# Patient Record
Sex: Female | Born: 1975 | Race: Black or African American | Hispanic: No | Marital: Single | Smoking: Never smoker
Health system: Southern US, Community
[De-identification: ages and names within clinical notes are randomized; demographics above are authoritative.]

## PROBLEM LIST (undated history)

## (undated) DIAGNOSIS — B009 Herpesviral infection, unspecified: Secondary | ICD-10-CM

## (undated) DIAGNOSIS — D649 Anemia, unspecified: Secondary | ICD-10-CM

## (undated) DIAGNOSIS — R51 Headache: Secondary | ICD-10-CM

## (undated) DIAGNOSIS — Z8051 Family history of malignant neoplasm of kidney: Secondary | ICD-10-CM

## (undated) DIAGNOSIS — M1990 Unspecified osteoarthritis, unspecified site: Secondary | ICD-10-CM

## (undated) DIAGNOSIS — Z803 Family history of malignant neoplasm of breast: Secondary | ICD-10-CM

## (undated) DIAGNOSIS — Z8052 Family history of malignant neoplasm of bladder: Secondary | ICD-10-CM

## (undated) DIAGNOSIS — E785 Hyperlipidemia, unspecified: Secondary | ICD-10-CM

## (undated) DIAGNOSIS — K219 Gastro-esophageal reflux disease without esophagitis: Secondary | ICD-10-CM

## (undated) DIAGNOSIS — S60551A Superficial foreign body of right hand, initial encounter: Secondary | ICD-10-CM

## (undated) DIAGNOSIS — N200 Calculus of kidney: Secondary | ICD-10-CM

## (undated) DIAGNOSIS — I10 Essential (primary) hypertension: Secondary | ICD-10-CM

## (undated) DIAGNOSIS — Z8042 Family history of malignant neoplasm of prostate: Secondary | ICD-10-CM

## (undated) DIAGNOSIS — R87619 Unspecified abnormal cytological findings in specimens from cervix uteri: Secondary | ICD-10-CM

## (undated) DIAGNOSIS — Z8041 Family history of malignant neoplasm of ovary: Secondary | ICD-10-CM

## (undated) DIAGNOSIS — N870 Mild cervical dysplasia: Secondary | ICD-10-CM

## (undated) DIAGNOSIS — E119 Type 2 diabetes mellitus without complications: Secondary | ICD-10-CM

## (undated) DIAGNOSIS — M65839 Other synovitis and tenosynovitis, unspecified forearm: Secondary | ICD-10-CM

## (undated) HISTORY — DX: Family history of malignant neoplasm of bladder: Z80.52

## (undated) HISTORY — DX: Herpesviral infection, unspecified: B00.9

## (undated) HISTORY — DX: Unspecified abnormal cytological findings in specimens from cervix uteri: R87.619

## (undated) HISTORY — DX: Family history of malignant neoplasm of ovary: Z80.41

## (undated) HISTORY — DX: Family history of malignant neoplasm of breast: Z80.3

## (undated) HISTORY — DX: Anemia, unspecified: D64.9

## (undated) HISTORY — DX: Calculus of kidney: N20.0

## (undated) HISTORY — DX: Type 2 diabetes mellitus without complications: E11.9

## (undated) HISTORY — DX: Family history of malignant neoplasm of kidney: Z80.51

## (undated) HISTORY — DX: Hyperlipidemia, unspecified: E78.5

## (undated) HISTORY — DX: Family history of malignant neoplasm of prostate: Z80.42

## (undated) HISTORY — PX: CYST EXCISION: SHX5701

---
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
## 1997-11-05 ENCOUNTER — Emergency Department (HOSPITAL_COMMUNITY): Admission: EM | Admit: 1997-11-05 | Discharge: 1997-11-05 | Payer: Self-pay | Admitting: Emergency Medicine

## 2008-01-04 ENCOUNTER — Emergency Department (HOSPITAL_COMMUNITY): Admission: EM | Admit: 2008-01-04 | Discharge: 2008-01-04 | Payer: Self-pay | Admitting: Family Medicine

## 2011-03-17 ENCOUNTER — Inpatient Hospital Stay (INDEPENDENT_AMBULATORY_CARE_PROVIDER_SITE_OTHER)
Admission: RE | Admit: 2011-03-17 | Discharge: 2011-03-17 | Disposition: A | Discharge status: Home / Self Care | Payer: Worker's Compensation | Source: Ambulatory Visit | Attending: Family Medicine | Admitting: Family Medicine

## 2011-03-17 ENCOUNTER — Ambulatory Visit (INDEPENDENT_AMBULATORY_CARE_PROVIDER_SITE_OTHER): Payer: Worker's Compensation

## 2011-03-17 DIAGNOSIS — S93409A Sprain of unspecified ligament of unspecified ankle, initial encounter: Secondary | ICD-10-CM

## 2011-07-30 DIAGNOSIS — M65839 Other synovitis and tenosynovitis, unspecified forearm: Secondary | ICD-10-CM

## 2011-07-30 DIAGNOSIS — S60551A Superficial foreign body of right hand, initial encounter: Secondary | ICD-10-CM

## 2011-07-30 HISTORY — DX: Other synovitis and tenosynovitis, unspecified forearm: M65.839

## 2011-07-30 HISTORY — DX: Superficial foreign body of right hand, initial encounter: S60.551A

## 2011-08-20 ENCOUNTER — Encounter (HOSPITAL_BASED_OUTPATIENT_CLINIC_OR_DEPARTMENT_OTHER): Payer: Self-pay | Admitting: *Deleted

## 2011-08-20 NOTE — Pre-Procedure Instructions (Signed)
To come for BMET 

## 2011-08-24 ENCOUNTER — Encounter (HOSPITAL_BASED_OUTPATIENT_CLINIC_OR_DEPARTMENT_OTHER): Payer: Self-pay | Admitting: *Deleted

## 2011-08-24 NOTE — Pre-Procedure Instructions (Signed)
To come for BMET and EKG 

## 2011-08-25 ENCOUNTER — Other Ambulatory Visit: Payer: Self-pay

## 2011-08-25 ENCOUNTER — Encounter (HOSPITAL_BASED_OUTPATIENT_CLINIC_OR_DEPARTMENT_OTHER)
Admission: RE | Admit: 2011-08-25 | Discharge: 2011-08-25 | Disposition: A | Discharge status: Home / Self Care | Payer: 59 | Source: Ambulatory Visit | Attending: Orthopedic Surgery | Admitting: Orthopedic Surgery

## 2011-08-25 ENCOUNTER — Other Ambulatory Visit: Payer: Self-pay | Admitting: Orthopedic Surgery

## 2011-08-25 LAB — BASIC METABOLIC PANEL
BUN: 13 mg/dL (ref 6–23)
Calcium: 8.7 mg/dL (ref 8.4–10.5)
GFR calc Af Amer: >90 mL/min (ref >90)
GFR calc non Af Amer: >90 mL/min (ref >90)
Potassium: 4.1 mEq/L (ref 3.5–5.1)
Sodium: 135 mEq/L (ref 135–145)

## 2011-08-25 NOTE — H&P (Signed)
Linda Conrad is an 36 y.o. female.   Chief Complaint: c/o pain radial aspect right wrist and longstanding FB in right palm. HPI: Linda Conrad presents with acute deQuervain's stenosing tenosynovitis of the right wrist and painful metal foreign body in her palm. As a child she had a wire break in her palm overlying the index MCP joint. She has lived with this for many years but has now developed reactive inflammation. She has STS of her right first dorsal compartment that we treated with an injection and splinting in 2009. She now has a cyst in her first dorsal compartment and acute symptoms.    Past Medical History  Diagnosis Date  . Headache     sinus  . Arthritis     hand  . GERD (gastroesophageal reflux disease)     no current med.  . 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 Foreign body of hand, right 07/2011    right MP  . Hypertension     denies HTN, but takes Hyzaar     Past Surgical History  Procedure Date  . No past surgeries     History reviewed. No pertinent family history. Social History:  reports that she has never smoked. She has never used smokeless tobacco. She reports that she drinks alcohol. She reports that she does not use illicit drugs.  Allergies:  Allergies  Allergen Reactions  . Soap Rash    No current facility-administered medications on file as of .   Medications Prior to Admission  Medication Sig Dispense Refill  . carboxymethylcellulose 1 % ophthalmic solution 1 drop 3 (three) times daily.      . fluticasone (VERAMYST) 27.5 MCG/SPRAY nasal spray Place 2 sprays into the nose daily.      Marland Kitchen losartan-hydrochlorothiazide (HYZAAR) 50-12.5 MG per tablet Take 1 tablet by mouth daily. AM      . meloxicam (MOBIC) 15 MG tablet Take 15 mg by mouth daily.      . naproxen sodium (ANAPROX) 220 MG tablet Take 220 mg by mouth 2 (two) times daily with a meal.        No results found for this or any previous visit (from the past 48  hour(s)).  No results found.  Pertinent items are noted in HPI.  Height 5\' 6"  (1.676 m), weight 66.679 kg (147 lb), last menstrual period 08/19/2011.  General appearance: alert, cooperative, appears stated age and no distress Head: Normocephalic, without obvious abnormality, atraumatic Neck: no adenopathy, no carotid bruit, no JVD, supple, symmetrical, trachea midline and thyroid not enlarged, symmetric, no tenderness/mass/nodules Resp: clear to auscultation bilaterally Cardio: regular rate and rhythm, S1, S2 normal, no murmur, click, rub or gallop GI: soft, non-tender; bowel sounds normal; no masses,  no organomegaly Extremities:  Inspection of her hand and wrist reveals visible inflammation and swelling over the index at the MCP joint and right palm. She is very tender to touch. She does not show signs of STS of her index finger. She has full ROM of her wrist and fingers but has a positive Finkelstein's maneuver. She is markedly tender on palpation over the first dorsal compartment on the right.  Plain x-rays demonstrate multiple fragments of wire overlying the index metacarpal head with 3 visible fragments on her x-ray.  Pulses: 2+ and symmetric Skin: Skin color, texture, turgor normal. No rashes or lesions Neurologic: Grossly normal    Assessment/Plan Assessment: (1) STS right  first dorsal compartment with cyst formation. (2) Painful  inflammation surrounding 3 wire fragments right palm overlying index metacarpal head.  Plan: We will schedule a procedure under regional block to excise her foreign body fragments and granuloma and release the first dorsal compartment cystic lesion. The surgery, after care, risks and benefits were described in detail. We will schedule her at a mutually convenient time in the near future.     DASNOIT,Linda Conrad 08/25/2011, 10:24 AM  H&P documentation: 08/26/2011  -History and Physical Reviewed  -Patient has been re-examined  -No change in the plan  of care  Linda Forster, MD

## 2011-08-26 ENCOUNTER — Encounter (HOSPITAL_BASED_OUTPATIENT_CLINIC_OR_DEPARTMENT_OTHER): Payer: Self-pay | Admitting: Anesthesiology

## 2011-08-26 ENCOUNTER — Encounter (HOSPITAL_BASED_OUTPATIENT_CLINIC_OR_DEPARTMENT_OTHER)
Admission: RE | Disposition: A | Discharge status: Home / Self Care | Payer: Self-pay | Source: Ambulatory Visit | Attending: Orthopedic Surgery

## 2011-08-26 ENCOUNTER — Ambulatory Visit (HOSPITAL_BASED_OUTPATIENT_CLINIC_OR_DEPARTMENT_OTHER): Payer: 59 | Admitting: Anesthesiology

## 2011-08-26 ENCOUNTER — Encounter (HOSPITAL_BASED_OUTPATIENT_CLINIC_OR_DEPARTMENT_OTHER): Payer: Self-pay | Admitting: *Deleted

## 2011-08-26 ENCOUNTER — Ambulatory Visit (HOSPITAL_BASED_OUTPATIENT_CLINIC_OR_DEPARTMENT_OTHER)
Admission: RE | Admit: 2011-08-26 | Discharge: 2011-08-26 | Disposition: A | Discharge status: Home / Self Care | Payer: 59 | Source: Ambulatory Visit | Attending: Orthopedic Surgery | Admitting: Orthopedic Surgery

## 2011-08-26 DIAGNOSIS — S60459A Superficial foreign body of unspecified finger, initial encounter: Secondary | ICD-10-CM | POA: Insufficient documentation

## 2011-08-26 DIAGNOSIS — X58XXXA Exposure to other specified factors, initial encounter: Secondary | ICD-10-CM | POA: Insufficient documentation

## 2011-08-26 DIAGNOSIS — Z01810 Encounter for preprocedural cardiovascular examination: Secondary | ICD-10-CM | POA: Insufficient documentation

## 2011-08-26 DIAGNOSIS — I10 Essential (primary) hypertension: Secondary | ICD-10-CM | POA: Insufficient documentation

## 2011-08-26 DIAGNOSIS — K219 Gastro-esophageal reflux disease without esophagitis: Secondary | ICD-10-CM | POA: Insufficient documentation

## 2011-08-26 DIAGNOSIS — Z01812 Encounter for preprocedural laboratory examination: Secondary | ICD-10-CM | POA: Insufficient documentation

## 2011-08-26 DIAGNOSIS — M654 Radial styloid tenosynovitis [de Quervain]: Secondary | ICD-10-CM | POA: Insufficient documentation

## 2011-08-26 HISTORY — DX: Unspecified osteoarthritis, unspecified site: M19.90

## 2011-08-26 HISTORY — PX: DORSAL COMPARTMENT RELEASE: SHX5039

## 2011-08-26 HISTORY — DX: Gastro-esophageal reflux disease without esophagitis: K21.9

## 2011-08-26 HISTORY — DX: Headache: R51

## 2011-08-26 HISTORY — DX: Superficial foreign body of right hand, initial encounter: S60.551A

## 2011-08-26 HISTORY — DX: Essential (primary) hypertension: I10

## 2011-08-26 HISTORY — DX: Other synovitis and tenosynovitis, unspecified forearm: M65.839

## 2011-08-26 HISTORY — PX: FOREIGN BODY REMOVAL: SHX962

## 2011-08-26 LAB — POCT HEMOGLOBIN-HEMACUE: Hemoglobin: 11.3 g/dL — ABNORMAL LOW (ref 12.0–15.0)

## 2011-08-26 SURGERY — RELEASE, FIRST DORSAL COMPARTMENT, HAND
Anesthesia: Regional | Site: Hand | Laterality: Right | Wound class: Clean

## 2011-08-26 MED ORDER — LIDOCAINE HCL 2 % IJ SOLN
INTRAMUSCULAR | Status: DC | PRN
  Administered 2011-08-26: 5 mL

## 2011-08-26 MED ORDER — FENTANYL CITRATE 0.05 MG/ML IJ SOLN
INTRAMUSCULAR | Status: DC | PRN
  Administered 2011-08-26: 50 ug via INTRAVENOUS

## 2011-08-26 MED ORDER — ONDANSETRON HCL 4 MG/2ML IJ SOLN
INTRAMUSCULAR | Status: DC | PRN
  Administered 2011-08-26: 4 mg via INTRAVENOUS

## 2011-08-26 MED ORDER — ONDANSETRON HCL 4 MG/2ML IJ SOLN: 4.0000 mg | Freq: Four times a day (QID) | INTRAMUSCULAR | Status: DC | PRN

## 2011-08-26 MED ORDER — HYDROMORPHONE HCL PF 1 MG/ML IJ SOLN: 0.2500 mg | INTRAMUSCULAR | Status: DC | PRN

## 2011-08-26 MED ORDER — LACTATED RINGERS IV SOLN
INTRAVENOUS | Status: DC
  Administered 2011-08-26 (×3): via INTRAVENOUS

## 2011-08-26 MED ORDER — CHLORHEXIDINE GLUCONATE 4 % EX LIQD: 60.0000 mL | Freq: Once | CUTANEOUS | Status: DC

## 2011-08-26 MED ORDER — MIDAZOLAM HCL 5 MG/5ML IJ SOLN
INTRAMUSCULAR | Status: DC | PRN
  Administered 2011-08-26: 2 mg via INTRAVENOUS

## 2011-08-26 MED ORDER — HYDROCODONE-ACETAMINOPHEN 5-500 MG PO CAPS: 1.0000 | ORAL_CAPSULE | Freq: Four times a day (QID) | ORAL | Status: AC | PRN

## 2011-08-26 MED ORDER — PROPOFOL 10 MG/ML IV EMUL
INTRAVENOUS | Status: DC | PRN
  Administered 2011-08-26: 75 ug/kg/min via INTRAVENOUS

## 2011-08-26 SURGICAL SUPPLY — 53 items
BANDAGE ADHESIVE 1X3 (GAUZE/BANDAGES/DRESSINGS) IMPLANT
BANDAGE COBAN STERILE 2 (GAUZE/BANDAGES/DRESSINGS) IMPLANT
BANDAGE ELASTIC 3 VELCRO ST LF (GAUZE/BANDAGES/DRESSINGS) ×2 IMPLANT
BANDAGE GAUZE ELAST BULKY 4 IN (GAUZE/BANDAGES/DRESSINGS) ×1 IMPLANT
BLADE MINI RND TIP GREEN BEAV (BLADE) IMPLANT
BLADE SURG 15 STRL LF DISP TIS (BLADE) ×1 IMPLANT
BLADE SURG 15 STRL SS (BLADE) ×2
BNDG CMPR 9X4 STRL LF SNTH (GAUZE/BANDAGES/DRESSINGS)
BNDG COHESIVE 1X5 TAN STRL LF (GAUZE/BANDAGES/DRESSINGS) IMPLANT
BNDG ESMARK 4X9 LF (GAUZE/BANDAGES/DRESSINGS) IMPLANT
BRUSH SCRUB EZ PLAIN DRY (MISCELLANEOUS) ×2 IMPLANT
CLOTH BEACON ORANGE TIMEOUT ST (SAFETY) ×2 IMPLANT
CORDS BIPOLAR (ELECTRODE) IMPLANT
COVER MAYO STAND STRL (DRAPES) ×2 IMPLANT
COVER TABLE BACK 60X90 (DRAPES) ×2 IMPLANT
CUFF TOURNIQUET SINGLE 18IN (TOURNIQUET CUFF) ×1 IMPLANT
DECANTER SPIKE VIAL GLASS SM (MISCELLANEOUS) IMPLANT
DRAIN PENROSE 1/2X12 LTX STRL (WOUND CARE) IMPLANT
DRAIN PENROSE 1/4X12 LTX STRL (WOUND CARE) IMPLANT
DRAPE EXTREMITY T 121X128X90 (DRAPE) ×2 IMPLANT
DRAPE SURG 17X23 STRL (DRAPES) ×2 IMPLANT
DRSG TEGADERM 4X4.75 (GAUZE/BANDAGES/DRESSINGS) IMPLANT
GAUZE XEROFORM 1X8 LF (GAUZE/BANDAGES/DRESSINGS) IMPLANT
GLOVE BIOGEL M STRL SZ7.5 (GLOVE) ×2 IMPLANT
GLOVE ORTHO TXT STRL SZ7.5 (GLOVE) ×2 IMPLANT
GOWN PREVENTION PLUS XLARGE (GOWN DISPOSABLE) ×2 IMPLANT
GOWN PREVENTION PLUS XXLARGE (GOWN DISPOSABLE) ×4 IMPLANT
NDL BLUNT 17GA (NEEDLE) IMPLANT
NEEDLE 27GAX1X1/2 (NEEDLE) IMPLANT
NEEDLE BLUNT 17GA (NEEDLE) IMPLANT
NS IRRIG 1000ML POUR BTL (IV SOLUTION) IMPLANT
PACK BASIN DAY SURGERY FS (CUSTOM PROCEDURE TRAY) ×2 IMPLANT
PAD CAST 3X4 CTTN HI CHSV (CAST SUPPLIES) IMPLANT
PADDING CAST ABS 4INX4YD NS (CAST SUPPLIES) ×1
PADDING CAST ABS COTTON 4X4 ST (CAST SUPPLIES) ×1 IMPLANT
PADDING CAST COTTON 3X4 STRL (CAST SUPPLIES)
SLEEVE SCD COMPRESS KNEE MED (MISCELLANEOUS) IMPLANT
SPONGE GAUZE 4X4 12PLY (GAUZE/BANDAGES/DRESSINGS) ×2 IMPLANT
STOCKINETTE 4X48 STRL (DRAPES) ×2 IMPLANT
STRIP CLOSURE SKIN 1/2X4 (GAUZE/BANDAGES/DRESSINGS) ×2 IMPLANT
SUT ETHILON 5 0 P 3 18 (SUTURE) ×1
SUT MERSILENE 4 0 P 3 (SUTURE) IMPLANT
SUT NYLON ETHILON 5-0 P-3 1X18 (SUTURE) ×1 IMPLANT
SUT PROLENE 3 0 PS 2 (SUTURE) ×2 IMPLANT
SUT VIC AB 4-0 P-3 18XBRD (SUTURE) IMPLANT
SUT VIC AB 4-0 P3 18 (SUTURE)
SYR 20CC LL (SYRINGE) IMPLANT
SYR 3ML 23GX1 SAFETY (SYRINGE) IMPLANT
SYR CONTROL 10ML LL (SYRINGE) IMPLANT
TOWEL OR 17X24 6PK STRL BLUE (TOWEL DISPOSABLE) ×4 IMPLANT
TRAY DSU PREP LF (CUSTOM PROCEDURE TRAY) ×2 IMPLANT
UNDERPAD 30X30 INCONTINENT (UNDERPADS AND DIAPERS) ×2 IMPLANT
WATER STERILE IRR 1000ML POUR (IV SOLUTION) ×2 IMPLANT

## 2011-08-26 NOTE — Op Note (Signed)
[id number] Op note dictated

## 2011-08-26 NOTE — Anesthesia Postprocedure Evaluation (Signed)
  Anesthesia Post-op Note  Patient: Linda Conrad  Procedure(s) Performed: Procedure(s) (LRB): RELEASE DORSAL COMPARTMENT (DEQUERVAIN) (Right) FOREIGN BODY REMOVAL ADULT (Right)  Patient Location: PACU  Anesthesia Type: MAC and Bier block  Level of Consciousness: sedated  Airway and Oxygen Therapy: Patient Spontanous Breathing  Post-op Pain: none  Post-op Assessment: Post-op Vital signs reviewed, Patient's Cardiovascular Status Stable, Respiratory Function Stable and Patent Airway  Post-op Vital Signs: Reviewed and stable  Complications: No apparent anesthesia complications

## 2011-08-26 NOTE — Transfer of Care (Signed)
Immediate Anesthesia Transfer of Care Note  Patient: Linda Conrad  Procedure(s) Performed: Procedure(s) (LRB): RELEASE DORSAL COMPARTMENT (DEQUERVAIN) (Right) FOREIGN BODY REMOVAL ADULT (Right)  Patient Location: PACU  Anesthesia Type: Bier block  Level of Consciousness: sedated  Airway & Oxygen Therapy: Patient Spontanous Breathing  Post-op Assessment: Report given to PACU RN and Post -op Vital signs reviewed and stable  Post vital signs: Reviewed and stable  Complications: No apparent anesthesia complications

## 2011-08-26 NOTE — Op Note (Signed)
NAME:  Linda Conrad, Linda Conrad NO.:  [id number]  MEDICAL RECORD NO.:  [id number]  LOCATION:  URG                          FACILITY:  [facility]  PHYSICIAN:  Katy Fitch. Starsha Morning, M.D. DATE OF BIRTH:  01-22-1976  DATE OF PROCEDURE:  08/26/2011 DATE OF DISCHARGE:  08/26/2011                              OPERATIVE REPORT   PREOPERATIVE DIAGNOSES: 1. Painful first dorsal compartment stenosing tenosynovitis, right     wrist. 2. 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 3. Multiple metallic retained foreign bodies overlying right index     finger, metacarpophalangeal joint, deep to palmar fascia.  POSTOPERATIVE DIAGNOSES: 1. Painful first dorsal compartment stenosing tenosynovitis, right     wrist. 2. 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 3. Multiple metallic retained foreign bodies overlying right index     finger, metacarpophalangeal joint, deep to palmar fascia.Marland Kitchen  OPERATION: 1. Resection of myxoid cyst, right first dorsal compartment extensor     retinaculum. 2. Release of first dorsal compartment with removal of large septum     separating extensor pollicis brevis and abductor pollicis longus     tendons. 3. Removal of 3 metallic encapsulated foreign bodies at right index     finger metacarpophalangeal joint palmar aspect.  OPERATING SURGEON:  Katy Fitch. Kendall Arnell, M.D.  ASSISTANT:  No assistant.  ANESTHESIA:  IV Regional.  SUPERVISING ANESTHESIOLOGIST:  Achille Rich, MD  INDICATIONS:  Linda Conrad is a 36 year old woman referred by Dr. Andi Devon for evaluation and management of a painful knot on the radial aspect of her right wrist and signs of stenosing tenosynovitis.  She failed nonoperative measures.  She also noted metallic foreign bodies that had been present for a while and palmar skin overlying the right index finger metacarpophalangeal joint.  We advised Linda Conrad to proceed with cyst excision, first dorsal  compartment released, and removal of the foreign bodies under regional general anesthesia.  After informed consent, she chose regional anesthesia.  Preoperatively, she was interviewed by Dr. Chaney Malling of anesthesia.  He recommended intravenous regional.  In room 6 under Dr. Seward Meth direct supervision, an IV regional block was placed with a tourniquet at 300 mmHg.  The right arm was then prepped with Betadine soap and solution, sterilely draped.  When anesthesia satisfactory, a routine surgical time- out was accomplished.  We subsequently proceeded to infiltrate 2% lidocaine into the path intended incision at the radial aspect of the wrist.  A transverse incision was fashioned directly over the palpable 5 mm diameter mass at the 1st dorsal compartment.  Subcutaneous tissues were carefully divided taking care to identify the radial sensory branches.  These were gently retracted with a Ragnell retractors.  The cyst was excised from retinaculum with a rongeur.  The retinaculum was then split revealing a pair of slips of extensor pollicis brevis tendon in a separate dorsal compartment.  Dissection then revealed a more palmar compartment with 2 slips of the abductor pollicis longus.  Thick septum between the 2 compartments was resected with a rongeur and scissors dissection.  The wound was inspected for bleeding points, subsequently paired with intradermal 3-0 Prolene and Steri-Strips.  Attention was then directed to the palm.  Pre-infiltration with 2% lidocaine was accomplished for total volume between both wounds of 5 mL of 2% plain lidocaine.  An oblique incision was fashioned in the thenar crease, which exposed the area of the discolored subcutaneous masses.  The palmar fascia was released, and the mass was circumferentially dissected, near the flexor sheath.  There was 1 large mass that was encapsulated and 2 smaller masses that were also encapsulated.  These appeared to be  ferrous. There was quite a bit of rust staining of the encapsulating granuloma.  All 3 masses removed.  The wound was then inspected for bleeding points followed by repair of the skin with intradermal 3-0 Prolene suture and Steri-Strips.  The hand was dressed with a voluminous gauze dressing of sterile 4 x 4, sterile Kerlix, and Ace wrap.  No apparent complications.  Linda Conrad tolerated surgery and anesthesia well.  She was transferred to recovery room in stable signs.     Katy Fitch Faelyn Sigler, M.D.     RVS/MEDQ  D:  08/26/2011  T:  08/26/2011  Job:  [id number]  cc:   Merlene Laughter. Renae Gloss, M.D.

## 2011-08-26 NOTE — Anesthesia Preprocedure Evaluation (Signed)
Anesthesia Evaluation  Patient identified by MRN, date of birth, ID band Patient awake    Reviewed: Allergy & Precautions, H&P , NPO status , Patient's Chart, lab work & pertinent test results  Airway Mallampati: II  Neck ROM: full    Dental   Pulmonary          Cardiovascular hypertension,     Neuro/Psych  Headaches,    GI/Hepatic GERD-  ,  Endo/Other    Renal/GU      Musculoskeletal   Abdominal   Peds  Hematology   Anesthesia Other Findings   Reproductive/Obstetrics                           Anesthesia Physical Anesthesia Plan  ASA: II  Anesthesia Plan: Bier Block   Post-op Pain Management:    Induction:   Airway Management Planned: Simple Face Mask  Additional Equipment:   Intra-op Plan:   Post-operative Plan:   Informed Consent: I have reviewed the patients History and Physical, chart, labs and discussed the procedure including the risks, benefits and alternatives for the proposed anesthesia with the patient or authorized representative who has indicated his/her understanding and acceptance.     Plan Discussed with: CRNA and Surgeon  Anesthesia Plan Comments:         Anesthesia Quick Evaluation

## 2011-08-26 NOTE — Brief Op Note (Signed)
08/26/2011  9:29 AM  PATIENT:  Linda Conrad  36 y.o. female  PRE-OPERATIVE DIAGNOSIS:  sts of first dorsal compartment on right. foreign body index mp on the right   POST-OPERATIVE DIAGNOSIS:  sts of first dorsal compartment on  right. foreign body index mp right  PROCEDURE:   RELEASE DORSAL COMPARTMENT (DEQUERVAIN) (Right) FOREIGN BODY REMOVAL ADULT (Right) index metacarpal palmar region  SURGEON: Wyn Forster., MD   PHYSICIAN ASSISTANT:   ASSISTANTS: none   ANESTHESIA:   regional  EBL:  Total I/O In: 1000 [I.V.:1000] Out: -   BLOOD ADMINISTERED:none  DRAINS: none   LOCAL MEDICATIONS USED:  XYLOCAINE 5 cc 2%  SPECIMEN:  No Specimen  DISPOSITION OF SPECIMEN:  N/A  COUNTS:  YES  TOURNIQUET:  * Missing tourniquet times found for documented tourniquets in log:  29410 *  DICTATION: .Other Dictation: Dictation Number [id number]  PLAN OF CARE: Discharge to home after PACU  PATIENT DISPOSITION:  PACU - hemodynamically stable.

## 2011-08-26 NOTE — Discharge Instructions (Signed)

## 2011-08-30 ENCOUNTER — Encounter (HOSPITAL_BASED_OUTPATIENT_CLINIC_OR_DEPARTMENT_OTHER): Payer: Self-pay | Admitting: Orthopedic Surgery

## 2012-06-01 LAB — HM PAP SMEAR

## 2012-06-07 ENCOUNTER — Other Ambulatory Visit: Payer: Self-pay | Admitting: Internal Medicine

## 2012-06-07 DIAGNOSIS — Z1231 Encounter for screening mammogram for malignant neoplasm of breast: Secondary | ICD-10-CM

## 2012-06-14 ENCOUNTER — Ambulatory Visit
Admission: RE | Admit: 2012-06-14 | Discharge: 2012-06-14 | Disposition: A | Discharge status: Home / Self Care | Payer: 59 | Source: Ambulatory Visit | Attending: Internal Medicine | Admitting: Internal Medicine

## 2012-06-14 DIAGNOSIS — Z1231 Encounter for screening mammogram for malignant neoplasm of breast: Secondary | ICD-10-CM

## 2012-06-14 LAB — HM MAMMOGRAPHY: HM Mammogram: NEGATIVE

## 2013-01-30 LAB — HM DIABETES EYE EXAM

## 2013-11-16 ENCOUNTER — Encounter: Payer: Self-pay | Admitting: Family Medicine

## 2013-11-16 ENCOUNTER — Ambulatory Visit (INDEPENDENT_AMBULATORY_CARE_PROVIDER_SITE_OTHER): Payer: 59 | Admitting: Family Medicine

## 2013-11-16 VITALS — BP 148/90 | HR 99 | Temp 98.7°F | Ht 66.75 in | Wt 198.0 lb

## 2013-11-16 DIAGNOSIS — R897 Abnormal histological findings in specimens from other organs, systems and tissues: Secondary | ICD-10-CM

## 2013-11-16 DIAGNOSIS — H9191 Unspecified hearing loss, right ear: Secondary | ICD-10-CM

## 2013-11-16 DIAGNOSIS — K219 Gastro-esophageal reflux disease without esophagitis: Secondary | ICD-10-CM | POA: Insufficient documentation

## 2013-11-16 DIAGNOSIS — E1129 Type 2 diabetes mellitus with other diabetic kidney complication: Secondary | ICD-10-CM

## 2013-11-16 DIAGNOSIS — H9190 Unspecified hearing loss, unspecified ear: Secondary | ICD-10-CM

## 2013-11-16 DIAGNOSIS — R87619 Unspecified abnormal cytological findings in specimens from cervix uteri: Secondary | ICD-10-CM

## 2013-11-16 DIAGNOSIS — I10 Essential (primary) hypertension: Secondary | ICD-10-CM

## 2013-11-16 DIAGNOSIS — D2270 Melanocytic nevi of unspecified lower limb, including hip: Secondary | ICD-10-CM

## 2013-11-16 DIAGNOSIS — D2370 Other benign neoplasm of skin of unspecified lower limb, including hip: Secondary | ICD-10-CM

## 2013-11-16 DIAGNOSIS — E1159 Type 2 diabetes mellitus with other circulatory complications: Secondary | ICD-10-CM | POA: Insufficient documentation

## 2013-11-16 DIAGNOSIS — I152 Hypertension secondary to endocrine disorders: Secondary | ICD-10-CM | POA: Insufficient documentation

## 2013-11-16 DIAGNOSIS — R87618 Other abnormal cytological findings on specimens from cervix uteri: Secondary | ICD-10-CM

## 2013-11-16 HISTORY — DX: Unspecified abnormal cytological findings in specimens from cervix uteri: R87.619

## 2013-11-16 LAB — BASIC METABOLIC PANEL
BUN: 11 mg/dL (ref 6–23)
CHLORIDE: 102 meq/L (ref 96–112)
CO2: 31 meq/L (ref 19–32)
CREATININE: 1 mg/dL (ref 0.4–1.2)
Calcium: 9.4 mg/dL (ref 8.4–10.5)
GFR: 76.61 mL/min (ref >60.00)
Glucose, Bld: 85 mg/dL (ref 70–99)
Potassium: 3.4 mEq/L — ABNORMAL LOW (ref 3.5–5.1)
Sodium: 137 mEq/L (ref 135–145)

## 2013-11-16 LAB — HEMOGLOBIN A1C: HEMOGLOBIN A1C: 5.7 % (ref 4.6–6.5)

## 2013-11-16 NOTE — Progress Notes (Signed)
No chief complaint on file.   HPI:  Linda Conrad is here to establish care. Prior PCP went [facility]. Mother is Linda Conrad. Last PCP and physical: last physical feb, 2015 - reports labs good at the time. Last pap 1 year ago and had hpv and advised recheck in 1 year.  Has the following chronic problems and concerns today:  Patient Active Problem List   Diagnosis Date Noted  . Diabetes mellitus with renal complications 10/93/2355  . Hypertension 11/16/2013  . GERD (gastroesophageal reflux disease) 11/16/2013  . Hearing loss - followed by [facility] 11/16/2013  . 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: -sees an orthopedic doctor for this and wrist cyst -had reaction to meloxicam - kidney issues -no severe pain at this time  DM: -worsened in end of 2014, treated with insulin for some time, then worked on lifestyle and much improvement and then weaned of insulin and decreased metformin in 07/2013 as hgba1c was 5.9 -denies: polyuria, polydipsia, wounds on feet -not checking home BS now -has eye exams yearly with Linda Conrad office  HTN: -on hxtz-lisinopril -denies: CP, soc, HA, swelling  Tinnitus: -seeing ENT with mild hearing loss - followed by [facility]  GERD: -stable on PPI -denies: heartburn, swallowing issues  Health Maintenance:  ROS: See pertinent positives and negatives per HPI.  Past Medical History  Diagnosis Date  . Headache(784.0)     sinus  . Arthritis     hand  . GERD (gastroesophageal reflux disease)     no current med.  . 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 Foreign body of hand, right 07/2011    right MP  . Hypertension     denies HTN, but takes Hyzaar   . Frequent headaches     Family History  Problem Relation Age of Onset  . Hyperlipidemia Mother   . Hypertension Mother   . Hypertension Father     History    Social History  . Marital Status: Single    Spouse Name: N/A    Number of Children: N/A  . Years of Education: N/A   Social History Main Topics  . Smoking status: Never Smoker   . Smokeless tobacco: Never Used  . Alcohol Use: Yes     Comment: occasionally  . Drug Use: No  . Sexual Activity: None   Other Topics Concern  . None   Social History Narrative   Work or School: works with senior center      Home Situation: lives alone      Spiritual Beliefs: none, Christian      Lifestyle: active at work but not otherwise; avoiding carbs             Current outpatient prescriptions:Aloe-Sodium Chloride (AYR SALINE NASAL GEL NA), Place into the nose., Disp: , Rfl: ;  FOLIC ACID PO, Take 732 mg by mouth daily., Disp: , Rfl: ;  losartan-hydrochlorothiazide (HYZAAR) 50-12.5 MG per tablet, Take 1 tablet by mouth daily. AM, Disp: , Rfl: ;  metFORMIN (GLUCOPHAGE) 1000 MG tablet, Take 1,000 mg by mouth 2 (two) times daily with a meal. , Disp: , Rfl:  omeprazole (PRILOSEC) 20 MG capsule, Take 20 mg by mouth daily. , Disp: , Rfl:   EXAM:  Filed Vitals:   11/16/13 1438  BP: 148/90  Pulse: 99  Temp: 98.7  F (37.1 C)    Body mass index is 31.26 kg/(m^2).  GENERAL: vitals reviewed and listed above, alert, oriented, appears well hydrated and in no acute distress  HEENT: atraumatic, conjunttiva clear, no obvious abnormalities on inspection of external nose and ears  NECK: no obvious masses on inspection  LUNGS: clear to auscultation bilaterally, no wheezes, rales or rhonchi, good air movement  CV: HRRR, no peripheral edema  MS: moves all extremities without noticeable abnormality  PSYCH: pleasant and cooperative, no obvious depression or anxiety  SKIN: on diabetic foot exam incidental noticed abnormal appearing moll on bottom of L foot - she reports always there but two other moles she thinks are new  ASSESSMENT AND PLAN:  Discussed the following assessment and  plan:  Type 2 diabetes mellitus with other diabetic kidney complication - Plan: Hemoglobin A1c -hgba1c today  Essential hypertension - Plan: Basic metabolic panel -recheck at follow up  Gastroesophageal reflux disease, esophagitis presence not specified  Hearing loss, right  Other abnormal cytological finding of specimen from cervix -she is to schedule pap smear  Abnormal appearing mole of foot -on diabetic foot exam she reports has always been there, with several other moles she reports are new -referral to derm as atypical appearance and location  -We reviewed the PMH, PSH, FH, SH, Meds and Allergies. -We provided refills for any medications we will prescribe as needed. -We addressed current concerns per orders and patient instructions. -We have asked for records for pertinent exams, studies, vaccines and notes from previous providers. -We have advised patient to follow up per instructions below.   -Patient advised to return or notify a doctor immediately if symptoms worsen or persist or new concerns arise.  Patient Instructions  -We have ordered labs or studies at this visit. It can take up to 1-2 weeks for results and processing. We will contact you with instructions IF your results are abnormal. Normal results will be released to your [facility]. If you have not heard from [place] or can not find your results in [facility] in 2 weeks please contact our office.  -you can use tylenol (acetaminophen) 500-1000mg  up to 3 times per day as needed (no antiinflammatory medications such as aleve, ibuprofen naproxen, etc) and see your hand doctor  -PLEASE SIGN UP FOR [place]   We recommend the following healthy lifestyle measures: - eat a healthy diet consisting of lots of vegetables, fruits, beans, nuts, seeds, healthy meats such as white chicken and fish and whole grains.  - avoid fried foods, fast food, processed foods, sodas, red meet and other fattening foods.  - get a least 150  minutes of aerobic exercise per week.   SCHEDULE PAP SMEAR  Follow up in: 3-4 months and as needed      Linda Conrad.

## 2013-11-16 NOTE — Patient Instructions (Addendum)
-  We have ordered labs or studies at this visit. It can take up to 1-2 weeks for results and processing. We will contact you with instructions IF your results are abnormal. Normal results will be released to your [facility]. If you have not heard from [place] or can not find your results in [facility] in 2 weeks please contact our office.  -you can use tylenol (acetaminophen) 500-1000mg  up to 3 times per day as needed (no antiinflammatory medications such as aleve, ibuprofen naproxen, etc) and see your hand doctor  -PLEASE SIGN UP FOR MYCHART TODAY   We recommend the following healthy lifestyle measures: - eat a healthy diet consisting of lots of vegetables, fruits, beans, nuts, seeds, healthy meats such as white chicken and fish and whole grains.  - avoid fried foods, fast food, processed foods, sodas, red meet and other fattening foods.  - get a least 150 minutes of aerobic exercise per week.   SCHEDULE PAP SMEAR  Follow up in: 3-4 months and as needed

## 2013-11-16 NOTE — Progress Notes (Signed)
Pre visit review using our clinic review tool, if applicable. No additional management support is needed unless otherwise documented below in the visit note. 

## 2013-11-17 ENCOUNTER — Telehealth: Payer: Self-pay | Admitting: Family Medicine

## 2013-11-17 NOTE — Telephone Encounter (Signed)
Relevant patient education mailed to patient.  

## 2013-12-26 ENCOUNTER — Other Ambulatory Visit: Payer: Self-pay | Admitting: Family Medicine

## 2013-12-26 DIAGNOSIS — E1129 Type 2 diabetes mellitus with other diabetic kidney complication: Secondary | ICD-10-CM

## 2014-01-02 ENCOUNTER — Other Ambulatory Visit (INDEPENDENT_AMBULATORY_CARE_PROVIDER_SITE_OTHER): Payer: 59

## 2014-01-02 ENCOUNTER — Encounter: Payer: Self-pay | Admitting: Family Medicine

## 2014-01-02 ENCOUNTER — Ambulatory Visit (INDEPENDENT_AMBULATORY_CARE_PROVIDER_SITE_OTHER): Payer: 59 | Admitting: Family Medicine

## 2014-01-02 VITALS — BP 132/94 | HR 91 | Temp 98.2°F | Ht 66.75 in | Wt 196.0 lb

## 2014-01-02 DIAGNOSIS — E119 Type 2 diabetes mellitus without complications: Secondary | ICD-10-CM

## 2014-01-02 DIAGNOSIS — J0100 Acute maxillary sinusitis, unspecified: Secondary | ICD-10-CM

## 2014-01-02 DIAGNOSIS — I10 Essential (primary) hypertension: Secondary | ICD-10-CM

## 2014-01-02 DIAGNOSIS — R51 Headache: Secondary | ICD-10-CM

## 2014-01-02 DIAGNOSIS — H9190 Unspecified hearing loss, unspecified ear: Secondary | ICD-10-CM

## 2014-01-02 DIAGNOSIS — K219 Gastro-esophageal reflux disease without esophagitis: Secondary | ICD-10-CM

## 2014-01-02 DIAGNOSIS — Z23 Encounter for immunization: Secondary | ICD-10-CM

## 2014-01-02 DIAGNOSIS — E1129 Type 2 diabetes mellitus with other diabetic kidney complication: Secondary | ICD-10-CM

## 2014-01-02 LAB — LIPID PANEL
CHOL/HDL RATIO: 2
Cholesterol: 155 mg/dL (ref 0–200)
HDL: 75 mg/dL (ref >39.00)
LDL CALC: 63 mg/dL (ref 0–99)
NonHDL: 80
TRIGLYCERIDES: 85 mg/dL (ref 0.0–149.0)
VLDL: 17 mg/dL (ref 0.0–40.0)

## 2014-01-02 LAB — MICROALBUMIN / CREATININE URINE RATIO
CREATININE, U: 119.4 mg/dL
Microalb Creat Ratio: 0.3 mg/g (ref 0.0–30.0)
Microalb, Ur: 0.4 mg/dL (ref 0.0–1.9)

## 2014-01-02 MED ORDER — DOXYCYCLINE HYCLATE 100 MG PO TABS: 100.0000 mg | ORAL_TABLET | Freq: Two times a day (BID) | ORAL | Status: DC

## 2014-01-02 MED ORDER — NORGESTIM-ETH ESTRAD TRIPHASIC 0.18/0.215/0.25 MG-35 MCG PO TABS: 1.0000 | ORAL_TABLET | Freq: Every day | ORAL | Status: DC

## 2014-01-02 NOTE — Progress Notes (Signed)
No chief complaint on file.   HPI:  Linda Conrad, a new patient to me 10/2013, has a history of frequent headaches and presents for an acute visit for:  1) Headaches: -hx of headaches -seem a little more frequent the last 2 weeks -relieved with tylenol -symptoms: sinus issues, PND, sneezing, headache R side of face, above eye and post occiput -denies: fever, vomiting, vision changes, weakness, numbness, head injury -she also is looking at her computer in an odd way - needs to move computer as is to the side which strains her neck  Follow up:  DM:  -worsened in end of 2014, treated with insulin for some time, then worked on lifestyle and much improvement and then weaned of insulin and decreased metformin in 07/2013 as hgba1c was 5.9  -denies: polyuria, polydipsia, wounds on feet  -not checking home BS now  -has eye exams yearly with Dr. Kellie Moor office  Lab Results  Component Value Date   HGBA1C 5.7 11/16/2013   HTN:  -on hxtz-lisinopril  -denies: CP, soc, HA, swelling   Tinnitus:  -seeing ENT with mild hearing loss - followed by [facility]   GERD:  -stable on PPI  -denies: heartburn, swallowing issues  Reported hx abnormal pap, told to repeat in 1 year -I advised to to schedule repeat pap at her initial visit with me 10/2013 -she is going to do this ROS: See pertinent positives and negatives per HPI.  Past Medical History  Diagnosis Date  . Headache(784.0)     sinus  . Arthritis     hand  . GERD (gastroesophageal reflux disease)     no current med.  . 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 Foreign body of hand, right 07/2011    right MP  . Hypertension     denies HTN, but takes Hyzaar   . Frequent headaches     Past Surgical History  Procedure Laterality Date  . No past surgeries    . Dorsal compartment release  08/26/2011    Procedure: RELEASE DORSAL COMPARTMENT (DEQUERVAIN);  Surgeon: Cammie Sickle., MD;  Location: [facility];  Service: Orthopedics;  Laterality: Right;  . Foreign body removal  08/26/2011    Procedure: FOREIGN BODY REMOVAL ADULT;  Surgeon: Cammie Sickle., MD;  Location: [place];  Service: Orthopedics;  Laterality: Right;    Family History  Problem Relation Age of Onset  . Hyperlipidemia Mother   . Hypertension Mother   . Hypertension Father     History   Social History  . Marital Status: Single    Spouse Name: N/A    Number of Children: N/A  . Years of Education: N/A   Social History Main Topics  . Smoking status: Never Smoker   . Smokeless tobacco: Never Used  . Alcohol Use: Yes     Comment: occasionally  . Drug Use: No  . Sexual Activity: None   Other Topics Concern  . None   Social History Narrative   Work or School: works with senior center      Home Situation: lives alone      Spiritual Beliefs: none, Christian      Lifestyle: active at work but not otherwise; avoiding carbs             Current outpatient prescriptions:acetaminophen (TYLENOL) 500 MG tablet, Take 500 mg by mouth every 6 (six) hours as needed., Disp: , Rfl: ;  Aloe-Sodium  Chloride (AYR SALINE NASAL GEL NA), Place into the nose., Disp: , Rfl: ;  FOLIC ACID PO, Take 324 mg by mouth daily., Disp: , Rfl: ;  losartan-hydrochlorothiazide (HYZAAR) 50-12.5 MG per tablet, Take 1 tablet by mouth daily. AM, Disp: , Rfl:  metFORMIN (GLUCOPHAGE) 1000 MG tablet, Take 1,000 mg by mouth 2 (two) times daily with a meal. , Disp: , Rfl: ;  omeprazole (PRILOSEC) 20 MG capsule, Take 20 mg by mouth daily. , Disp: , Rfl: ;  doxycycline (VIBRA-TABS) 100 MG tablet, Take 1 tablet (100 mg total) by mouth 2 (two) times daily., Disp: 20 tablet, Rfl: 0 Norgestimate-Ethinyl Estradiol Triphasic (ORTHO TRI-CYCLEN, 28,) 0.18/0.215/0.25 MG-35 MCG tablet, Take 1 tablet by mouth daily., Disp: 1 Package, Rfl: 11  EXAM:  Filed Vitals:   01/02/14 0806  BP: 132/94  Pulse: 91  Temp: 98.2 F (36.8 C)     Body mass index is 30.94 kg/(m^2).  GENERAL: vitals reviewed and listed above, alert, oriented, appears well hydrated and in no acute distress  HEENT: atraumatic, conjunttiva clear, PERRLA, no obvious abnormalities on inspection of external nose and ears, normal appearance of ear canals and TMs, white nasal congestion, mild post oropharyngeal erythema with PND, no tonsillar edema or exudate, no sinus TTP  NECK: no obvious masses on inspection, no meningeal signs  LUNGS: clear to auscultation bilaterally, no wheezes, rales or rhonchi, good air movement  CV: HRRR, no peripheral edema  MS: moves all extremities without noticeable abnormality TTP R post occipital muscles,  PSYCH: pleasant and cooperative, no obvious depression or anxiety  NEURO: CN II-XII grossly intact, finger to nose normal  ASSESSMENT AND PLAN:  Discussed the following assessment and plan:  Type II or unspecified type diabetes mellitus without mention of complication, not stated as uncontrolled - Plan: Microalbumin/Creatinine Ratio, Urine -lipid panel FASTING today  Gastroesophageal reflux disease, esophagitis presence not specified  Hearing loss, unspecified laterality  Acute maxillary sinusitis, recurrence not specified - Plan: doxycycline (VIBRA-TABS) 100 MG tablet  Headache(784.0) -likely tension from poor m pneumechanics at work and or sinusitis -tx with doxy and changing computer position with close follow up  Essential hypertension -recheck at follow up  -Patient advised to return or notify a doctor immediately if symptoms worsen or persist or new concerns arise.  Patient Instructions  -call to schedule appointment with the gynecologist  -move your computer so that neck is not strained  -get your eye exam as scheduled and have them fax [place] a report  -We have ordered labs or studies at this visit. It can take up to 1-2 weeks for results and processing. We will contact you with instructions  IF your results are abnormal. Normal results will be released to your [facility]. If you have not heard from [place] or can not find your results in [facility] in 2 weeks please contact our office.  -As we discussed, we have prescribed a new medication for you at this appointment. We discussed the common and serious potential adverse effects of this medication and you can review these and more with the pharmacist when you pick up your medication.  Please follow the instructions for use carefully and notify us immediately if you have any problems taking this medication.  -follow up in 1 month or sooner as needed            Nikya Busler R.

## 2014-01-02 NOTE — Addendum Note (Signed)
Addended by: Agnes Lawrence on: 01/02/2014 09:23 AM   Modules accepted: Orders

## 2014-01-02 NOTE — Progress Notes (Signed)
Pre visit review using our clinic review tool, if applicable. No additional management support is needed unless otherwise documented below in the visit note. 

## 2014-01-02 NOTE — Patient Instructions (Signed)
-  call to schedule appointment with the gynecologist  -move your computer so that neck is not strained  -get your eye exam as scheduled and have them fax [place] a report  -We have ordered labs or studies at this visit. It can take up to 1-2 weeks for results and processing. We will contact you with instructions IF your results are abnormal. Normal results will be released to your [facility]. If you have not heard from [place] or can not find your results in [facility] in 2 weeks please contact our office.  -As we discussed, we have prescribed a new medication for you at this appointment. We discussed the common and serious potential adverse effects of this medication and you can review these and more with the pharmacist when you pick up your medication.  Please follow the instructions for use carefully and notify us immediately if you have any problems taking this medication.  -follow up in 1 month or sooner as needed

## 2014-01-03 ENCOUNTER — Encounter: Payer: Self-pay | Admitting: Family Medicine

## 2014-01-03 ENCOUNTER — Ambulatory Visit (INDEPENDENT_AMBULATORY_CARE_PROVIDER_SITE_OTHER): Payer: 59 | Admitting: Family Medicine

## 2014-01-03 VITALS — BP 118/80 | HR 104 | Temp 97.9°F | Ht 66.75 in | Wt 195.0 lb

## 2014-01-03 DIAGNOSIS — T881XXA Other complications following immunization, not elsewhere classified, initial encounter: Secondary | ICD-10-CM

## 2014-01-03 DIAGNOSIS — T8062XA Other serum reaction due to vaccination, initial encounter: Secondary | ICD-10-CM

## 2014-01-03 NOTE — Progress Notes (Signed)
Pre visit review using our clinic review tool, if applicable. No additional management support is needed unless otherwise documented below in the visit note. 

## 2014-01-03 NOTE — Progress Notes (Signed)
No chief complaint on file.   HPI:  Acute visit for:  Local reaction to vaccine: -reports: erythema, swelling pain at injection site -denies: SOB, spreading of rash, fevers, swelling of face, neck or lips, fevers  ROS: See pertinent positives and negatives per HPI.  Past Medical History  Diagnosis Date  . Headache(784.0)     sinus  . Arthritis     hand  . GERD (gastroesophageal reflux disease)     no current med.  . 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 Foreign body of hand, right 07/2011    right MP  . Hypertension     denies HTN, but takes Hyzaar   . Frequent headaches     Past Surgical History  Procedure Laterality Date  . No past surgeries    . Dorsal compartment release  08/26/2011    Procedure: RELEASE DORSAL COMPARTMENT (DEQUERVAIN);  Surgeon: Cammie Sickle., MD;  Location: [facility];  Service: Orthopedics;  Laterality: Right;  . Foreign body removal  08/26/2011    Procedure: FOREIGN BODY REMOVAL ADULT;  Surgeon: Cammie Sickle., MD;  Location: [place];  Service: Orthopedics;  Laterality: Right;    Family History  Problem Relation Age of Onset  . Hyperlipidemia Mother   . Hypertension Mother   . Hypertension Father     History   Social History  . Marital Status: Single    Spouse Name: N/A    Number of Children: N/A  . Years of Education: N/A   Social History Main Topics  . Smoking status: Never Smoker   . Smokeless tobacco: Never Used  . Alcohol Use: Yes     Comment: occasionally  . Drug Use: No  . Sexual Activity: None   Other Topics Concern  . None   Social History Narrative   Work or School: works with senior center      Home Situation: lives alone      Spiritual Beliefs: none, Christian      Lifestyle: active at work but not otherwise; avoiding carbs             Current outpatient prescriptions:acetaminophen (TYLENOL) 500 MG tablet, Take 500 mg by mouth every 6 (six) hours as  needed., Disp: , Rfl: ;  Aloe-Sodium Chloride (AYR SALINE NASAL GEL NA), Place into the nose., Disp: , Rfl: ;  doxycycline (VIBRA-TABS) 100 MG tablet, Take 1 tablet (100 mg total) by mouth 2 (two) times daily., Disp: 20 tablet, Rfl: 0;  FOLIC ACID PO, Take 008 mg by mouth daily., Disp: , Rfl:  losartan-hydrochlorothiazide (HYZAAR) 50-12.5 MG per tablet, Take 1 tablet by mouth daily. AM, Disp: , Rfl: ;  metFORMIN (GLUCOPHAGE) 1000 MG tablet, Take 1,000 mg by mouth 2 (two) times daily with a meal. , Disp: , Rfl: ;  Norgestimate-Ethinyl Estradiol Triphasic (ORTHO TRI-CYCLEN, 28,) 0.18/0.215/0.25 MG-35 MCG tablet, Take 1 tablet by mouth daily., Disp: 1 Package, Rfl: 11 omeprazole (PRILOSEC) 20 MG capsule, Take 20 mg by mouth daily. , Disp: , Rfl:   EXAM:  Filed Vitals:   01/03/14 1407  BP: 118/80  Pulse: 104  Temp: 97.9 F (36.6 C)    Body mass index is 30.79 kg/(m^2).  GENERAL: vitals reviewed and listed above, alert, oriented, appears well hydrated and in no acute distress  HEENT: atraumatic, conjunttiva clear, no obvious abnormalities on inspection of external nose and ears  NECK: no obvious masses on inspection  SKIN: area of  mild TTP and induration at  inj site, no signs of infection  MS: moves all extremities without noticeable abnormality  PSYCH: pleasant and cooperative, no obvious depression or anxiety  ASSESSMENT AND PLAN:  Discussed the following assessment and plan:  Local reaction to immunization, initial encounter  -no signs of infection, no symptoms or signs of systemic reaction -Patient advised to return or notify a doctor immediately if symptoms worsen or persist or new concerns arise.  There are no Patient Instructions on file for this visit.   Linda Benton R.

## 2014-01-07 ENCOUNTER — Other Ambulatory Visit: Payer: 59

## 2014-01-15 ENCOUNTER — Ambulatory Visit: Payer: 59 | Admitting: Family Medicine

## 2014-02-01 ENCOUNTER — Ambulatory Visit (INDEPENDENT_AMBULATORY_CARE_PROVIDER_SITE_OTHER): Payer: 59 | Admitting: Family Medicine

## 2014-02-01 ENCOUNTER — Encounter: Payer: Self-pay | Admitting: Family Medicine

## 2014-02-01 VITALS — BP 126/88 | HR 85 | Temp 98.3°F | Ht 66.75 in | Wt 199.0 lb

## 2014-02-01 DIAGNOSIS — I10 Essential (primary) hypertension: Secondary | ICD-10-CM

## 2014-02-01 DIAGNOSIS — J3089 Other allergic rhinitis: Secondary | ICD-10-CM

## 2014-02-01 DIAGNOSIS — R51 Headache: Secondary | ICD-10-CM

## 2014-02-01 DIAGNOSIS — E119 Type 2 diabetes mellitus without complications: Secondary | ICD-10-CM

## 2014-02-01 DIAGNOSIS — R519 Headache, unspecified: Secondary | ICD-10-CM

## 2014-02-01 LAB — SEDIMENTATION RATE: SED RATE: 11 mm/h (ref 0–22)

## 2014-02-01 NOTE — Patient Instructions (Signed)
-  We placed a referral for you as discussed for the MRI of the head. It usually takes about 1-2 weeks to process and schedule this referral. If you have not heard from [place] regarding this appointment in 2 weeks please contact our office.  -nasocort 2 sprays each nostril daily and Claritin daily  -keep headache journal, don't use over the counter pain medications more the 2 times per week  -get eye exam  -see gynecologist  -follow up in 1 month

## 2014-02-01 NOTE — Progress Notes (Signed)
No chief complaint on file.   HPI:  Follow up several issues:  HA/Sinusitis/Tension headaches: -seen last month for this -R sided almost daily headaches in R occipital region and R frontal head and pain in L frontal head, stabbing, tylenol helps -treated with doxy, proper work Office manager, advised to have eye exam -reports: has eye exam scheduled next week, has computer right, sinuses cleared up great, then for last week nasal congestion, PND -denies: vision changes, nausea, vomiting, severe pain, dizziness, weakness, fevers  HTN: -on hctz/lisionpril -reports: doing well -denies: CP, SOB, DOE -recheck today ROS: See pertinent positives and negatives per HPI.  Past Medical History  Diagnosis Date  . Headache(784.0)     sinus  . Arthritis     hand  . GERD (gastroesophageal reflux disease)     no current med.  . 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 Foreign body of hand, right 07/2011    right MP  . Hypertension     denies HTN, but takes Hyzaar   . Frequent headaches     Past Surgical History  Procedure Laterality Date  . No past surgeries    . Dorsal compartment release  08/26/2011    Procedure: RELEASE DORSAL COMPARTMENT (DEQUERVAIN);  Surgeon: Cammie Sickle., MD;  Location: [facility];  Service: Orthopedics;  Laterality: Right;  . Foreign body removal  08/26/2011    Procedure: FOREIGN BODY REMOVAL ADULT;  Surgeon: Cammie Sickle., MD;  Location: [place];  Service: Orthopedics;  Laterality: Right;    Family History  Problem Relation Age of Onset  . Hyperlipidemia Mother   . Hypertension Mother   . Hypertension Father     History   Social History  . Marital Status: Single    Spouse Name: N/A    Number of Children: N/A  . Years of Education: N/A   Social History Main Topics  . Smoking status: Never Smoker   . Smokeless tobacco: Never Used  . Alcohol Use: Yes     Comment: occasionally  . Drug Use: No  .  Sexual Activity: None   Other Topics Concern  . None   Social History Narrative   Work or School: works with senior center      Home Situation: lives alone      Spiritual Beliefs: none, Christian      Lifestyle: active at work but not otherwise; avoiding carbs             Current outpatient prescriptions:acetaminophen (TYLENOL) 500 MG tablet, Take 500 mg by mouth every 6 (six) hours as needed., Disp: , Rfl: ;  Aloe-Sodium Chloride (AYR SALINE NASAL GEL NA), Place into the nose., Disp: , Rfl: ;  FOLIC ACID PO, Take 793 mg by mouth daily., Disp: , Rfl: ;  losartan-hydrochlorothiazide (HYZAAR) 50-12.5 MG per tablet, Take 1 tablet by mouth daily. AM, Disp: , Rfl:  metFORMIN (GLUCOPHAGE) 1000 MG tablet, Take 1,000 mg by mouth 2 (two) times daily with a meal. , Disp: , Rfl: ;  Norgestimate-Ethinyl Estradiol Triphasic (ORTHO TRI-CYCLEN, 28,) 0.18/0.215/0.25 MG-35 MCG tablet, Take 1 tablet by mouth daily., Disp: 1 Package, Rfl: 11;  omeprazole (PRILOSEC) 20 MG capsule, Take 20 mg by mouth daily. , Disp: , Rfl:   EXAM:  Filed Vitals:   02/01/14 0904  BP: 126/88  Pulse: 85  Temp: 98.3 F (36.8 C)    Body mass index is 31.42 kg/(m^2).  GENERAL: vitals reviewed and  listed above, alert, oriented, appears well hydrated and in no acute distress  HEENT: atraumatic, conjunttiva clear, no obvious abnormalities on inspection of external nose and ears, normal appearance of ear canals and TMs, clear nasal congestion, mild post oropharyngeal erythema with PND, no tonsillar edema or exudate, no sinus TTP  NECK: no obvious masses on inspection, no bruit, supple, normal ROM head/neck, TTP R suboccp muscles  LUNGS: clear to auscultation bilaterally, no wheezes, rales or rhonchi, good air movement  CV: HRRR, no peripheral edema  MS: moves all extremities without noticeable abnormality  NEURO: PEERLA, CN II-XII grossly intact, finger to nose normal, gait normal  PSYCH: pleasant and cooperative, no  obvious depression or anxiety  ASSESSMENT AND PLAN:  Discussed the following assessment and plan:  Frequent headaches - Plan: Sedimentation Rate, MR MRA HEAD W WO CONTRAST -change in frequency and persisting daily headaches, respond to tyelnol and no other alarming features -we discussed possible serious and likely etiologies, workup and treatment, treatment risks and return precautions -after this discussion, Glorie opted for MRI, ESR -follow up advised 1 month with headaches journal -of course, we advised Wendell  to return or notify a doctor immediately if symptoms worsen or persist or new concerns arise.  AR: -flonase daily and anithastimine  Essential hypertension  Diabetes mellitus type 2, controlled  -Patient advised to return or notify a doctor immediately if symptoms worsen or persist or new concerns arise.  There are no Patient Instructions on file for this visit.   Colin Benton R.

## 2014-02-01 NOTE — Progress Notes (Signed)
Pre visit review using our clinic review tool, if applicable. No additional management support is needed unless otherwise documented below in the visit note. 

## 2014-02-06 ENCOUNTER — Other Ambulatory Visit: Payer: 59

## 2014-02-06 ENCOUNTER — Ambulatory Visit
Admission: RE | Admit: 2014-02-06 | Discharge: 2014-02-06 | Disposition: A | Discharge status: Home / Self Care | Payer: 59 | Source: Ambulatory Visit | Attending: Family Medicine | Admitting: Family Medicine

## 2014-02-06 DIAGNOSIS — R519 Headache, unspecified: Secondary | ICD-10-CM

## 2014-02-06 DIAGNOSIS — R51 Headache: Principal | ICD-10-CM

## 2014-02-07 ENCOUNTER — Other Ambulatory Visit: Payer: Self-pay | Admitting: Family Medicine

## 2014-02-07 NOTE — Progress Notes (Signed)
Per Neoma Laming the insurance denied the MRI-only the MRA was approved.  Message forwarded to Dr Maudie Mercury.

## 2014-02-08 ENCOUNTER — Other Ambulatory Visit: Payer: 59

## 2014-02-08 ENCOUNTER — Other Ambulatory Visit: Payer: Self-pay | Admitting: *Deleted

## 2014-02-08 ENCOUNTER — Telehealth: Payer: Self-pay | Admitting: Family Medicine

## 2014-02-08 DIAGNOSIS — R51 Headache: Principal | ICD-10-CM

## 2014-02-08 DIAGNOSIS — R519 Headache, unspecified: Secondary | ICD-10-CM

## 2014-02-08 NOTE — Telephone Encounter (Signed)
Pt would results of mri. The dr that did her head surgery (cyst removal) today wants to know the results. Dr Phebe Colla  Derm specialist  [phone]

## 2014-02-08 NOTE — Telephone Encounter (Signed)
Called and spoke with patient - MRI not done and office manager working on figuring out why and getting this rescheduled. Pt ok with this and doing ok - had epidermoid cyst removal from scalp today and derm ? Nerve issues regarding cyst - wants report of mri when done.

## 2014-02-12 ENCOUNTER — Other Ambulatory Visit: Payer: Self-pay | Admitting: *Deleted

## 2014-02-12 DIAGNOSIS — R519 Headache, unspecified: Secondary | ICD-10-CM

## 2014-02-12 DIAGNOSIS — R51 Headache: Principal | ICD-10-CM

## 2014-02-12 LAB — HM DIABETES EYE EXAM

## 2014-02-12 NOTE — Telephone Encounter (Signed)
Checked on this today. Hilda Blades, referral clerk working to get this on the schedule, issues with insurance company system down per her report, but back up today.

## 2014-02-13 ENCOUNTER — Encounter: Payer: Self-pay | Admitting: Family Medicine

## 2014-02-18 ENCOUNTER — Ambulatory Visit
Admission: RE | Admit: 2014-02-18 | Discharge: 2014-02-18 | Disposition: A | Discharge status: Home / Self Care | Payer: 59 | Source: Ambulatory Visit | Attending: Family Medicine | Admitting: Family Medicine

## 2014-02-18 DIAGNOSIS — R519 Headache, unspecified: Secondary | ICD-10-CM

## 2014-02-18 DIAGNOSIS — R51 Headache: Principal | ICD-10-CM

## 2014-02-18 MED ORDER — GADOBENATE DIMEGLUMINE 529 MG/ML IV SOLN
19.0000 mL | Freq: Once | INTRAVENOUS | Status: AC | PRN
  Administered 2014-02-18: 19 mL via INTRAVENOUS

## 2014-02-19 ENCOUNTER — Telehealth: Payer: Self-pay | Admitting: Family Medicine

## 2014-02-19 NOTE — Telephone Encounter (Signed)
Called pt to report MRI results. Headaches completely resolved the last few weeks. She saw opthomologist (Dr. Gershon Crane) on th 15th and exam was normal per her report. With completely resolved HAs, normal MRI and MRA otherwise, normal eye exam - empty sella likely not significant. Advised to let us know if headaches recur.  Arville Go, Pt asks that we fax MRI report to her dermatologist, please print and fax MRI to: Dr Phebe Colla Derm specialist [phone]

## 2014-02-19 NOTE — Telephone Encounter (Signed)
MRI report faxed to Dr Lynann Bologna at [phone].

## 2014-02-25 ENCOUNTER — Encounter: Payer: Self-pay | Admitting: Family Medicine

## 2014-02-28 ENCOUNTER — Other Ambulatory Visit: Payer: Self-pay | Admitting: Family Medicine

## 2014-03-08 ENCOUNTER — Ambulatory Visit: Payer: 59 | Admitting: Family Medicine

## 2014-03-15 ENCOUNTER — Encounter: Payer: Self-pay | Admitting: Family Medicine

## 2014-03-15 ENCOUNTER — Ambulatory Visit (INDEPENDENT_AMBULATORY_CARE_PROVIDER_SITE_OTHER): Payer: 59 | Admitting: Family Medicine

## 2014-03-15 VITALS — BP 136/88 | Temp 98.2°F | Wt 203.0 lb

## 2014-03-15 DIAGNOSIS — E119 Type 2 diabetes mellitus without complications: Secondary | ICD-10-CM

## 2014-03-15 DIAGNOSIS — R51 Headache: Secondary | ICD-10-CM

## 2014-03-15 DIAGNOSIS — R87618 Other abnormal cytological findings on specimens from cervix uteri: Secondary | ICD-10-CM

## 2014-03-15 DIAGNOSIS — K219 Gastro-esophageal reflux disease without esophagitis: Secondary | ICD-10-CM

## 2014-03-15 DIAGNOSIS — G8929 Other chronic pain: Secondary | ICD-10-CM

## 2014-03-15 DIAGNOSIS — Z23 Encounter for immunization: Secondary | ICD-10-CM

## 2014-03-15 DIAGNOSIS — R519 Headache, unspecified: Secondary | ICD-10-CM

## 2014-03-15 DIAGNOSIS — I10 Essential (primary) hypertension: Secondary | ICD-10-CM

## 2014-03-15 NOTE — Progress Notes (Signed)
Chief Complaint  Patient presents with  . Follow-up    HPI:  1) Headaches:  -resolved after cyst removal - was attached to cutaneous nerve in scalp per her report -obtained labs and MRI due to increased frequency 8/20155 - negative  -relieved with tylenol  -denies: fever, vomiting, vision changes, weakness, numbness, head injury   DM:  -worsened in end of 2014, treated with insulin for some time, then worked on lifestyle  - weaned of insulin and decreased metformin in 07/2013 as hgba1c was 5.9  -denies: polyuria, polydipsia, wounds on feet  -not as good with exercise the few weeks - eating healthy -not checking home BS now  -has eye exams yearly with Dr. Kellie Moor office   HTN:  -on hxtz-lisinopril  -denies: CP, soc, HA, swelling   Tinnitus:  -seeing ENT with mild hearing loss - followed by [facility]   GERD:  -stable on PPI  -denies: heartburn, swallowing issues   Reported hx abnormal pap, told to repeat in 1 year  -I advised to to schedule repeat pap at her initial visit with me 10/2013  -she is going to do this next month - and will get mammogram there  ROS: See pertinent positives and negatives per HPI.   ROS: See pertinent positives and negatives per HPI.  Past Medical History  Diagnosis Date  . Headache(784.0)     sinus  . Arthritis     hand  . GERD (gastroesophageal reflux disease)     no current med.  . 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 Foreign body of hand, right 07/2011    right MP  . Hypertension     denies HTN, but takes Hyzaar   . Frequent headaches     Past Surgical History  Procedure Laterality Date  . No past surgeries    . Dorsal compartment release  08/26/2011    Procedure: RELEASE DORSAL COMPARTMENT (DEQUERVAIN);  Surgeon: Cammie Sickle., MD;  Location: [facility];  Service: Orthopedics;  Laterality: Right;  . Foreign body removal  08/26/2011    Procedure: FOREIGN BODY REMOVAL ADULT;  Surgeon:  Cammie Sickle., MD;  Location: [place];  Service: Orthopedics;  Laterality: Right;    Family History  Problem Relation Age of Onset  . Hyperlipidemia Mother   . Hypertension Mother   . Hypertension Father     History   Social History  . Marital Status: Single    Spouse Name: N/A    Number of Children: N/A  . Years of Education: N/A   Social History Main Topics  . Smoking status: Never Smoker   . Smokeless tobacco: Never Used  . Alcohol Use: Yes     Comment: occasionally  . Drug Use: No  . Sexual Activity: None   Other Topics Concern  . None   Social History Narrative   Work or School: works with senior center      Home Situation: lives alone      Spiritual Beliefs: none, Christian      Lifestyle: active at work but not otherwise; avoiding carbs             Current outpatient prescriptions:acetaminophen (TYLENOL) 500 MG tablet, Take 500 mg by mouth every 6 (six) hours as needed., Disp: , Rfl: ;  Aloe-Sodium Chloride (AYR SALINE NASAL GEL NA), Place into the nose., Disp: , Rfl: ;  FOLIC ACID PO, Take 053 mg by mouth daily., Disp: ,  Rfl: ;  losartan-hydrochlorothiazide (HYZAAR) 50-12.5 MG per tablet, Take 1 tablet by mouth daily. AM, Disp: , Rfl:  metFORMIN (GLUCOPHAGE) 1000 MG tablet, TAKE 1 TABLET BY MOUTH EVERY MORNING AND IN THE EVENING, Disp: 60 tablet, Rfl: 0;  Norgestimate-Ethinyl Estradiol Triphasic (ORTHO TRI-CYCLEN, 28,) 0.18/0.215/0.25 MG-35 MCG tablet, Take 1 tablet by mouth daily., Disp: 1 Package, Rfl: 11;  omeprazole (PRILOSEC) 20 MG capsule, Take 20 mg by mouth daily. , Disp: , Rfl:   EXAM:  Filed Vitals:   03/15/14 0908  BP: 136/88  Temp: 98.2 F (36.8 C)    Body mass index is 32.05 kg/(m^2).  GENERAL: vitals reviewed and listed above, alert, oriented, appears well hydrated and in no acute distress  HEENT: atraumatic, conjunttiva clear, no obvious abnormalities on inspection of external nose and ears  NECK: no obvious  masses on inspection  LUNGS: clear to auscultation bilaterally, no wheezes, rales or rhonchi, good air movement  CV: HRRR, no peripheral edema  MS: moves all extremities without noticeable abnormality  PSYCH: pleasant and cooperative, no obvious depression or anxiety  ASSESSMENT AND PLAN:  Discussed the following assessment and plan:  Essential hypertension  Gastroesophageal reflux disease, esophagitis presence not specified  Other abnormal cytological finding of specimen from cervix  Type 2 diabetes mellitus without complication  Chronic nonintractable headache, unspecified headache type  -Patient advised to return or notify a doctor immediately if symptoms worsen or persist or new concerns arise.  Patient Instructions  Before you leave: flu shot  Take 1000 IU of Vit D3 daily; 1200mg  daily - but this likely comes from your diet  We recommend the following healthy lifestyle measures: - eat a healthy diet consisting of lots of vegetables, fruits, beans, nuts, seeds, healthy meats such as white chicken and fish and whole grains.  - avoid fried foods, fast food, processed foods, sodas, red meet and other fattening foods.  - get a least 150 minutes of aerobic exercise per week.   Follow up in 3 months          KIM, [facility] R.

## 2014-03-15 NOTE — Patient Instructions (Signed)
Before you leave: flu shot  Take 1000 IU of Vit D3 daily; 1200mg  daily - but this likely comes from your diet  We recommend the following healthy lifestyle measures: - eat a healthy diet consisting of lots of vegetables, fruits, beans, nuts, seeds, healthy meats such as white chicken and fish and whole grains.  - avoid fried foods, fast food, processed foods, sodas, red meet and other fattening foods.  - get a least 150 minutes of aerobic exercise per week.   Follow up in 3 months

## 2014-03-15 NOTE — Progress Notes (Signed)
Pre visit review using our clinic review tool, if applicable. No additional management support is needed unless otherwise documented below in the visit note. 

## 2014-03-27 ENCOUNTER — Encounter: Payer: Self-pay | Admitting: Gynecology

## 2014-03-27 ENCOUNTER — Telehealth: Payer: Self-pay | Admitting: Gynecology

## 2014-03-27 NOTE — Telephone Encounter (Signed)
Upcoming appointment need to be rescheduled. Unable to leave a message, mailed letter.

## 2014-04-01 ENCOUNTER — Encounter: Payer: Self-pay | Admitting: Gynecology

## 2014-04-03 ENCOUNTER — Other Ambulatory Visit: Payer: Self-pay | Admitting: Family Medicine

## 2014-05-20 ENCOUNTER — Encounter: Payer: Self-pay | Admitting: Nurse Practitioner

## 2014-05-21 ENCOUNTER — Encounter: Payer: Self-pay | Admitting: Nurse Practitioner

## 2014-05-21 ENCOUNTER — Ambulatory Visit (INDEPENDENT_AMBULATORY_CARE_PROVIDER_SITE_OTHER): Payer: 59 | Admitting: Nurse Practitioner

## 2014-05-21 VITALS — BP 120/60 | HR 78 | Resp 14 | Ht 65.5 in | Wt 206.0 lb

## 2014-05-21 DIAGNOSIS — Z113 Encounter for screening for infections with a predominantly sexual mode of transmission: Secondary | ICD-10-CM

## 2014-05-21 DIAGNOSIS — Z0000 Encounter for general adult medical examination without abnormal findings: Secondary | ICD-10-CM

## 2014-05-21 DIAGNOSIS — Z01419 Encounter for gynecological examination (general) (routine) without abnormal findings: Secondary | ICD-10-CM

## 2014-05-21 MED ORDER — NORETHINDRONE 0.35 MG PO TABS: 1.0000 | ORAL_TABLET | Freq: Every day | ORAL | Status: DC

## 2014-05-21 MED ORDER — NORGESTIM-ETH ESTRAD TRIPHASIC 0.18/0.215/0.25 MG-35 MCG PO TABS: 1.0000 | ORAL_TABLET | Freq: Every day | ORAL | Status: DC

## 2014-05-21 NOTE — Progress Notes (Signed)
38 y.o. G0 Single African American Fe here for annual exam.  Menses is regular, length 1- 4 days on OCP.  Usually moderate to light.  Some cramps, PMS.  Not dating or SA for a year.  No recent STD's.  Uses condoms for birth control.  Works at [employer].  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  No follow was done.   she is taking folic acid.   Also states no history of HTN (but on med's).  When questioned again she says yes to history of HTN.  She also complains about a vaginal odor that is 'present a lot - really bad odor with menses'.  Patient's last menstrual period was 04/15/2014.          Sexually active: No.  The current method of family planning is OCP (estrogen/progesterone).    Exercising: Yes.    ellipitcal, treadmill, walking, 2 miles around the neigborhood 3x/wk Smoker:  no  Health Maintenance: Pap:  06/01/12 Negative HR HPV +  MMG:  06/14/12 Bi-Rads 1: Negative  TDaP: 03/19/13  Labs: Colin Benton, MD    reports that she has never smoked. She has never used smokeless tobacco. She reports that she drinks alcohol. She reports that she does not use illicit drugs.  Past Medical History  Diagnosis Date  . Headache(784.0)     sinus  . Arthritis     hand  . GERD (gastroesophageal reflux disease)     no current med.  . 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 Foreign body of hand, right 07/2011    right MP  . Hypertension     denies HTN, but takes Hyzaar   . Diabetes mellitus without complication     Past Surgical History  Procedure Laterality Date  . Dorsal compartment release  08/26/2011    Procedure: RELEASE DORSAL COMPARTMENT (DEQUERVAIN);  Surgeon: Cammie Sickle., MD;  Location: [facility];  Service: Orthopedics;  Laterality: Right;  . Foreign body removal  08/26/2011    Procedure: FOREIGN BODY REMOVAL ADULT;  Surgeon: Cammie Sickle., MD;  Location: [place];  Service: Orthopedics;  Laterality: Right;     Current Outpatient Prescriptions  Medication Sig Dispense Refill  . acetaminophen (TYLENOL) 500 MG tablet Take 500 mg by mouth every 6 (six) hours as needed.    . Aloe-Sodium Chloride (AYR SALINE NASAL GEL NA) Place into the nose.    Marland Kitchen FOLIC ACID PO Take 606 mg by mouth daily.    Marland Kitchen losartan-hydrochlorothiazide (HYZAAR) 50-12.5 MG per tablet Take 1 tablet by mouth daily. AM    . metFORMIN (GLUCOPHAGE) 1000 MG tablet TAKE 1 TABLET BY MOUTH EVERY MORNING AND IN THE EVENING 60 tablet 0  . omeprazole (PRILOSEC) 20 MG capsule Take 20 mg by mouth daily.     . norethindrone (MICRONOR,CAMILA,ERRIN) 0.35 MG tablet Take 1 tablet (0.35 mg total) by mouth daily. 3 Package 3   No current facility-administered medications for this visit.    Family History  Problem Relation Age of Onset  . Hyperlipidemia Mother   . Hypertension Mother   . Hypertension Father   . Stroke Father   . Breast cancer Paternal Grandmother     Age 47's  . Diabetes Mellitus II Paternal Grandmother   . Ovarian cancer Paternal Aunt     Age 19/58   . Ovarian cancer Other     Great Maternal Aunt   .  Stroke Paternal Grandfather   . Hypertension Sister   . Heart failure Maternal Grandfather     ROS:  Pertinent items are noted in HPI.  Otherwise, a comprehensive ROS was negative.  Exam:   BP 120/60 mmHg  Pulse 78  Resp 14  Ht 5' 5.5" (1.664 m)  Wt 206 lb (93.441 kg)  BMI 33.75 kg/m2  LMP 04/15/2014 Height: 5' 5.5" (166.4 cm)  Ht Readings from Last 3 Encounters:  05/21/14 5' 5.5" (1.664 m)  02/01/14 5' 6.75" (1.695 m)  01/03/14 5' 6.75" (1.695 m)    General appearance: alert, cooperative and appears stated age Head: Normocephalic, without obvious abnormality, atraumatic Neck: no adenopathy, supple, symmetrical, trachea midline and thyroid normal to inspection and palpation Lungs: clear to auscultation bilaterally Breasts: normal appearance, no masses or tenderness Heart: regular rate and rhythm Abdomen:  soft, non-tender; no masses,  no organomegaly Extremities: extremities normal, atraumatic, no cyanosis or edema Skin: Skin color, texture, turgor normal. No rashes or lesions Lymph nodes: Cervical, supraclavicular, and axillary nodes normal. No abnormal inguinal nodes palpated Neurologic: Grossly normal   Pelvic: External genitalia:  no lesions              Urethra:  normal appearing urethra with no masses, tenderness or lesions              Bartholin's and Skene's: normal                 Vagina: normal appearing vagina with normal color and discharge, no lesions              Cervix: anteverted              Pap taken: Yes.   Bimanual Exam:  Uterus:  normal size, contour, position, consistency, mobility, non-tender              Adnexa: no mass, fullness, tenderness               Rectovaginal: Confirms               Anus:  normal sphincter tone, no lesions  A:  Well Woman with normal exam  History of regular menses on OCP  History of HTN and DM  Will need to change OCP to POP  R/O STD's  P:   Reviewed health and wellness pertinent to exam  Pap smear taken today with HR HPV - most likely may need Colpo biopsy  Mammogram is due in March at birthday.  Will start on POP after this pack of OCP to end this weekend  Consult visit in 3 months to check on menses  Follow with STD test  Counseled on breast self exam, mammography screening, STD prevention, use and side effects of POP's, adequate intake of calcium and vitamin D, diet and exercise return annually or prn  An After Visit Summary was printed and given to the patient.

## 2014-05-22 LAB — STD PANEL
HEP B S AG: NEGATIVE
HIV 1&2 Ab, 4th Generation: NONREACTIVE

## 2014-05-22 LAB — WET PREP BY MOLECULAR PROBE
CANDIDA SPECIES: NEGATIVE
GARDNERELLA VAGINALIS: NEGATIVE
TRICHOMONAS VAG: NEGATIVE

## 2014-05-23 LAB — IPS N GONORRHOEA AND CHLAMYDIA BY PCR

## 2014-05-26 NOTE — Progress Notes (Signed)
Encounter reviewed by Dr. Cora Stetson Silva.  

## 2014-05-27 LAB — IPS PAP TEST WITH HPV

## 2014-05-29 ENCOUNTER — Telehealth: Payer: Self-pay | Admitting: *Deleted

## 2014-05-29 MED ORDER — NORETHINDRONE 0.35 MG PO TABS
1.0000 | ORAL_TABLET | Freq: Every day | ORAL | Status: DC
Start: 2014-05-29 — End: 2014-09-20

## 2014-05-29 NOTE — Telephone Encounter (Signed)
Called pt to discuss lab results.  These are given to patient.  Pt states that she went to pharmacy to pick up new OCP and RX was going to cost $100+ for three month supply.  Pharmacy would not give just one month supply.  Pt was unable to afford Norethindrone at that time.  Pt needed to start new pack on Friday.  Pt began taking new pack of previous prescription.  Advised pt I would send new RX to dispense only one pack at a time.  3 month follow up was backed up until April.  Please advise if any other recommendations.

## 2014-05-29 NOTE — Telephone Encounter (Signed)
agree

## 2014-06-10 ENCOUNTER — Telehealth: Payer: Self-pay | Admitting: Family Medicine

## 2014-06-10 MED ORDER — LOSARTAN POTASSIUM-HCTZ 50-12.5 MG PO TABS: 1.0000 | ORAL_TABLET | Freq: Every day | ORAL | Status: DC

## 2014-06-10 NOTE — Telephone Encounter (Signed)
sent 

## 2014-06-10 NOTE — Telephone Encounter (Signed)
Pt needs refill on losartan hctz 50-12.5 #30 w/refills sent to [address]

## 2014-06-14 ENCOUNTER — Ambulatory Visit (INDEPENDENT_AMBULATORY_CARE_PROVIDER_SITE_OTHER): Payer: 59 | Admitting: Family Medicine

## 2014-06-14 ENCOUNTER — Encounter: Payer: Self-pay | Admitting: Family Medicine

## 2014-06-14 VITALS — BP 116/80 | HR 85 | Temp 98.2°F | Ht 65.5 in | Wt 206.4 lb

## 2014-06-14 DIAGNOSIS — E789 Disorder of lipoprotein metabolism, unspecified: Secondary | ICD-10-CM

## 2014-06-14 DIAGNOSIS — K219 Gastro-esophageal reflux disease without esophagitis: Secondary | ICD-10-CM

## 2014-06-14 DIAGNOSIS — E119 Type 2 diabetes mellitus without complications: Secondary | ICD-10-CM

## 2014-06-14 DIAGNOSIS — I10 Essential (primary) hypertension: Secondary | ICD-10-CM

## 2014-06-14 LAB — HEMOGLOBIN A1C: HEMOGLOBIN A1C: 6.3 % (ref 4.6–6.5)

## 2014-06-14 LAB — BASIC METABOLIC PANEL
BUN: 14 mg/dL (ref 6–23)
CALCIUM: 9.3 mg/dL (ref 8.4–10.5)
CHLORIDE: 102 meq/L (ref 96–112)
CO2: 27 mEq/L (ref 19–32)
CREATININE: 0.84 mg/dL (ref 0.40–1.20)
GFR: 96.65 mL/min (ref >60.00)
Glucose, Bld: 95 mg/dL (ref 70–99)
POTASSIUM: 4.1 meq/L (ref 3.5–5.1)
SODIUM: 136 meq/L (ref 135–145)

## 2014-06-14 LAB — LIPID PANEL
CHOL/HDL RATIO: 2
Cholesterol: 148 mg/dL (ref 0–200)
HDL: 77.1 mg/dL (ref >39.00)
LDL Cholesterol: 57 mg/dL (ref 0–99)
NONHDL: 70.9
Triglycerides: 70 mg/dL (ref 0.0–149.0)
VLDL: 14 mg/dL (ref 0.0–40.0)

## 2014-06-14 MED ORDER — OMEPRAZOLE 20 MG PO CPDR: 20.0000 mg | DELAYED_RELEASE_CAPSULE | Freq: Every day | ORAL | Status: DC

## 2014-06-14 NOTE — Patient Instructions (Signed)
BEFORE YOU LEAVE: Labs Follow up in 3 months  Schedule your mammogram  Topical yeast cream for rash  -We have ordered labs or studies at this visit. It can take up to 1-2 weeks for results and processing. We will contact you with instructions IF your results are abnormal. Normal results will be released to your [facility]. If you have not heard from [place] or can not find your results in [facility] in 2 weeks please contact our office.  -PLEASE SIGN UP FOR MYCHART TODAY   We recommend the following healthy lifestyle measures: - eat a healthy diet consisting of lots of vegetables, fruits, beans, nuts, seeds, healthy meats such as white chicken and fish and whole grains.  - avoid fried foods, fast food, processed foods, sodas, red meet and other fattening foods.  - get a least 150 minutes of aerobic exercise per week.

## 2014-06-14 NOTE — Addendum Note (Signed)
Addended by: Lucretia Kern on: 06/14/2014 10:41 AM   Modules accepted: Orders

## 2014-06-14 NOTE — Addendum Note (Signed)
Addended by: Lucretia Kern on: 06/14/2014 10:44 AM   Modules accepted: Medications

## 2014-06-14 NOTE — Progress Notes (Addendum)
HPI:  DM:  -worsened in end of 2014, treated with insulin for some time, then worked on lifestyle  - weaned off insulin and decreased metformin in 07/2013 as hgba1c was 5.9 - taking metformin once daily -denies: polyuria, polydipsia, wounds on feet  -walking daily - eating healthy  -not checking home BS now  -has eye exams yearly with Dr. Kellie Moor office   HTN:  -on hxtz-lisinopril  -denies: CP, soc, HA, swelling   Tinnitus:  -seeing ENT with mild hearing loss - followed by [facility]   GERD:  -stable on PPI  -denies: heartburn, swallowing issues   Rash: -inframammary crease L -started recently     ROS: See pertinent positives and negatives per HPI.  Past Medical History  Diagnosis Date  . Headache(784.0)     sinus  . Arthritis     hand  . GERD (gastroesophageal reflux disease)     no current med.  . 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 Foreign body of hand, right 07/2011    right MP  . Hypertension     denies HTN, but takes Hyzaar   . Diabetes mellitus without complication     Past Surgical History  Procedure Laterality Date  . Dorsal compartment release  08/26/2011    Procedure: RELEASE DORSAL COMPARTMENT (DEQUERVAIN);  Surgeon: Cammie Sickle., MD;  Location: [facility];  Service: Orthopedics;  Laterality: Right;  . Foreign body removal  08/26/2011    Procedure: FOREIGN BODY REMOVAL ADULT;  Surgeon: Cammie Sickle., MD;  Location: [place];  Service: Orthopedics;  Laterality: Right;    Family History  Problem Relation Age of Onset  . Hyperlipidemia Mother   . Hypertension Mother   . Hypertension Father   . Stroke Father   . Breast cancer Paternal Grandmother     Age 48's  . Diabetes Mellitus II Paternal Grandmother   . Ovarian cancer Paternal Aunt     Age 54/58   . Ovarian cancer Other     Great Maternal Aunt   . Stroke Paternal Grandfather   . Hypertension Sister   . Heart failure  Maternal Grandfather     History   Social History  . Marital Status: Single    Spouse Name: N/A    Number of Children: N/A  . Years of Education: N/A   Social History Main Topics  . Smoking status: Never Smoker   . Smokeless tobacco: Never Used  . Alcohol Use: 0.0 oz/week    0 Not specified per week     Comment: occasionally  . Drug Use: No  . Sexual Activity: None   Other Topics Concern  . None   Social History Narrative   Work or School: works with senior center      Home Situation: lives alone      Spiritual Beliefs: none, Christian      Lifestyle: active at work but not otherwise; avoiding carbs              Current outpatient prescriptions:  .  Aloe-Sodium Chloride (AYR SALINE NASAL GEL NA), Place into the nose., Disp: , Rfl:  .  aspirin 325 MG tablet, Take 325 mg by mouth daily as needed., Disp: , Rfl:  .  Cholecalciferol (VITAMIN D3) 400 UNITS CAPS, Take by mouth daily., Disp: , Rfl:  .  FOLIC ACID PO, Take 308 mg by mouth daily., Disp: , Rfl:  .  losartan-hydrochlorothiazide (HYZAAR)  50-12.5 MG per tablet, Take 1 tablet by mouth daily. AM, Disp: 90 tablet, Rfl: 3 .  metFORMIN (GLUCOPHAGE) 1000 MG tablet, TAKE 1 TABLET BY MOUTH EVERY MORNING AND IN THE EVENING, Disp: 60 tablet, Rfl: 0 .  norethindrone (MICRONOR,CAMILA,ERRIN) 0.35 MG tablet, Take 1 tablet (0.35 mg total) by mouth daily., Disp: 1 Package, Rfl: 12 .  omeprazole (PRILOSEC) 20 MG capsule, Take 1 capsule (20 mg total) by mouth daily., Disp: 90 capsule, Rfl: 1  EXAM:  Filed Vitals:   06/14/14 1020  BP: 116/80  Pulse: 85  Temp: 98.2 F (36.8 C)    Body mass index is 33.81 kg/(m^2).  GENERAL: vitals reviewed and listed above, alert, oriented, appears well hydrated and in no acute distress  HEENT: atraumatic, conjunttiva clear, no obvious abnormalities on inspection of external nose and ears  NECK: no obvious masses on inspection  LUNGS: clear to auscultation bilaterally, no wheezes,  rales or rhonchi, good air movement  CV: HRRR, no peripheral edema  SKIN: mild area of erythema L imframamary gland  MS: moves all extremities without noticeable abnormality  PSYCH: pleasant and cooperative, no obvious depression or anxiety  ASSESSMENT AND PLAN:  Discussed the following assessment and plan:  Essential hypertension - Plan: Basic metabolic panel  Lipid disorder - Plan: Lipid panel  Gastroesophageal reflux disease, esophagitis presence not specified  Type 2 diabetes mellitus without complication - Plan: Hemoglobin M2N, Basic metabolic panel  -doing well -lifestyle recs -FASTING labs today -follow up in 3 months -topical yeast cream for mild intertrigo, follow up prn -Patient advised to return or notify a doctor immediately if symptoms worsen or persist or new concerns arise.  Patient Instructions  BEFORE YOU LEAVE: Labs Follow up in 3 months  Schedule your mammogram  Topical yeast cream for rash  -We have ordered labs or studies at this visit. It can take up to 1-2 weeks for results and processing. We will contact you with instructions IF your results are abnormal. Normal results will be released to your [facility]. If you have not heard from [place] or can not find your results in [facility] in 2 weeks please contact our office.  -PLEASE SIGN UP FOR MYCHART TODAY   We recommend the following healthy lifestyle measures: - eat a healthy diet consisting of lots of vegetables, fruits, beans, nuts, seeds, healthy meats such as white chicken and fish and whole grains.  - avoid fried foods, fast food, processed foods, sodas, red meet and other fattening foods.  - get a least 150 minutes of aerobic exercise per week.        Colin Benton R.

## 2014-06-14 NOTE — Progress Notes (Signed)
Pre visit review using our clinic review tool, if applicable. No additional management support is needed unless otherwise documented below in the visit note. 

## 2014-06-29 ENCOUNTER — Other Ambulatory Visit: Payer: Self-pay | Admitting: Family Medicine

## 2014-07-22 ENCOUNTER — Other Ambulatory Visit: Payer: Self-pay

## 2014-07-22 DIAGNOSIS — Z1231 Encounter for screening mammogram for malignant neoplasm of breast: Secondary | ICD-10-CM

## 2014-07-26 ENCOUNTER — Ambulatory Visit
Admission: RE | Admit: 2014-07-26 | Discharge: 2014-07-26 | Disposition: A | Discharge status: Home / Self Care | Payer: 59 | Source: Ambulatory Visit

## 2014-07-26 DIAGNOSIS — Z1231 Encounter for screening mammogram for malignant neoplasm of breast: Secondary | ICD-10-CM

## 2014-08-20 ENCOUNTER — Ambulatory Visit: Payer: 59 | Admitting: Nurse Practitioner

## 2014-08-20 ENCOUNTER — Telehealth: Payer: Self-pay | Admitting: Nurse Practitioner

## 2014-08-20 NOTE — Telephone Encounter (Signed)
Spoke with patient. Advised patient results from screening mammogram performed on 07/26/2014 are available to provider in chart through EPIC. Patient is agreeable and verbalizes understanding. Advised per those results patient will need follow up screening in one year. Patient is agreeable.   Routing to provider for final review. Patient agreeable to disposition. Will close encounter

## 2014-08-20 NOTE — Telephone Encounter (Signed)
Patient wanting to know if [facility].

## 2014-08-20 NOTE — Telephone Encounter (Signed)
Left message to call [place] at [phone].  Results from 07/26/2014 [facility] are available in [facility].

## 2014-09-13 ENCOUNTER — Encounter: Payer: 59 | Admitting: Family Medicine

## 2014-09-13 ENCOUNTER — Ambulatory Visit (INDEPENDENT_AMBULATORY_CARE_PROVIDER_SITE_OTHER): Payer: 59 | Admitting: Family Medicine

## 2014-09-13 ENCOUNTER — Encounter: Payer: Self-pay | Admitting: Nurse Practitioner

## 2014-09-13 ENCOUNTER — Encounter: Payer: Self-pay | Admitting: Family Medicine

## 2014-09-13 ENCOUNTER — Telehealth: Payer: Self-pay | Admitting: Nurse Practitioner

## 2014-09-13 VITALS — BP 132/82 | HR 100 | Temp 98.1°F | Ht 65.5 in | Wt 208.3 lb

## 2014-09-13 DIAGNOSIS — E119 Type 2 diabetes mellitus without complications: Secondary | ICD-10-CM | POA: Insufficient documentation

## 2014-09-13 DIAGNOSIS — Z6833 Body mass index (BMI) 33.0-33.9, adult: Secondary | ICD-10-CM

## 2014-09-13 DIAGNOSIS — E1169 Type 2 diabetes mellitus with other specified complication: Secondary | ICD-10-CM | POA: Insufficient documentation

## 2014-09-13 DIAGNOSIS — I10 Essential (primary) hypertension: Secondary | ICD-10-CM

## 2014-09-13 DIAGNOSIS — K219 Gastro-esophageal reflux disease without esophagitis: Secondary | ICD-10-CM

## 2014-09-13 LAB — HEMOGLOBIN A1C: HEMOGLOBIN A1C: 6 % (ref 4.6–6.5)

## 2014-09-13 LAB — BASIC METABOLIC PANEL
BUN: 16 mg/dL (ref 6–23)
CALCIUM: 9.2 mg/dL (ref 8.4–10.5)
CO2: 27 meq/L (ref 19–32)
Chloride: 103 mEq/L (ref 96–112)
Creatinine, Ser: 0.85 mg/dL (ref 0.40–1.20)
GFR: 95.22 mL/min (ref >60.00)
GLUCOSE: 85 mg/dL (ref 70–99)
Potassium: 3.7 mEq/L (ref 3.5–5.1)
Sodium: 136 mEq/L (ref 135–145)

## 2014-09-13 MED ORDER — METFORMIN HCL 1000 MG PO TABS: 1000.0000 mg | ORAL_TABLET | Freq: Every day | ORAL | Status: DC

## 2014-09-13 NOTE — Progress Notes (Signed)
error    This encounter was created in error - please disregard.

## 2014-09-13 NOTE — Progress Notes (Signed)
Pre visit review using our clinic review tool, if applicable. No additional management support is needed unless otherwise documented below in the visit note. 

## 2014-09-13 NOTE — Telephone Encounter (Signed)
I was unable to reach patient by phone, mailed letter, upcoming appointment has been canceled and needs to be rescheduled.

## 2014-09-13 NOTE — Progress Notes (Signed)
HPI:  DM/Obesity:  -worsened in end of 2014, treated with insulin for some time, then worked on lifestyle  -meds: metformin once daily - about 3-4 days per week - she thinks it makes her feel tired -denies: polyuria, polydipsia, wounds on feet  -walking daily, doing yoga, diet ok sometimes slips up -not checking home BS now  -has eye exams yearly with Dr. Kellie Moor office  -stable  HTN:  -on hxtz-losartan -denies: CP, soc, HA, swelling  -stable  Tinnitus:  -seeing ENT with mild hearing loss - followed by [facility]  -stable  GERD:  -stable on PPI  -she heard about memory issues with prilosec and stopped -has mild acid symptoms a few times per week if eats the wrong things -denies: heartburn, swallowing issues    ROS: See pertinent positives and negatives per HPI.  Past Medical History  Diagnosis Date  . Headache(784.0)     sinus  . Arthritis     hand  . GERD (gastroesophageal reflux disease)     no current med.  . 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 Foreign body of hand, right 07/2011    right MP  . Hypertension     denies HTN, but takes Hyzaar   . Diabetes mellitus without complication   . Abnormal Pap smear of cervix - hpv per report in 2014 with instructions to repeat in 1 year by Dr. Karlton Lemon 11/16/2013    normal pap with gyn 2015    Past Surgical History  Procedure Laterality Date  . Dorsal compartment release  08/26/2011    Procedure: RELEASE DORSAL COMPARTMENT (DEQUERVAIN);  Surgeon: Cammie Sickle., MD;  Location: [facility];  Service: Orthopedics;  Laterality: Right;  . Foreign body removal  08/26/2011    Procedure: FOREIGN BODY REMOVAL ADULT;  Surgeon: Cammie Sickle., MD;  Location: [place];  Service: Orthopedics;  Laterality: Right;    Family History  Problem Relation Age of Onset  . Hyperlipidemia Mother   . Hypertension Mother   . Hypertension Father   . Stroke Father   . Breast  cancer Paternal Grandmother     Age 15's  . Diabetes Mellitus II Paternal Grandmother   . Ovarian cancer Paternal Aunt     Age 18/58   . Ovarian cancer Other     Great Maternal Aunt   . Stroke Paternal Grandfather   . Hypertension Sister   . Heart failure Maternal Grandfather     History   Social History  . Marital Status: Single    Spouse Name: N/A  . Number of Children: N/A  . Years of Education: N/A   Social History Main Topics  . Smoking status: Never Smoker   . Smokeless tobacco: Never Used  . Alcohol Use: 0.0 oz/week    0 Standard drinks or equivalent per week     Comment: occasionally  . Drug Use: No  . Sexual Activity: Not on file   Other Topics Concern  . None   Social History Narrative   Work or School: works with senior center      Home Situation: lives alone      Spiritual Beliefs: none, Christian      Lifestyle: active at work but not otherwise; avoiding carbs              Current outpatient prescriptions:  .  acetaminophen (TYLENOL) 650 MG CR tablet, Take 650 mg by mouth every 8 (eight)  hours as needed for pain., Disp: , Rfl:  .  Aloe-Sodium Chloride (AYR SALINE NASAL GEL NA), Place into the nose., Disp: , Rfl:  .  aspirin 325 MG tablet, Take 325 mg by mouth daily as needed., Disp: , Rfl:  .  Cholecalciferol (VITAMIN D3) 400 UNITS CAPS, Take by mouth daily., Disp: , Rfl:  .  FOLIC ACID PO, Take 425 mg by mouth daily., Disp: , Rfl:  .  losartan-hydrochlorothiazide (HYZAAR) 50-12.5 MG per tablet, Take 1 tablet by mouth daily. AM, Disp: 90 tablet, Rfl: 3 .  metFORMIN (GLUCOPHAGE) 1000 MG tablet, Take 1 tablet (1,000 mg total) by mouth daily with breakfast., Disp: 90 tablet, Rfl: 3 .  norethindrone (MICRONOR,CAMILA,ERRIN) 0.35 MG tablet, Take 1 tablet (0.35 mg total) by mouth daily., Disp: 1 Package, Rfl: 12  EXAM:  Filed Vitals:   09/13/14 1350  BP: 132/82  Pulse: 100  Temp: 98.1 F (36.7 C)    Body mass index is 34.12 kg/(m^2).  GENERAL:  vitals reviewed and listed above, alert, oriented, appears well hydrated and in no acute distress  HEENT: atraumatic, conjunttiva clear, no obvious abnormalities on inspection of external nose and ears  NECK: no obvious masses on inspection  LUNGS: clear to auscultation bilaterally, no wheezes, rales or rhonchi, good air movement  CV: HRRR, no peripheral edema  MS: moves all extremities without noticeable abnormality  PSYCH: pleasant and cooperative, no obvious depression or anxiety  Foot exam done  ASSESSMENT AND PLAN:  Discussed the following assessment and plan:  Body mass index (BMI) of 33.0-33.9 in adult  Type 2 diabetes mellitus without complication - Plan: Hemoglobin A1c  Essential hypertension - Plan: Basic metabolic panel  Gastroesophageal reflux disease, esophagitis presence not specified  -may try lower dose metformin in the morning and the 1000mg  at night if needed given her issue with the metformin -if labs ok continue every other day metformin or every other day ext release once daily in evening? -NON-fasting labs -discussed other options for GERD -lifestyle recs - congratulated walking and yoga, encouraged less sweets and carbs -Patient advised to return or notify a doctor immediately if symptoms worsen or persist or new concerns arise.  Patient Instructions  BEFORE YOU LEAVE: -labs -follow up appointment in 3-4 months   Can try znatac for your acid if you wish or avoid foods that cause harm   We recommend the following healthy lifestyle measures: - eat a healthy diet consisting of lots of vegetables, fruits, beans, nuts, seeds, healthy meats such as white chicken and fish  - avoid fried foods, fast food, processed foods, sodas, red meet and other fattening foods.  - get a least 150 minutes of aerobic exercise per week.       Colin Benton R.

## 2014-09-13 NOTE — Patient Instructions (Addendum)
BEFORE YOU LEAVE: -labs -follow up appointment in 3-4 months   Can try znatac for your acid if you wish or avoid foods that cause harm   We recommend the following healthy lifestyle measures: - eat a healthy diet consisting of lots of vegetables, fruits, beans, nuts, seeds, healthy meats such as white chicken and fish  - avoid fried foods, fast food, processed foods, sodas, red meet and other fattening foods.  - get a least 150 minutes of aerobic exercise per week.

## 2014-09-20 ENCOUNTER — Ambulatory Visit (INDEPENDENT_AMBULATORY_CARE_PROVIDER_SITE_OTHER): Payer: 59 | Admitting: Nurse Practitioner

## 2014-09-20 ENCOUNTER — Encounter: Payer: Self-pay | Admitting: Nurse Practitioner

## 2014-09-20 VITALS — BP 118/74 | HR 76 | Ht 65.5 in | Wt 208.0 lb

## 2014-09-20 DIAGNOSIS — Z308 Encounter for other contraceptive management: Secondary | ICD-10-CM

## 2014-09-20 MED ORDER — NORETHINDRONE 0.35 MG PO TABS: 1.0000 | ORAL_TABLET | Freq: Every day | ORAL | Status: DC

## 2014-09-20 NOTE — Progress Notes (Signed)
Patient ID: Linda Conrad, female   DOB: 1975/09/29, 39 y.o.   MRN: [id number] S: 39 y.o. SAA Fe G0 was started on POP secondary to history of HTN.  She completed the OCP in January and had a cycle on her own in February.   She then started on Micronor about the end of February.  First month on POP no menses and no PMS.  The second month is current and no menses but should be due about this weekend.   She has no vaso symptoms and feels well.  Menses are normally 1-4 days and moderate to light flow.  She currently is not dating or SA.  She is aware that timing of taking POP must be consistent.  She also has no complaints about the 'bad odor' during her menses.  O:   Alert and cooperative  BP today  118/74    A:   History of HTN and on Hyzaar with control  Change from OCP to POP 05/21/14 at [place]:  Will continue with Micronor and discussed that amenorrhea was possible as long as she was taking med's.  But this was OK and nothing to worry about.  She is pleased with amenorrhea.  She will also report any AUB as well.  Rx for the remainder year is sent to the pharmacy.  Consult time 15 minutes face to face

## 2014-09-24 NOTE — Patient Instructions (Signed)
Call if abnormal bleeding

## 2014-09-26 NOTE — Progress Notes (Signed)
Encounter reviewed by Dr. Brook Silva.  

## 2014-11-15 ENCOUNTER — Other Ambulatory Visit: Payer: Self-pay | Admitting: Family Medicine

## 2014-12-16 ENCOUNTER — Ambulatory Visit: Payer: 59 | Admitting: Family Medicine

## 2014-12-23 ENCOUNTER — Ambulatory Visit (INDEPENDENT_AMBULATORY_CARE_PROVIDER_SITE_OTHER): Payer: 59 | Admitting: Family Medicine

## 2014-12-23 ENCOUNTER — Encounter: Payer: Self-pay | Admitting: Family Medicine

## 2014-12-23 VITALS — BP 120/82 | HR 86 | Temp 98.2°F | Ht 65.5 in | Wt 209.2 lb

## 2014-12-23 DIAGNOSIS — Z6833 Body mass index (BMI) 33.0-33.9, adult: Secondary | ICD-10-CM

## 2014-12-23 DIAGNOSIS — E119 Type 2 diabetes mellitus without complications: Secondary | ICD-10-CM

## 2014-12-23 DIAGNOSIS — I10 Essential (primary) hypertension: Secondary | ICD-10-CM

## 2014-12-23 DIAGNOSIS — K219 Gastro-esophageal reflux disease without esophagitis: Secondary | ICD-10-CM | POA: Diagnosis not present

## 2014-12-23 NOTE — Progress Notes (Signed)
HPI:   HPI:  DM/Obesity:  -worsened in end of 2014, treated with insulin for some time, then worked on lifestyle  -meds: metformin once daily - about 3-4 days per week - she thinks it makes her feel tired -denies: polyuria, polydipsia, wounds on feet  -walking daily, doing yoga, diet ok sometimes slips up -not checking home BS now  -has eye exams yearly with Dr. Kellie Moor office  -stable Lab Results  Component Value Date   HGBA1C 6.0 09/13/2014   HTN:  -on hxtz-losartan -denies: CP, soc, HA, swelling  -stable  GERD:  -stable on PPI  -she heard about memory issues with prilosec and stopped -has mild acid symptoms a few times per week if eats the wrong things -denies: heartburn, swallowing issues  ROS: See pertinent positives and negatives per HPI.  Past Medical History  Diagnosis Date  . Headache(784.0)     sinus  . Arthritis     hand  . GERD (gastroesophageal reflux disease)     no current med.  . 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 Foreign body of hand, right 07/2011    right MP  . Hypertension     denies HTN, but takes Hyzaar   . Diabetes mellitus without complication   . Abnormal Pap smear of cervix - hpv per report in 2014 with instructions to repeat in 1 year by Dr. Karlton Lemon 11/16/2013    normal pap with gyn 2015    Past Surgical History  Procedure Laterality Date  . Dorsal compartment release  08/26/2011    Procedure: RELEASE DORSAL COMPARTMENT (DEQUERVAIN);  Surgeon: Cammie Sickle., MD;  Location: [facility];  Service: Orthopedics;  Laterality: Right;  . Foreign body removal  08/26/2011    Procedure: FOREIGN BODY REMOVAL ADULT;  Surgeon: Cammie Sickle., MD;  Location: [facility];  Service: Orthopedics;  Laterality: Right;    Family History  Problem Relation Age of Onset  . Hyperlipidemia Mother   . Hypertension Mother   . Hypertension Father   . Stroke Father   . Breast cancer Paternal  Grandmother     Age 16's  . Diabetes Mellitus II Paternal Grandmother   . Ovarian cancer Paternal Aunt     Age 60/58   . Ovarian cancer Other     Great Maternal Aunt   . Stroke Paternal Grandfather   . Hypertension Sister   . Heart failure Maternal Grandfather     History   Social History  . Marital Status: Single    Spouse Name: N/A  . Number of Children: N/A  . Years of Education: N/A   Social History Main Topics  . Smoking status: Never Smoker   . Smokeless tobacco: Never Used  . Alcohol Use: 0.0 oz/week    0 Standard drinks or equivalent per week     Comment: occasionally  . Drug Use: No  . Sexual Activity: Not on file   Other Topics Concern  . None   Social History Narrative   Work or School: works with senior center      Home Situation: lives alone      Spiritual Beliefs: none, Christian      Lifestyle: active at work but not otherwise; avoiding carbs              Current outpatient prescriptions:  .  acetaminophen (TYLENOL) 650 MG CR tablet, Take 650 mg by mouth every 8 (eight) hours as needed  for pain., Disp: , Rfl:  .  Aloe-Sodium Chloride (AYR SALINE NASAL GEL NA), Place into the nose., Disp: , Rfl:  .  Cholecalciferol (VITAMIN D3) 400 UNITS CAPS, Take by mouth daily., Disp: , Rfl:  .  FOLIC ACID PO, Take 098 mg by mouth daily., Disp: , Rfl:  .  losartan-hydrochlorothiazide (HYZAAR) 50-12.5 MG per tablet, Take 1 tablet by mouth daily. AM, Disp: 90 tablet, Rfl: 3 .  metFORMIN (GLUCOPHAGE) 1000 MG tablet, Take 1 tablet (1,000 mg total) by mouth daily with breakfast., Disp: 90 tablet, Rfl: 3 .  norethindrone (MICRONOR,CAMILA,ERRIN) 0.35 MG tablet, Take 1 tablet (0.35 mg total) by mouth daily., Disp: 1 Package, Rfl: 9  EXAM:  Filed Vitals:   12/23/14 1305  BP: 120/82  Pulse: 86  Temp: 98.2 F (36.8 C)    Body mass index is 34.27 kg/(m^2).  GENERAL: vitals reviewed and listed above, alert, oriented, appears well hydrated and in no acute  distress  HEENT: atraumatic, conjunttiva clear, no obvious abnormalities on inspection of external nose and ears  NECK: no obvious masses on inspection  LUNGS: clear to auscultation bilaterally, no wheezes, rales or rhonchi, good air movement  CV: HRRR, no peripheral edema  MS: moves all extremities without noticeable abnormality  SKIN: scattered SK, few small dark moles on upper back  PSYCH: pleasant and cooperative, no obvious depression or anxiety  ASSESSMENT AND PLAN:  Discussed the following assessment and plan:  Type 2 diabetes mellitus without complication  Body mass index (BMI) of 33.0-33.9 in adult  Essential hypertension  Gastroesophageal reflux disease, esophagitis presence not specified  -doing well - diet not as good and advised improvement in diet -FASTING labs in 3-4 months at follow up physical in 3 month -Patient advised to return or notify a doctor immediately if symptoms worsen or persist or new concerns arise.  Patient Instructions  BEFORE YOU LEAVE: -schedule physical exam in 3-4 months - come fasting but drink plenty of water  We recommend the following healthy lifestyle measures: - eat a healthy diet consisting of lots of vegetables, fruits, beans, nuts, seeds, healthy meats such as white chicken and fish and whole grains.  - avoid fried foods, fast food, processed foods, sodas, red meet and other fattening foods.  - get a least 150 minutes of aerobic exercise per week.   See you dermatologist about the moles on your back     KIM, Nickola Major.

## 2014-12-23 NOTE — Patient Instructions (Signed)
BEFORE YOU LEAVE: -schedule physical exam in 3-4 months - come fasting but drink plenty of water  We recommend the following healthy lifestyle measures: - eat a healthy diet consisting of lots of vegetables, fruits, beans, nuts, seeds, healthy meats such as white chicken and fish and whole grains.  - avoid fried foods, fast food, processed foods, sodas, red meet and other fattening foods.  - get a least 150 minutes of aerobic exercise per week.   See you dermatologist about the moles on your back

## 2014-12-23 NOTE — Progress Notes (Signed)
Pre visit review using our clinic review tool, if applicable. No additional management support is needed unless otherwise documented below in the visit note. 

## 2015-01-21 ENCOUNTER — Other Ambulatory Visit: Payer: Self-pay | Admitting: Family Medicine

## 2015-04-03 ENCOUNTER — Ambulatory Visit (INDEPENDENT_AMBULATORY_CARE_PROVIDER_SITE_OTHER): Payer: 59 | Admitting: Family Medicine

## 2015-04-03 ENCOUNTER — Encounter: Payer: Self-pay | Admitting: Family Medicine

## 2015-04-03 ENCOUNTER — Ambulatory Visit (INDEPENDENT_AMBULATORY_CARE_PROVIDER_SITE_OTHER)
Admission: RE | Admit: 2015-04-03 | Discharge: 2015-04-03 | Disposition: A | Discharge status: Home / Self Care | Payer: 59 | Source: Ambulatory Visit | Attending: Family Medicine | Admitting: Family Medicine

## 2015-04-03 VITALS — BP 122/78 | HR 82 | Temp 98.1°F | Ht 65.75 in | Wt 207.7 lb

## 2015-04-03 DIAGNOSIS — M545 Low back pain: Secondary | ICD-10-CM | POA: Diagnosis not present

## 2015-04-03 DIAGNOSIS — E119 Type 2 diabetes mellitus without complications: Secondary | ICD-10-CM

## 2015-04-03 DIAGNOSIS — Z6833 Body mass index (BMI) 33.0-33.9, adult: Secondary | ICD-10-CM

## 2015-04-03 DIAGNOSIS — I10 Essential (primary) hypertension: Secondary | ICD-10-CM | POA: Diagnosis not present

## 2015-04-03 DIAGNOSIS — Z0000 Encounter for general adult medical examination without abnormal findings: Secondary | ICD-10-CM | POA: Diagnosis not present

## 2015-04-03 DIAGNOSIS — Z23 Encounter for immunization: Secondary | ICD-10-CM

## 2015-04-03 LAB — BASIC METABOLIC PANEL
BUN: 14 mg/dL (ref 6–23)
CALCIUM: 9.5 mg/dL (ref 8.4–10.5)
CHLORIDE: 104 meq/L (ref 96–112)
CO2: 27 meq/L (ref 19–32)
Creatinine, Ser: 0.86 mg/dL (ref 0.40–1.20)
GFR: 93.68 mL/min (ref >60.00)
GLUCOSE: 91 mg/dL (ref 70–99)
Potassium: 4.2 mEq/L (ref 3.5–5.1)
Sodium: 138 mEq/L (ref 135–145)

## 2015-04-03 LAB — LIPID PANEL
CHOLESTEROL: 151 mg/dL (ref 0–200)
HDL: 59.3 mg/dL (ref >39.00)
LDL CALC: 83 mg/dL (ref 0–99)
NonHDL: 91.8
TRIGLYCERIDES: 46 mg/dL (ref 0.0–149.0)
Total CHOL/HDL Ratio: 3
VLDL: 9.2 mg/dL (ref 0.0–40.0)

## 2015-04-03 LAB — HEMOGLOBIN A1C: Hgb A1c MFr Bld: 5.6 % (ref 4.6–6.5)

## 2015-04-03 MED ORDER — MOMETASONE FUROATE 0.1 % EX CREA: 1.0000 "application " | TOPICAL_CREAM | Freq: Every day | CUTANEOUS | Status: AC

## 2015-04-03 NOTE — Patient Instructions (Signed)
BEFORE YOU LEAVE: -low back exercises -xray sheet -flu shot -labs -follow up in 4 months or soone rif back pain persists per instructions below  For the back pain: -do the exercises at least 4 days per week -heat and tylenol or ibuprofen as needed per instructions -follow up in 4 weeks if persists  -We have ordered labs or studies at this visit. It can take up to 1-2 weeks for results and processing. We will contact you with instructions IF your results are abnormal. Normal results will be released to your [facility]. If you have not heard from [place] or can not find your results in [facility] in 2 weeks please contact our office.  We recommend the following healthy lifestyle measures: - eat a healthy whole foods diet consisting of regular small meals composed of vegetables, fruits, beans, nuts, seeds, healthy meats such as white chicken and fish and whole grains.  - avoid sweets, white starchy foods, fried foods, fast food, processed foods, sodas, red meet and other fattening foods.  - get a least 150-300 minutes of aerobic exercise per week.

## 2015-04-03 NOTE — Addendum Note (Signed)
Addended by: Agnes Lawrence on: 04/03/2015 09:10 AM   Modules accepted: Orders

## 2015-04-03 NOTE — Progress Notes (Signed)
HPI:  Here for CPE:  Reports she is doing well. Had lumbar back pain intermittently a few times per month and takes ibuprofen 3-4 times per month for this. Started several months ago. Denies persistent pain severe pain, malaise, radiation, weakness, numbness, bowel or bladder incontinence. She plans to see dermatologist ofr a skin check and sees gynecologist, Juleen Starr yearly for gyn and and breast health.  -Diet: variety of foods, balance and well rounded, larger portion sizes  -Exercise: no regular exercise  -Taking folic acid, vitamin D or calcium: no  -Diabetes and Dyslipidemia Screening: Doing labs today  -Hx of HTN: no  -Vaccines: wants flu vaccine today  -pap history: UTD, does gyn and breast health with gyn  -sexual activity: yes, female partner, no new partners  -wants STI testing (Hep C if born [birth year]): no  -Alcohol, Tobacco, drug use: see social history  Review of Systems - no fevers, unintentional weight loss, vision loss, hearing loss, chest pain, sob, hemoptysis, melena, hematochezia, hematuria, genital discharge, changing or concerning skin lesions, bleeding, bruising, loc, thoughts of self harm or SI  Past Medical History  Diagnosis Date  . Headache(784.0)     sinus  . Arthritis     hand  . GERD (gastroesophageal reflux disease)     no current med.  . 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 Foreign body of hand, right 07/2011    right MP  . Hypertension     denies HTN, but takes Hyzaar   . Diabetes mellitus without complication ([place])   . Abnormal Pap smear of cervix - hpv per report in 2014 with instructions to repeat in 1 year by Dr. Karlton Lemon 11/16/2013    normal pap with gyn 2015    Past Surgical History  Procedure Laterality Date  . Dorsal compartment release  08/26/2011    Procedure: RELEASE DORSAL COMPARTMENT (DEQUERVAIN);  Surgeon: Cammie Sickle., MD;  Location: [facility];  Service: Orthopedics;  Laterality:  Right;  . Foreign body removal  08/26/2011    Procedure: FOREIGN BODY REMOVAL ADULT;  Surgeon: Cammie Sickle., MD;  Location: [place];  Service: Orthopedics;  Laterality: Right;    Family History  Problem Relation Age of Onset  . Hyperlipidemia Mother   . Hypertension Mother   . Hypertension Father   . Stroke Father   . Breast cancer Paternal Grandmother     Age 61's  . Diabetes Mellitus II Paternal Grandmother   . Ovarian cancer Paternal Aunt     Age 8/58   . Ovarian cancer Other     Great Maternal Aunt   . Stroke Paternal Grandfather   . Hypertension Sister   . Heart failure Maternal Grandfather     Social History   Social History  . Marital Status: Single    Spouse Name: N/A  . Number of Children: N/A  . Years of Education: N/A   Social History Main Topics  . Smoking status: Never Smoker   . Smokeless tobacco: Never Used  . Alcohol Use: 0.0 oz/week    0 Standard drinks or equivalent per week     Comment: occasionally  . Drug Use: No  . Sexual Activity: Not Asked   Other Topics Concern  . None   Social History Narrative   Work or School: works with senior center      Home Situation: lives alone      Spiritual Beliefs: none, Darrick Meigs  Lifestyle: active at work but not otherwise; avoiding carbs              Current outpatient prescriptions:  .  acetaminophen (TYLENOL) 650 MG CR tablet, Take 650 mg by mouth every 8 (eight) hours as needed for pain., Disp: , Rfl:  .  Aloe-Sodium Chloride (AYR SALINE NASAL GEL NA), Place into the nose., Disp: , Rfl:  .  Cholecalciferol (VITAMIN D3) 400 UNITS CAPS, Take by mouth daily., Disp: , Rfl:  .  FOLIC ACID PO, Take 539 mg by mouth daily., Disp: , Rfl:  .  IBUPROFEN PO, Take by mouth., Disp: , Rfl:  .  losartan-hydrochlorothiazide (HYZAAR) 50-12.5 MG per tablet, Take 1 tablet by mouth daily. AM, Disp: 90 tablet, Rfl: 3 .  metFORMIN (GLUCOPHAGE) 1000 MG tablet, TAKE 1 TABLET BY MOUTH EVERY  MORNING AND IN THE EVENING, Disp: 60 tablet, Rfl: 2 .  norethindrone (MICRONOR,CAMILA,ERRIN) 0.35 MG tablet, Take 1 tablet (0.35 mg total) by mouth daily., Disp: 1 Package, Rfl: 9  EXAM:  Filed Vitals:   04/03/15 0829  BP: 122/78  Pulse: 82  Temp: 98.1 F (36.7 C)    GENERAL: vitals reviewed and listed below, alert, oriented, appears well hydrated and in no acute distress  HEENT: head atraumatic, PERRLA, normal appearance of eyes, ears, nose and mouth. moist mucus membranes.  NECK: supple, no masses or lymphadenopathy  LUNGS: clear to auscultation bilaterally, no rales, rhonchi or wheeze  CV: HRRR, no peripheral edema or cyanosis, normal pedal pulses  BREAST: declined - does with gyn  ABDOMEN: bowel sounds normal, soft, non tender to palpation, no masses, no rebound or guarding  GU: declined, does with gyn  SKIN: deferred - plans to do with dermatology  MS: normal gait, moves all extremities normally, minimal TTP bilat upper lumbar paraspinal muscles, gait normal  NEURO: CN II-XII grossly intact, normal muscle strength and sensation to light touch on extremities  PSYCH: normal affect, pleasant and cooperative  ASSESSMENT AND PLAN:  Discussed the following assessment and plan:  Visit for preventive health examination - Plan: Lipid Panel -Discussed and advised all [place] preventive services health task force level A and B recommendations for age, sex and risks. -Advised at least 150 minutes of exercise per week and a healthy diet low in saturated fats and sweets and consisting of fresh fruits and vegetables, lean meats such as fish and white chicken and whole grains. -labs, studies and vaccines per orders this encounter Essential hypertension - Plan: Basic metabolic panel  Type 2 diabetes mellitus without complication, without long-term current use of insulin (HCC) - Plan: Hemoglobin A1c  Body mass index (BMI) of 33.0-33.9 in adult -lifestyle recs, no aerobic exercise -  advised to  Gradually work up to 150-300 minutes of aerobic exercise per week  Low back pain without sciatica, unspecified back pain laterality - Plan: DG Lumbar Spine Complete -suspect lumbar sprain, possibly due to large bust size, vs OA; plain films, HEP, advised follow up in 4 weeks if persists   Orders Placed This Encounter  Procedures  . DG Lumbar Spine Complete    Standing Status: Future     Number of Occurrences:      Standing Expiration Date: 06/02/2016    Order Specific Question:  Reason for Exam (SYMPTOM  OR DIAGNOSIS REQUIRED)    Answer:  low back pain    Order Specific Question:  Is the patient pregnant?    Answer:  No    Order Specific Question:  Preferred imaging location?    Answer:  [facility]  . Basic metabolic panel  . Hemoglobin A1c  . Lipid Panel    Patient advised to return to clinic immediately if symptoms worsen or persist or new concerns.  Patient Instructions  BEFORE YOU LEAVE: -low back exercises -xray sheet -flu shot -labs -follow up in 4 months or soone rif back pain persists per instructions below  For the back pain: -do the exercises at least 4 days per week -heat and tylenol or ibuprofen as needed per instructions -follow up in 4 weeks if persists  -We have ordered labs or studies at this visit. It can take up to 1-2 weeks for results and processing. We will contact you with instructions IF your results are abnormal. Normal results will be released to your [facility]. If you have not heard from [place] or can not find your results in [facility] in 2 weeks please contact our office.  We recommend the following healthy lifestyle measures: - eat a healthy whole foods diet consisting of regular small meals composed of vegetables, fruits, beans, nuts, seeds, healthy meats such as white chicken and fish and whole grains.  - avoid sweets, white starchy foods, fried foods, fast food, processed foods, sodas, red meet and other fattening foods.  - get a least  150-300 minutes of aerobic exercise per week.           No Follow-up on file.  Colin Benton R.

## 2015-04-03 NOTE — Progress Notes (Signed)
Pre visit review using our clinic review tool, if applicable. No additional management support is needed unless otherwise documented below in the visit note. 

## 2015-04-08 ENCOUNTER — Telehealth: Payer: Self-pay | Admitting: *Deleted

## 2015-04-08 NOTE — Telephone Encounter (Signed)
Yes, this medication is not known to cause constipation, so if constipation is new should set up appointment to discus and eval. Thanks.

## 2015-04-08 NOTE — Telephone Encounter (Signed)
I called the pt to discuss the test results with her and she wanted to let Dr Maudie Mercury know she is constipated since starting Metformin for the past month or two?  States she was told most people have diarrhea with this but she has noticed this.

## 2015-04-08 NOTE — Telephone Encounter (Signed)
I called the pt and informed her of this and scheduled an appt for 11/17.

## 2015-04-17 ENCOUNTER — Encounter: Payer: Self-pay | Admitting: Family Medicine

## 2015-04-17 ENCOUNTER — Ambulatory Visit (INDEPENDENT_AMBULATORY_CARE_PROVIDER_SITE_OTHER): Payer: 59 | Admitting: Family Medicine

## 2015-04-17 VITALS — BP 128/84 | HR 85 | Temp 98.4°F | Ht 65.75 in | Wt 211.2 lb

## 2015-04-17 DIAGNOSIS — K5900 Constipation, unspecified: Secondary | ICD-10-CM | POA: Diagnosis not present

## 2015-04-17 NOTE — Patient Instructions (Signed)
Please take a fiber supplement daily (metameucil or citracel)  Take mirilax if needed if you become constipated  Please restart the metformin and work back up to your regular dose  Follow up if constipation recurs or other concerns

## 2015-04-17 NOTE — Progress Notes (Signed)
Pre visit review using our clinic review tool, if applicable. No additional management support is needed unless otherwise documented below in the visit note. 

## 2015-04-17 NOTE — Progress Notes (Signed)
HPI:  Acute visit for:  Constipation: -she had increased metformin and noticed her stools were a little harder -denies abd pain, hematochezia, melena -she stopped her metformin and bowels now normal again  Back pain better.  ROS: See pertinent positives and negatives per HPI.  Past Medical History  Diagnosis Date  . Headache(784.0)     sinus  . Arthritis     hand  . GERD (gastroesophageal reflux disease)     no current med.  . 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 Foreign body of hand, right 07/2011    right MP  . Hypertension     denies HTN, but takes Hyzaar   . Diabetes mellitus without complication ([place])   . Abnormal Pap smear of cervix - hpv per report in 2014 with instructions to repeat in 1 year by Dr. Karlton Lemon 11/16/2013    normal pap with gyn 2015    Past Surgical History  Procedure Laterality Date  . Dorsal compartment release  08/26/2011    Procedure: RELEASE DORSAL COMPARTMENT (DEQUERVAIN);  Surgeon: Cammie Sickle., MD;  Location: [facility];  Service: Orthopedics;  Laterality: Right;  . Foreign body removal  08/26/2011    Procedure: FOREIGN BODY REMOVAL ADULT;  Surgeon: Cammie Sickle., MD;  Location: [place];  Service: Orthopedics;  Laterality: Right;    Family History  Problem Relation Age of Onset  . Hyperlipidemia Mother   . Hypertension Mother   . Hypertension Father   . Stroke Father   . Breast cancer Paternal Grandmother     Age 34's  . Diabetes Mellitus II Paternal Grandmother   . Ovarian cancer Paternal Aunt     Age 64/58   . Ovarian cancer Other     Great Maternal Aunt   . Stroke Paternal Grandfather   . Hypertension Sister   . Heart failure Maternal Grandfather     Social History   Social History  . Marital Status: Single    Spouse Name: N/A  . Number of Children: N/A  . Years of Education: N/A   Social History Main Topics  . Smoking status: Never Smoker   . Smokeless  tobacco: Never Used  . Alcohol Use: 0.0 oz/week    0 Standard drinks or equivalent per week     Comment: occasionally  . Drug Use: No  . Sexual Activity: Not Asked   Other Topics Concern  . None   Social History Narrative   Work or School: works with senior center      Home Situation: lives alone      Spiritual Beliefs: none, Christian      Lifestyle: active at work but not otherwise; avoiding carbs              Current outpatient prescriptions:  .  acetaminophen (TYLENOL) 650 MG CR tablet, Take 650 mg by mouth every 8 (eight) hours as needed for pain., Disp: , Rfl:  .  Aloe-Sodium Chloride (AYR SALINE NASAL GEL NA), Place into the nose., Disp: , Rfl:  .  Cholecalciferol (VITAMIN D3) 400 UNITS CAPS, Take by mouth daily., Disp: , Rfl:  .  FOLIC ACID PO, Take [id number] mg by mouth daily., Disp: , Rfl:  .  IBUPROFEN PO, Take by mouth., Disp: , Rfl:  .  losartan-hydrochlorothiazide (HYZAAR) 50-12.5 MG per tablet, Take 1 tablet by mouth daily. AM, Disp: 90 tablet, Rfl: 3 .  metFORMIN (GLUCOPHAGE) 1000 MG tablet, TAKE  1 TABLET BY MOUTH EVERY MORNING AND IN THE EVENING, Disp: 60 tablet, Rfl: 2 .  mometasone (ELOCON) 0.1 % cream, Apply 1 application topically daily., Disp: 45 g, Rfl: 1 .  norethindrone (MICRONOR,CAMILA,ERRIN) 0.35 MG tablet, Take 1 tablet (0.35 mg total) by mouth daily., Disp: 1 Package, Rfl: 9  EXAM:  Filed Vitals:   04/17/15 0915  BP: 128/90  Pulse: 85  Temp: 98.4 F (36.9 C)    Body mass index is 34.35 kg/(m^2).  GENERAL: vitals reviewed and listed above, alert, oriented, appears well hydrated and in no acute distress  HEENT: atraumatic, conjunttiva clear, no obvious abnormalities on inspection of external nose and ears  NECK: no obvious masses on inspection  LUNGS: clear to auscultation bilaterally, no wheezes, rales or rhonchi, good air movement  CV: HRRR, no peripheral edema  ABD Soft, NTTP, BS normal  MS: moves all extremities without noticeable  abnormality  PSYCH: pleasant and cooperative, no obvious depression or anxiety  ASSESSMENT AND PLAN:  Discussed the following assessment and plan:  Constipation, unspecified constipation type  -opted to restart metformin and use fiber supplement/mirilax if needed -follow up if constipation recurs -she is drinking a lot of sweet tea again, opted to wean back to water -Patient advised to return or notify a doctor immediately if symptoms worsen or persist or new concerns arise.  Patient Instructions  Please take a fiber supplement daily (metameucil or citracel)  Take mirilax if needed if you become constipated  Please restart the metformin and work back up to your regular dose  Follow up if constipation recurs or other concerns     KIM, Jarrett Soho R.

## 2015-04-21 ENCOUNTER — Other Ambulatory Visit: Payer: Self-pay | Admitting: Family Medicine

## 2015-06-04 ENCOUNTER — Telehealth: Payer: Self-pay | Admitting: Family Medicine

## 2015-06-04 ENCOUNTER — Ambulatory Visit: Payer: 59 | Admitting: Nurse Practitioner

## 2015-06-04 NOTE — Telephone Encounter (Signed)
I called the pt and scheduled an appt for tomorrow at 11:15am. 

## 2015-06-04 NOTE — Telephone Encounter (Signed)
She could try delsym or musinex for cough, nasal saline and humidifier at night if simply a cold. If feeling worse, persistent symptoms or she is concerned and feels needs prescription medication would advise office visit. Thank you.

## 2015-06-04 NOTE — Telephone Encounter (Signed)
Patient called stating that she's been coughing up some green and yellowish mucousy things. Pt was wondering if there was anything to help her out. Pt was advised that Dr. Maudie Mercury might want to see her before prescribing her anything. Pt still wanted to ask if something could be called in. Pt uses the [facility] on [place]. Please advise.

## 2015-06-04 NOTE — Telephone Encounter (Signed)
Dr Kim-I called the pt and informed her of the message below and she stated she is concerned about the green drainage she coughs up in the am only, denies a fever or chills.  States she would like to come in for an appt.  Can I use a same day spot for Thursday or schedule with another provider?

## 2015-06-04 NOTE — Telephone Encounter (Signed)
Ok to use same day with me. Thanks.

## 2015-06-05 ENCOUNTER — Ambulatory Visit (INDEPENDENT_AMBULATORY_CARE_PROVIDER_SITE_OTHER): Payer: 59 | Admitting: Family Medicine

## 2015-06-05 ENCOUNTER — Encounter: Payer: Self-pay | Admitting: Family Medicine

## 2015-06-05 ENCOUNTER — Ambulatory Visit: Payer: 59 | Admitting: Nurse Practitioner

## 2015-06-05 VITALS — BP 126/84 | HR 100 | Temp 97.7°F | Ht 65.75 in | Wt 212.3 lb

## 2015-06-05 DIAGNOSIS — J329 Chronic sinusitis, unspecified: Secondary | ICD-10-CM

## 2015-06-05 MED ORDER — AMOXICILLIN-POT CLAVULANATE 875-125 MG PO TABS: 1.0000 | ORAL_TABLET | Freq: Two times a day (BID) | ORAL | Status: DC

## 2015-06-05 NOTE — Patient Instructions (Signed)
INSTRUCTIONS FOR UPPER RESPIRATORY INFECTION:  -if worsening or not improving with the following measures, please [place] the antibiotic as instructed.  -plenty of rest and fluids  -nasal saline wash 2-3 times daily (use prepackaged nasal saline or bottled/distilled water if making your own)   -can use AFRIN nasal spray for drainage and nasal congestion - but do NOT use longer then 3-4 days  -can use tylenol (in no history of liver disease) or ibuprofen (if no history of kidney disease, bowel bleeding or significant heart disease) as directed for aches and sorethroat  -in the winter time, using a humidifier at night is helpful (please follow cleaning instructions)  -if you are taking a cough medication - use only as directed, may also try a teaspoon of honey to coat the throat and throat lozenges. If given a cough medication with codeine or hydrocodone or other narcotic please be advised that this contains a strong and  potentially addicting medication. Please follow instructions carefully, take as little as possible and only use AS NEEDED for severe cough. Discuss potential side effects with your pharmacy. Please do not drive or operate machinery while taking these types of medications. Please do not take other sedating medications, drugs or alcohol while taking this medication without discussing with your doctor.  -for sore throat, salt water gargles can help  -follow up if you have fevers, facial pain, tooth pain, difficulty breathing or are worsening or symptoms persist longer then expected  Upper Respiratory Infection, Adult An upper respiratory infection (URI) is also known as the common cold. It is often caused by a type of germ (virus). Colds are easily spread (contagious). You can pass it to others by kissing, coughing, sneezing, or drinking out of the same glass. Usually, you get better in 1 to 3  weeks.  However, the cough can last for even longer. HOME CARE   Only take medicine as told  by your doctor. Follow instructions provided above.  Drink enough water and fluids to keep your pee (urine) clear or pale yellow.  Get plenty of rest.  Return to work when your temperature is < 100 for 24 hours or as told by your doctor. You may use a face mask and wash your hands to stop your cold from spreading. GET HELP RIGHT AWAY IF:   After the first few days, you feel you are getting worse.  You have questions about your medicine.  You have chills, shortness of breath, or red spit (mucus).  You have pain in the face for more then 1-2 days, especially when you bend forward.  You have a fever, puffy (swollen) neck, pain when you swallow, or white spots in the back of your throat.  You have a bad headache, ear pain, sinus pain, or chest pain.  You have a high-pitched whistling sound when you breathe in and out (wheezing).  You cough up blood.  You have sore muscles or a stiff neck. MAKE SURE YOU:   Understand these instructions.  Will watch your condition.  Will get help right away if you are not doing well or get worse. Document Released: 11/03/2007 Document Revised: 08/09/2011 Document Reviewed: 08/22/2013 [facility] Patient Information 2015 [place], [place]. This information is not intended to replace advice given to you by your health care provider. Make sure you discuss any questions you have with your health care provider.

## 2015-06-05 NOTE — Progress Notes (Addendum)
HPI:  URI: -started: 10-12 days ago -symptoms:nasal congestion, sore throat, cough, sinus pressure, finally feeling a little better today -denies:fever, SOB, NVD, tooth pain -has tried: nyquil and nasal saline -sick contacts/travel/risks: denies flu exposure, tick exposure or or Ebola risks  ROS: See pertinent positives and negatives per HPI.  Past Medical History  Diagnosis Date  . Headache(784.0)     sinus  . Arthritis     hand  . GERD (gastroesophageal reflux disease)     no current med.  . 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 Foreign body of hand, right 07/2011    right MP  . Hypertension     denies HTN, but takes Hyzaar   . Diabetes mellitus without complication ([place])   . Abnormal Pap smear of cervix - hpv per report in 2014 with instructions to repeat in 1 year by Dr. Karlton Lemon 11/16/2013    normal pap with gyn 2015    Past Surgical History  Procedure Laterality Date  . Dorsal compartment release  08/26/2011    Procedure: RELEASE DORSAL COMPARTMENT (DEQUERVAIN);  Surgeon: Cammie Sickle., MD;  Location: [facility];  Service: Orthopedics;  Laterality: Right;  . Foreign body removal  08/26/2011    Procedure: FOREIGN BODY REMOVAL ADULT;  Surgeon: Cammie Sickle., MD;  Location: [place];  Service: Orthopedics;  Laterality: Right;    Family History  Problem Relation Age of Onset  . Hyperlipidemia Mother   . Hypertension Mother   . Hypertension Father   . Stroke Father   . Breast cancer Paternal Grandmother     Age 55's  . Diabetes Mellitus II Paternal Grandmother   . Ovarian cancer Paternal Aunt     Age 81/58   . Ovarian cancer Other     Great Maternal Aunt   . Stroke Paternal Grandfather   . Hypertension Sister   . Heart failure Maternal Grandfather     Social History   Social History  . Marital Status: Single    Spouse Name: N/A  . Number of Children: N/A  . Years of Education: N/A   Social  History Main Topics  . Smoking status: Never Smoker   . Smokeless tobacco: Never Used  . Alcohol Use: 0.0 oz/week    0 Standard drinks or equivalent per week     Comment: occasionally  . Drug Use: No  . Sexual Activity: Not Asked   Other Topics Concern  . None   Social History Narrative   Work or School: works with senior center      Home Situation: lives alone      Spiritual Beliefs: none, Christian      Lifestyle: active at work but not otherwise; avoiding carbs              Current outpatient prescriptions:  .  acetaminophen (TYLENOL) 650 MG CR tablet, Take 650 mg by mouth every 8 (eight) hours as needed for pain., Disp: , Rfl:  .  Aloe-Sodium Chloride (AYR SALINE NASAL GEL NA), Place into the nose., Disp: , Rfl:  .  Cholecalciferol (VITAMIN D3) 400 UNITS CAPS, Take by mouth daily., Disp: , Rfl:  .  FOLIC ACID PO, Take A999333 mg by mouth daily., Disp: , Rfl:  .  IBUPROFEN PO, Take by mouth., Disp: , Rfl:  .  losartan-hydrochlorothiazide (HYZAAR) 50-12.5 MG per tablet, Take 1 tablet by mouth daily. AM, Disp: 90 tablet, Rfl: 3 .  metFORMIN (GLUCOPHAGE) 1000 MG tablet, TAKE 1 TABLET BY MOUTH EVERY MORNING AND IN THE EVENING, Disp: 60 tablet, Rfl: 3 .  mometasone (ELOCON) 0.1 % cream, Apply 1 application topically daily., Disp: 45 g, Rfl: 1 .  norethindrone (MICRONOR,CAMILA,ERRIN) 0.35 MG tablet, Take 1 tablet (0.35 mg total) by mouth daily., Disp: 1 Package, Rfl: 9 .  amoxicillin-clavulanate (AUGMENTIN) 875-125 MG tablet, Take 1 tablet by mouth 2 (two) times daily., Disp: 14 tablet, Rfl: 0  EXAM:  Filed Vitals:   06/05/15 1120  BP: 126/84  Pulse: 100  Temp: 97.7 F (36.5 C)    Body mass index is 34.53 kg/(m^2).  GENERAL: vitals reviewed and listed above, alert, oriented, appears well hydrated and in no acute distress  HEENT: atraumatic, conjunttiva clear, no obvious abnormalities on inspection of external nose and ears, normal appearance of ear canals and TMs, thick  nasal congestion, mild post oropharyngeal erythema with PND, no tonsillar edema or exudate, no sinus TTP  NECK: no obvious masses on inspection  LUNGS: clear to auscultation bilaterally, no wheezes, rales or rhonchi, good air movement  CV: HRRR, no peripheral edema  MS: moves all extremities without noticeable abnormality  PSYCH: pleasant and cooperative, no obvious depression or anxiety  ASSESSMENT AND PLAN:  Discussed the following assessment and plan:  Rhinosinusitis  -given HPI and exam findings today, a serious infection or illness is unlikely. We discussed potential etiologies, with VURI being most likely given starting to improve, and advised supportive care and monitoring. She is worried could be a sinus infection. We discussed treatment side effects, likely course, antibiotic misuse, transmission, and signs of developing a serious illness. Opted for tx per instructions and delayed abx if worsening or does not continue to improve. -of course, we advised to return or notify a doctor immediately if symptoms worsen or persist or new concerns arise.    Patient Instructions  INSTRUCTIONS FOR UPPER RESPIRATORY INFECTION:  -if worsening or not improving with the following measures, please [place] the antibiotic as instructed.  -plenty of rest and fluids  -nasal saline wash 2-3 times daily (use prepackaged nasal saline or bottled/distilled water if making your own)   -can use AFRIN nasal spray for drainage and nasal congestion - but do NOT use longer then 3-4 days  -can use tylenol (in no history of liver disease) or ibuprofen (if no history of kidney disease, bowel bleeding or significant heart disease) as directed for aches and sorethroat  -in the winter time, using a humidifier at night is helpful (please follow cleaning instructions)  -if you are taking a cough medication - use only as directed, may also try a teaspoon of honey to coat the throat and throat lozenges. If given a  cough medication with codeine or hydrocodone or other narcotic please be advised that this contains a strong and  potentially addicting medication. Please follow instructions carefully, take as little as possible and only use AS NEEDED for severe cough. Discuss potential side effects with your pharmacy. Please do not drive or operate machinery while taking these types of medications. Please do not take other sedating medications, drugs or alcohol while taking this medication without discussing with your doctor.  -for sore throat, salt water gargles can help  -follow up if you have fevers, facial pain, tooth pain, difficulty breathing or are worsening or symptoms persist longer then expected  Upper Respiratory Infection, Adult An upper respiratory infection (URI) is also known as the common cold. It is often caused by a  type of germ (virus). Colds are easily spread (contagious). You can pass it to others by kissing, coughing, sneezing, or drinking out of the same glass. Usually, you get better in 1 to 3  weeks.  However, the cough can last for even longer. HOME CARE   Only take medicine as told by your doctor. Follow instructions provided above.  Drink enough water and fluids to keep your pee (urine) clear or pale yellow.  Get plenty of rest.  Return to work when your temperature is < 100 for 24 hours or as told by your doctor. You may use a face mask and wash your hands to stop your cold from spreading. GET HELP RIGHT AWAY IF:   After the first few days, you feel you are getting worse.  You have questions about your medicine.  You have chills, shortness of breath, or red spit (mucus).  You have pain in the face for more then 1-2 days, especially when you bend forward.  You have a fever, puffy (swollen) neck, pain when you swallow, or white spots in the back of your throat.  You have a bad headache, ear pain, sinus pain, or chest pain.  You have a high-pitched whistling sound when you  breathe in and out (wheezing).  You cough up blood.  You have sore muscles or a stiff neck. MAKE SURE YOU:   Understand these instructions.  Will watch your condition.  Will get help right away if you are not doing well or get worse. Document Released: 11/03/2007 Document Revised: 08/09/2011 Document Reviewed: 08/22/2013 [facility] Patient Information 2015 [place], [place]. This information is not intended to replace advice given to you by your health care provider. Make sure you discuss any questions you have with your health care provider.       Colin Benton R.

## 2015-06-05 NOTE — Progress Notes (Signed)
Pre visit review using our clinic review tool, if applicable. No additional management support is needed unless otherwise documented below in the visit note. 

## 2015-06-16 ENCOUNTER — Other Ambulatory Visit: Payer: Self-pay | Admitting: Family Medicine

## 2015-06-17 ENCOUNTER — Telehealth: Payer: Self-pay | Admitting: Nurse Practitioner

## 2015-06-17 NOTE — Telephone Encounter (Signed)
Patient called and cancelled her appointment for an AEX on 06/18/15 with Patty. She said, "I just started my menstrual cycle and I am also sick with a cold. I just left the doctor's office." Patient will call back to reschedule when feeling better.

## 2015-06-18 ENCOUNTER — Ambulatory Visit: Payer: 59 | Admitting: Nurse Practitioner

## 2015-07-16 ENCOUNTER — Other Ambulatory Visit: Payer: Self-pay

## 2015-07-16 DIAGNOSIS — Z1231 Encounter for screening mammogram for malignant neoplasm of breast: Secondary | ICD-10-CM

## 2015-07-29 ENCOUNTER — Ambulatory Visit
Admission: RE | Admit: 2015-07-29 | Discharge: 2015-07-29 | Disposition: A | Discharge status: Home / Self Care | Payer: 59 | Source: Ambulatory Visit

## 2015-07-29 DIAGNOSIS — Z1231 Encounter for screening mammogram for malignant neoplasm of breast: Secondary | ICD-10-CM

## 2015-08-01 ENCOUNTER — Ambulatory Visit (INDEPENDENT_AMBULATORY_CARE_PROVIDER_SITE_OTHER): Payer: 59 | Admitting: Family Medicine

## 2015-08-01 DIAGNOSIS — R69 Illness, unspecified: Secondary | ICD-10-CM

## 2015-08-01 DIAGNOSIS — E669 Obesity, unspecified: Secondary | ICD-10-CM | POA: Insufficient documentation

## 2015-08-01 DIAGNOSIS — Z8742 Personal history of other diseases of the female genital tract: Secondary | ICD-10-CM | POA: Insufficient documentation

## 2015-08-01 NOTE — Progress Notes (Signed)
NO show

## 2015-08-06 ENCOUNTER — Ambulatory Visit (INDEPENDENT_AMBULATORY_CARE_PROVIDER_SITE_OTHER): Payer: 59 | Admitting: Nurse Practitioner

## 2015-08-06 ENCOUNTER — Encounter: Payer: Self-pay | Admitting: Nurse Practitioner

## 2015-08-06 VITALS — BP 118/64 | HR 74 | Resp 18 | Ht 65.5 in | Wt 207.8 lb

## 2015-08-06 DIAGNOSIS — Z01419 Encounter for gynecological examination (general) (routine) without abnormal findings: Secondary | ICD-10-CM | POA: Diagnosis not present

## 2015-08-06 DIAGNOSIS — Z0000 Encounter for general adult medical examination without abnormal findings: Secondary | ICD-10-CM | POA: Diagnosis not present

## 2015-08-06 MED ORDER — NORETHINDRONE 0.35 MG PO TABS: 1.0000 | ORAL_TABLET | Freq: Every day | ORAL | Status: DC

## 2015-08-06 NOTE — Progress Notes (Signed)
40 y.o. G0P0 Single  African American Fe here for annual exam.  Menses last 4-5 days.  Moderate to light.  No cramps.  Not dating or SA 3-4 yrs.  Patient's last menstrual period was 07/10/2015 (exact date).          Sexually active: No.  The current method of family planning is progesterone only OCP. Exercising: Yes.      Smoker:  no  Health Maintenance: Pap:  05-21-14, wnl, neg HR HPV MMG:  07-29-15 Category C, Bi=Rads 1 Neg  TDaP:  03-19-13 Labs: PCP   reports that she has never smoked. She has never used smokeless tobacco. She reports that she drinks alcohol. She reports that she does not use illicit drugs.  Past Medical History  Diagnosis Date  . Headache(784.0)     sinus  . Arthritis     hand  . GERD (gastroesophageal reflux disease)     no current med.  . 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 Foreign body of hand, right 07/2011    right MP  . Hypertension     denies HTN, but takes Hyzaar   . Diabetes mellitus without complication ([place])   . Abnormal Pap smear of cervix - hpv per report in 2014 with instructions to repeat in 1 year by Dr. Karlton Lemon 11/16/2013    normal pap with gyn 2015    Past Surgical History  Procedure Laterality Date  . Dorsal compartment release  08/26/2011    Procedure: RELEASE DORSAL COMPARTMENT (DEQUERVAIN);  Surgeon: Cammie Sickle., MD;  Location: [facility];  Service: Orthopedics;  Laterality: Right;  . Foreign body removal  08/26/2011    Procedure: FOREIGN BODY REMOVAL ADULT;  Surgeon: Cammie Sickle., MD;  Location: [place];  Service: Orthopedics;  Laterality: Right;    Current Outpatient Prescriptions  Medication Sig Dispense Refill  . acetaminophen (TYLENOL) 650 MG CR tablet Take 650 mg by mouth every 8 (eight) hours as needed for pain.    . Aloe-Sodium Chloride (AYR SALINE NASAL GEL NA) Place into the nose.    Marland Kitchen amoxicillin-clavulanate (AUGMENTIN) 875-125 MG tablet Take 1 tablet by  mouth 2 (two) times daily. 14 tablet 0  . Cholecalciferol (VITAMIN D3) 400 UNITS CAPS Take by mouth daily.    Marland Kitchen FOLIC ACID PO Take A999333 mg by mouth daily.    . IBUPROFEN PO Take by mouth.    . losartan-hydrochlorothiazide (HYZAAR) 50-12.5 MG tablet TAKE 1 TABLET BY MOUTH DAILY EVERY MORNING 90 tablet 3  . metFORMIN (GLUCOPHAGE) 1000 MG tablet TAKE 1 TABLET BY MOUTH EVERY MORNING AND IN THE EVENING 60 tablet 3  . mometasone (ELOCON) 0.1 % cream Apply 1 application topically daily. 45 g 1  . norethindrone (MICRONOR,CAMILA,ERRIN) 0.35 MG tablet Take 1 tablet (0.35 mg total) by mouth daily. 1 Package 9   No current facility-administered medications for this visit.    Family History  Problem Relation Age of Onset  . Hyperlipidemia Mother   . Hypertension Mother   . Hypertension Father   . Stroke Father   . Breast cancer Paternal Grandmother     Age 62's  . Diabetes Mellitus II Paternal Grandmother   . Ovarian cancer Paternal Aunt     Age 17/58   . Ovarian cancer Other     Great Maternal Aunt   . Stroke Paternal Grandfather   . Hypertension Sister   . Heart failure Maternal Grandfather  ROS:  Pertinent items are noted in HPI.  Otherwise, a comprehensive ROS was negative.  Exam:   BP 118/64 mmHg  Pulse 74  Resp 18  Ht 5' 5.5" (1.664 m)  Wt 207 lb 12.8 oz (94.257 kg)  BMI 34.04 kg/m2  LMP 07/10/2015 (Exact Date) Height: 5' 5.5" (166.4 cm) Ht Readings from Last 3 Encounters:  08/06/15 5' 5.5" (1.664 m)  06/05/15 5' 5.75" (1.67 m)  04/17/15 5' 5.75" (1.67 m)    General appearance: alert, cooperative and appears stated age Head: Normocephalic, without obvious abnormality, atraumatic Neck: no adenopathy, supple, symmetrical, trachea midline and thyroid normal to inspection and palpation Lungs: clear to auscultation bilaterally Breasts: normal appearance, no masses or tenderness Heart: regular rate and rhythm Abdomen: soft, non-tender; no masses,  no  organomegaly Extremities: extremities normal, atraumatic, no cyanosis or edema Skin: Skin color, texture, turgor normal. No rashes or lesions Lymph nodes: Cervical, supraclavicular, and axillary nodes normal. No abnormal inguinal nodes palpated Neurologic: Grossly normal   Pelvic: External genitalia:  no lesions              Urethra:  normal appearing urethra with no masses, tenderness or lesions              Bartholin's and Skene's: normal                 Vagina: normal appearing vagina with normal color and discharge, no lesions              Cervix: anteverted              Pap taken: No. Bimanual Exam:  Uterus:  normal size, contour, position, consistency, mobility, non-tender              Adnexa: no mass, fullness, tenderness               Rectovaginal: Confirms               Anus:  normal sphincter tone, no lesions  Chaperone present: no  A:  Well Woman with normal exam  History of regular menses on POP History of HTN and DM    P:   Reviewed health and wellness pertinent to exam  Pap smear as above  Mammogram is due 07/2016  Refill on POP for a year  Counseled on breast self exam, mammography screening, adequate intake of calcium and vitamin D, diet and exercise return annually or prn  An After Visit Summary was printed and given to the patient.

## 2015-08-06 NOTE — Patient Instructions (Addendum)

## 2015-08-10 ENCOUNTER — Other Ambulatory Visit: Payer: Self-pay | Admitting: Family Medicine

## 2015-08-10 NOTE — Progress Notes (Signed)
Encounter reviewed by Dr. Nalina Yeatman Amundson C. Silva.  

## 2015-10-07 ENCOUNTER — Ambulatory Visit (INDEPENDENT_AMBULATORY_CARE_PROVIDER_SITE_OTHER): Payer: 59 | Admitting: Nurse Practitioner

## 2015-10-07 ENCOUNTER — Encounter: Payer: Self-pay | Admitting: Nurse Practitioner

## 2015-10-07 VITALS — BP 110/70 | HR 88 | Resp 16 | Ht 66.0 in | Wt 213.0 lb

## 2015-10-07 DIAGNOSIS — N926 Irregular menstruation, unspecified: Secondary | ICD-10-CM

## 2015-10-07 LAB — POCT URINE PREGNANCY: PREG TEST UR: NEGATIVE

## 2015-10-07 MED ORDER — ORTHO MICRONOR 0.35 MG PO TABS: 1.0000 | ORAL_TABLET | Freq: Every day | ORAL | Status: DC

## 2015-10-07 NOTE — Patient Instructions (Signed)

## 2015-10-07 NOTE — Progress Notes (Signed)
Subjective:     Patient ID: Linda Conrad, female   DOB: 23-May-1976, 40 y.o.   MRN: [id number]  HPI  This 89 G0P0 single African American Female presents for a consult about irregular menses.  Her previous menses was 08/08/15 and in  April started 4/29 until present.  Flow is moderate to light. She did stop her pill 2 days ago.  With last AEX on 08/06/15 she was given refill on POP.   But she was given Camilla instead of Micronor.  She noted the first pack gave her an  increase in breast tenderness and shooting pain in both breast.  Then started a second pack with April cycle and has not stopped bleeding.  She is using POP for cycle regulation.  Prior use of OCP until diagnosis of HTN and change to POP 2/ 2016.  She is not dating and not SA in 3-4 yrs.  Review of Systems  Constitutional: Negative.   HENT: Negative.   Respiratory: Negative.   Cardiovascular: Negative.   Gastrointestinal: Negative.   Genitourinary: Positive for vaginal bleeding and menstrual problem.  Musculoskeletal: Negative.   Skin: Negative.   Neurological: Negative.        Objective:   Physical Exam  Constitutional: She appears well-nourished. No distress.  Psychiatric: She has a normal mood and affect. Her behavior is normal. Judgment and thought content normal.  Vitals reviewed. UPT:  negative     Assessment:     AUB on a change of POP from Micronor to [place]:     Consulted with Dr. Sabra Heck.  Will have her stay off Camilla and give [place] a call on Monday.  Her POP is changed to Micronor only and sent back to the pharmacy.  If continues any bleeding or worsens to CB in the interim.  Pt agrees with plan.     Consult time:  15 minutes

## 2015-10-08 NOTE — Progress Notes (Signed)
Reviewed personally.  M. Suzanne Anica Alcaraz, MD.  

## 2015-10-18 ENCOUNTER — Encounter: Payer: Self-pay | Admitting: Family Medicine

## 2015-10-18 ENCOUNTER — Ambulatory Visit (INDEPENDENT_AMBULATORY_CARE_PROVIDER_SITE_OTHER): Payer: 59 | Admitting: Family Medicine

## 2015-10-18 VITALS — BP 110/76 | HR 78 | Temp 97.8°F | Wt 208.5 lb

## 2015-10-18 DIAGNOSIS — J069 Acute upper respiratory infection, unspecified: Secondary | ICD-10-CM

## 2015-10-18 MED ORDER — AMOXICILLIN-POT CLAVULANATE 875-125 MG PO TABS: 1.0000 | ORAL_TABLET | Freq: Two times a day (BID) | ORAL | Status: DC

## 2015-10-18 NOTE — Assessment & Plan Note (Signed)
New acute problem. Appears to be viral in nature. Advised supportive care. Augmentin to be filled if she fails to improve.

## 2015-10-18 NOTE — Progress Notes (Signed)
Subjective:  Patient ID: Linda Conrad, female    DOB: [birth year]  Age: 40 y.o. MRN: [id number]  CC: ? Sinusitis  HPI:  40 year old female presents with concern for sinusitis.  Patient states that she's been sick for the past 3 days. She has been experiencing cough, sinus pressure and congestion as well as facial pain. She's had some clear mucus from her nose. It has been slightly blood tinged. No fever or chills. She has been taking Mucinex with little improvement. Additionally, she has a roaring in her left ear. She states that she has an upcoming appointment with ENT regarding this. No other complaints at this time. No known exacerbating factors.  Social Hx   Social History   Social History  . Marital Status: Single    Spouse Name: N/A  . Number of Children: N/A  . Years of Education: N/A   Social History Main Topics  . Smoking status: Never Smoker   . Smokeless tobacco: Never Used  . Alcohol Use: 0.0 oz/week    0 Standard drinks or equivalent per week     Comment: occasionally  . Drug Use: No  . Sexual Activity: No   Other Topics Concern  . None   Social History Narrative   Work or School: works with senior center      Home Situation: lives alone      Spiritual Beliefs: none, Christian      Lifestyle: active at work but not otherwise; avoiding carbs            Review of Systems  Constitutional: Negative for fever.  HENT: Positive for congestion and sinus pressure.     Objective:  BP 110/76 mmHg  Pulse 78  Temp(Src) 97.8 F (36.6 C) (Oral)  Wt 208 lb 8 oz (94.575 kg)  SpO2 98%  LMP 09/27/2015  BP/Weight 10/18/2015 [id number]  Systolic BP A999333 [id number]  Diastolic BP 76 70 64  Wt. (Lbs) 208.5 213 207.8  BMI 33.67 34.4 34.04    Physical Exam  Constitutional: She is oriented to person, place, and time. She appears well-developed. No distress.  HENT:  Head: Normocephalic and atraumatic.  Oropharynx with mild erythema. Normal TMs  bilaterally. Enlarged/edematous nasal turbinates noted.  Eyes: Conjunctivae are normal.  Neck: Neck supple.  Cardiovascular: Normal rate and regular rhythm.   Pulmonary/Chest: Effort normal and breath sounds normal.  Lymphadenopathy:    She has no cervical adenopathy.  Neurological: She is alert and oriented to person, place, and time.  Psychiatric: She has a normal mood and affect.  Vitals reviewed.   Lab Results  Component Value Date   HGB 11.3* 08/26/2011   GLUCOSE 91 04/03/2015   CHOL 151 04/03/2015   TRIG 46.0 04/03/2015   HDL 59.30 04/03/2015   LDLCALC 83 04/03/2015   NA 138 04/03/2015   K 4.2 04/03/2015   CL 104 04/03/2015   CREATININE 0.86 04/03/2015   BUN 14 04/03/2015   CO2 27 04/03/2015   HGBA1C 5.6 04/03/2015   MICROALBUR 0.4 01/02/2014    Assessment & Plan:   Problem List Items Addressed This Visit    URI (upper respiratory infection) - Primary    New acute problem. Appears to be viral in nature. Advised supportive care. Augmentin to be filled if she fails to improve.         Meds ordered this encounter  Medications  . vitamin B-12 (CYANOCOBALAMIN) 1000 MCG tablet    Sig: Take 1,000 mcg by mouth daily.  Marland Kitchen  amoxicillin-clavulanate (AUGMENTIN) 875-125 MG tablet    Sig: Take 1 tablet by mouth 2 (two) times daily.    Dispense:  20 tablet    Refill:  0   Follow-up: PRN  [place]

## 2015-10-18 NOTE — Progress Notes (Signed)
Pre visit review using our clinic review tool, if applicable. No additional management support is needed unless otherwise documented below in the visit note. 

## 2015-10-18 NOTE — Patient Instructions (Signed)
This is likely viral.  Continue use of over-the-counter remedies and nasal saline.  If you fail to improve you may feel the antibiotic.  You should follow-up with ENT regarding the ringing in the ears.  Take care  Dr. Lacinda Axon

## 2015-11-17 ENCOUNTER — Encounter: Payer: Self-pay | Admitting: Family Medicine

## 2015-11-17 ENCOUNTER — Ambulatory Visit (INDEPENDENT_AMBULATORY_CARE_PROVIDER_SITE_OTHER): Payer: 59 | Admitting: Family Medicine

## 2015-11-17 VITALS — BP 124/88 | HR 98 | Temp 98.2°F | Ht 66.0 in | Wt 209.6 lb

## 2015-11-17 DIAGNOSIS — I10 Essential (primary) hypertension: Secondary | ICD-10-CM | POA: Diagnosis not present

## 2015-11-17 DIAGNOSIS — Z6833 Body mass index (BMI) 33.0-33.9, adult: Secondary | ICD-10-CM | POA: Diagnosis not present

## 2015-11-17 DIAGNOSIS — E119 Type 2 diabetes mellitus without complications: Secondary | ICD-10-CM | POA: Diagnosis not present

## 2015-11-17 LAB — CBC
HEMATOCRIT: 36.5 % (ref 36.0–46.0)
Hemoglobin: 12.2 g/dL (ref 12.0–15.0)
MCHC: 33.3 g/dL (ref 30.0–36.0)
MCV: 82.7 fl (ref 78.0–100.0)
PLATELETS: 311 10*3/uL (ref 150.0–400.0)
RBC: 4.42 Mil/uL (ref 3.87–5.11)
RDW: 14.2 % (ref 11.5–15.5)
WBC: 6.5 10*3/uL (ref 4.0–10.5)

## 2015-11-17 LAB — BASIC METABOLIC PANEL
BUN: 13 mg/dL (ref 6–23)
CALCIUM: 9.8 mg/dL (ref 8.4–10.5)
CHLORIDE: 105 meq/L (ref 96–112)
CO2: 22 meq/L (ref 19–32)
Creatinine, Ser: 0.75 mg/dL (ref 0.40–1.20)
GFR: 109.37 mL/min (ref >60.00)
Glucose, Bld: 83 mg/dL (ref 70–99)
Potassium: 3.8 mEq/L (ref 3.5–5.1)
SODIUM: 137 meq/L (ref 135–145)

## 2015-11-17 LAB — HEMOGLOBIN A1C: Hgb A1c MFr Bld: 5.7 % (ref 4.6–6.5)

## 2015-11-17 NOTE — Progress Notes (Signed)
Pre visit review using our clinic review tool, if applicable. No additional management support is needed unless otherwise documented below in the visit note. 

## 2015-11-17 NOTE — Patient Instructions (Signed)
BEFORE YOU LEAVE: -labs -follow up in 3-4 months  We recommend the following healthy lifestyle measures: - eat a healthy whole foods diet consisting of regular small meals composed of vegetables, fruits, beans, nuts, seeds, healthy meats such as white chicken and fish and whole grains.  - avoid sweets, white starchy foods, fried foods, fast food, processed foods, sodas, red meet and other fattening foods.  - get a least 150-300 minutes of aerobic exercise per week.   We have ordered labs or studies at this visit. It can take up to 1-2 weeks for results and processing. IF results require follow up or explanation, we will call you with instructions. Clinically stable results will be released to your [facility]. If you have not heard from [place] or cannot find your results in [facility] in 2 weeks please contact our office at [phone].  If you are not yet signed up for [facility], please consider signing up.

## 2015-11-17 NOTE — Progress Notes (Addendum)
HPI:  Linda Conrad is a pleasant 40 yo here for follow up. She has struggled with a sinus infection nand some DUB lately, both evaluated elsewhere and now reports is doing much better. Her blood pressure is ok today, though she reports she did not take any of her medications yet. She restarted metformin in 2016. She is due for lab work.  ROS: See pertinent positives and negatives per HPI.  Past Medical History  Diagnosis Date  . Headache(784.0)     sinus  . Arthritis     hand  . GERD (gastroesophageal reflux disease)     no current med.  . 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 Foreign body of hand, right 07/2011    right MP  . Hypertension     denies HTN, but takes Hyzaar   . Diabetes mellitus without complication ([place])   . Abnormal Pap smear of cervix - hpv per report in 2014 with instructions to repeat in 1 year by Dr. Karlton Lemon 11/16/2013    normal pap with gyn 2015    Past Surgical History  Procedure Laterality Date  . Dorsal compartment release  08/26/2011    Procedure: RELEASE DORSAL COMPARTMENT (DEQUERVAIN);  Surgeon: Cammie Sickle., MD;  Location[facility];  Service: Orthopedics;  Laterality: Right;  . Foreign body removal  08/26/2011    Procedure: FOREIGN BODY REMOVAL ADULT;  Surgeon: Cammie Sickle., MD;  Location: [place];  Service: Orthopedics;  Laterality: Right;    Family History  Problem Relation Age of Onset  . Hyperlipidemia Mother   . Hypertension Mother   . Hypertension Father   . Stroke Father   . Breast cancer Paternal Grandmother     Age 69's  . Diabetes Mellitus II Paternal Grandmother   . Ovarian cancer Paternal Aunt     Age 72/58   . Ovarian cancer Other     Great Maternal Aunt   . Stroke Paternal Grandfather   . Hypertension Sister   . Heart failure Maternal Grandfather     Social History   Social History  . Marital Status: Single    Spouse Name: N/A  . Number of Children: N/A   . Years of Education: N/A   Social History Main Topics  . Smoking status: Never Smoker   . Smokeless tobacco: Never Used  . Alcohol Use: 0.0 oz/week    0 Standard drinks or equivalent per week     Comment: occasionally  . Drug Use: No  . Sexual Activity: No   Other Topics Concern  . None   Social History Narrative   Work or School: works with senior center      Home Situation: lives alone      Spiritual Beliefs: none, Christian      Lifestyle: active at work but not otherwise; avoiding carbs              Current outpatient prescriptions:  .  acetaminophen (TYLENOL) 650 MG CR tablet, Take 650 mg by mouth every 8 (eight) hours as needed for pain., Disp: , Rfl:  .  Aloe-Sodium Chloride (AYR SALINE NASAL GEL NA), Place into the nose., Disp: , Rfl:  .  Cholecalciferol (VITAMIN D3) 400 UNITS CAPS, Take by mouth daily., Disp: , Rfl:  .  FOLIC ACID PO, Take A999333 mg by mouth daily., Disp: , Rfl:  .  IBUPROFEN PO, Take by mouth., Disp: , Rfl:  .  losartan-hydrochlorothiazide (HYZAAR) 50-12.5 MG tablet, TAKE 1 TABLET BY MOUTH DAILY EVERY MORNING, Disp: 90 tablet, Rfl: 3 .  metFORMIN (GLUCOPHAGE) 1000 MG tablet, TAKE 1 TABLET BY MOUTH EVERY MORNING AND IN THE EVENING, Disp: 60 tablet, Rfl: 0 .  mometasone (ELOCON) 0.1 % cream, Apply 1 application topically daily., Disp: 45 g, Rfl: 1 .  ORTHO MICRONOR 0.35 MG tablet, Take 1 tablet (0.35 mg total) by mouth daily., Disp: 1 Package, Rfl: 11 .  triamcinolone cream (KENALOG) 0.1 %, , Disp: , Rfl:  .  vitamin B-12 (CYANOCOBALAMIN) 1000 MCG tablet, Take 1,000 mcg by mouth daily., Disp: , Rfl:   EXAM:  Filed Vitals:   11/17/15 1320  BP: 124/88  Pulse: 98  Temp: 98.2 F (36.8 C)    Body mass index is 33.85 kg/(m^2).  GENERAL: vitals reviewed and listed above, alert, oriented, appears well hydrated and in no acute distress  HEENT: atraumatic, conjunttiva clear, no obvious abnormalities on inspection of external nose and ears  NECK:  no obvious masses on inspection  LUNGS: clear to auscultation bilaterally, no wheezes, rales or rhonchi, good air movement  CV: HRRR, no peripheral edema  MS: moves all extremities without noticeable abnormality  PSYCH: pleasant and cooperative, no obvious depression or anxiety  FOOT EXAM: done, normal  ASSESSMENT AND PLAN:  Discussed the following assessment and plan:  Type 2 diabetes mellitus without complication, without long-term current use of insulin (HCC) - Plan: Hemoglobin A1c  Body mass index (BMI) of 33.0-33.9 in adult  Essential hypertension - Plan: Basic metabolic panel, CBC (no diff)  -foot exam done -labs today -lifestyle recommendations -continue current treatment pending labs -Patient advised to return or notify a doctor immediately if symptoms worsen or persist or new concerns arise.  Patient Instructions  BEFORE YOU LEAVE: -labs -follow up in 3-4 months  We recommend the following healthy lifestyle measures: - eat a healthy whole foods diet consisting of regular small meals composed of vegetables, fruits, beans, nuts, seeds, healthy meats such as white chicken and fish and whole grains.  - avoid sweets, white starchy foods, fried foods, fast food, processed foods, sodas, red meet and other fattening foods.  - get a least 150-300 minutes of aerobic exercise per week.   We have ordered labs or studies at this visit. It can take up to 1-2 weeks for results and processing. IF results require follow up or explanation, we will call you with instructions. Clinically stable results will be released to your [facility]. If you have not heard from [place] or cannot find your results in [facility] in 2 weeks please contact our office at [phone].  If you are not yet signed up for [facility], please consider signing up.            Colin Benton R.

## 2015-11-25 ENCOUNTER — Telehealth: Payer: Self-pay | Admitting: *Deleted

## 2015-11-25 NOTE — Telephone Encounter (Signed)
Left patient a message in regards to 08 recall- Patient needs appointment for PAP only -eh

## 2015-11-25 NOTE — Telephone Encounter (Signed)
-----   Message from Kem Boroughs, [place] sent at 11/24/2015  4:56 PM EDT ----- Regarding: RE: 08 recall Needs a pap since not done 07/2015.  She was abnormal 2014 with  Normal pap and positive HR HPV. Since then only 1 pap in 2015 has been done. ----- Message -----    From: Nicholes Rough, CMA    Sent: 11/24/2015   1:19 PM      To: Megan Salon, MD, Kem Boroughs, FNP Subject: 08 recall                                      Hi Patti I just want to verify this 08 recall for 05/2015. Patient was seen for her AEX in 05/2015 but no PAP was done. Should she be an 08 recall?  Thanks Margaretha Sheffield

## 2015-12-05 NOTE — Telephone Encounter (Signed)
Spoke with patient and set her up for a PAP only visit-eh

## 2015-12-09 ENCOUNTER — Encounter: Payer: Self-pay | Admitting: Nurse Practitioner

## 2015-12-09 ENCOUNTER — Ambulatory Visit (INDEPENDENT_AMBULATORY_CARE_PROVIDER_SITE_OTHER): Payer: 59 | Admitting: Nurse Practitioner

## 2015-12-09 VITALS — BP 110/74 | HR 84 | Ht 67.0 in | Wt 210.0 lb

## 2015-12-09 DIAGNOSIS — Z8742 Personal history of other diseases of the female genital tract: Secondary | ICD-10-CM

## 2015-12-09 NOTE — Progress Notes (Signed)
Reviewed personally.  M. Suzanne Tabias Swayze, MD.  

## 2015-12-09 NOTE — Progress Notes (Signed)
40 y.o. Single African American female G0P0 here for follow up pap smear.  She had normal pap  with positive HR HPV 05/2012.  In 2015 had a normal pap and negative HR HPV.  She did not have AEX in 2016.  In 08/2015 had AEX but my error no pap was done thinking she had completed 2 yrs of normal pap.  She returns now for a pap only to complete 2 yrs of normal pap following the positive HR HPV in 2014.  She is perimenopausal and on POP - Micronor secondary to HTN.  LMP  11/21/15.  Not SA since 2014.   She is very appreciative of our care.    O: Healthy WD,WN female Affect: normal Pelvic exam:CERVIX: no lesions or discharge pap is completed.  A: History of abnormal pap 2014  Normal pap 05/21/14  2016 - no AEX   P:  Discussed that we will call her with pap results   Labs : pap only   RV

## 2015-12-09 NOTE — Patient Instructions (Signed)
Will call with pap smear

## 2015-12-11 LAB — IPS PAP TEST WITH HPV

## 2015-12-20 ENCOUNTER — Other Ambulatory Visit: Payer: Self-pay | Admitting: Family Medicine

## 2016-01-18 ENCOUNTER — Other Ambulatory Visit: Payer: Self-pay | Admitting: Family Medicine

## 2016-01-29 ENCOUNTER — Ambulatory Visit: Payer: 59 | Admitting: Family Medicine

## 2016-02-19 ENCOUNTER — Ambulatory Visit: Payer: 59 | Admitting: Family Medicine

## 2016-02-25 NOTE — Progress Notes (Signed)
HPI:  Follow up:  Diabetes mellitus: -meds: metformin 100 bid, arb -no known complications -denies: hypoglycemia, polyuria, polydipsia, vision changes -has eye exam coming up  HTN/Obesity: -meds: losartan-hctz -denies: CP, SOB, HA  GERD: -meds: prilosec -stbale  ROS: See pertinent positives and negatives per HPI.  Past Medical History:  Diagnosis Date  . Abnormal Pap smear of cervix - hpv per report in 2014, seeing gyn 11/16/2013   normal pap with gyn 2015  . Arthritis    hand  . Diabetes mellitus without complication ([place])   . Foreign body of hand, right 07/2011   right MP  . GERD (gastroesophageal reflux disease)   . Headache(784.0)    sinus  . Hypertension    denies HTN, but takes Hyzaar   . Stenosing tenosynovitis of wrist 07/2011   right    Past Surgical History:  Procedure Laterality Date  . DORSAL COMPARTMENT RELEASE  08/26/2011   Procedure: RELEASE DORSAL COMPARTMENT (DEQUERVAIN);  Surgeon: Cammie Sickle., MD;  Location: [facility];  Service: Orthopedics;  Laterality: Right;  . FOREIGN BODY REMOVAL  08/26/2011   Procedure: FOREIGN BODY REMOVAL ADULT;  Surgeon: Cammie Sickle., MD;  Location: [place];  Service: Orthopedics;  Laterality: Right;    Family History  Problem Relation Age of Onset  . Hyperlipidemia Mother   . Hypertension Mother   . Hypertension Father   . Stroke Father   . Breast cancer Paternal Grandmother     Age 36's  . Diabetes Mellitus II Paternal Grandmother   . Ovarian cancer Paternal Aunt     Age 50/58   . Ovarian cancer Other     Great Maternal Aunt   . Stroke Paternal Grandfather   . Hypertension Sister   . Heart failure Maternal Grandfather     Social History   Social History  . Marital status: Single    Spouse name: N/A  . Number of children: N/A  . Years of education: N/A   Social History Main Topics  . Smoking status: Never Smoker  . Smokeless tobacco: Never Used  .  Alcohol use 0.0 oz/week     Comment: occasionally  . Drug use: No  . Sexual activity: No   Other Topics Concern  . None   Social History Narrative   Work or School: works with senior center      Home Situation: lives alone      Spiritual Beliefs: none, Christian      Lifestyle: active at work but not otherwise; avoiding carbs              Current Outpatient Prescriptions:  .  acetaminophen (TYLENOL) 650 MG CR tablet, Take 650 mg by mouth every 8 (eight) hours as needed for pain., Disp: , Rfl:  .  Aloe-Sodium Chloride (AYR SALINE NASAL GEL NA), Place into the nose., Disp: , Rfl:  .  Cholecalciferol (VITAMIN D3) 400 UNITS CAPS, Take by mouth daily., Disp: , Rfl:  .  fluticasone (FLONASE) 50 MCG/ACT nasal spray, Place into both nostrils daily., Disp: , Rfl:  .  FOLIC ACID PO, Take [id number] mg by mouth daily., Disp: , Rfl:  .  IBUPROFEN PO, Take by mouth., Disp: , Rfl:  .  losartan-hydrochlorothiazide (HYZAAR) 50-12.5 MG tablet, TAKE 1 TABLET BY MOUTH DAILY EVERY MORNING, Disp: 90 tablet, Rfl: 3 .  metFORMIN (GLUCOPHAGE) 1000 MG tablet, TAKE 1 TABLET BY MOUTH EVERY MORNING AND IN THE EVENING. NEED APPOINTMENT FOR MORE REFILLS,  Disp: 180 tablet, Rfl: 3 .  mometasone (ELOCON) 0.1 % cream, Apply 1 application topically daily., Disp: 45 g, Rfl: 1 .  omeprazole (PRILOSEC) 20 MG capsule, TAKE 1 CAPSULE (20 MG TOTAL) BY MOUTH DAILY., Disp: 90 capsule, Rfl: 0 .  ORTHO MICRONOR 0.35 MG tablet, Take 1 tablet (0.35 mg total) by mouth daily., Disp: 1 Package, Rfl: 11 .  triamcinolone cream (KENALOG) 0.1 %, , Disp: , Rfl:  .  vitamin B-12 (CYANOCOBALAMIN) 1000 MCG tablet, Take 1,000 mcg by mouth daily., Disp: , Rfl:   EXAM:  Vitals:   02/26/16 0932  BP: 128/88  Pulse: 87  Temp: 98.5 F (36.9 C)    Body mass index is 33.8 kg/m.  GENERAL: vitals reviewed and listed above, alert, oriented, appears well hydrated and in no acute distress  HEENT: atraumatic, conjunttiva clear, no obvious  abnormalities on inspection of external nose and ears  NECK: no obvious masses on inspection  LUNGS: clear to auscultation bilaterally, no wheezes, rales or rhonchi, good air movement  CV: HRRR, no peripheral edema  MS: moves all extremities without noticeable abnormality  PSYCH: pleasant and cooperative, no obvious depression or anxiety  ASSESSMENT AND PLAN:  Discussed the following assessment and plan:  Essential hypertension  Gastroesophageal reflux disease, esophagitis presence not specified  Type 2 diabetes mellitus without complication, without long-term current use of insulin (HCC)  BMI 33.0-33.9,adult  -lifestyle recs -flu shot offered -plan preventive visit in 3 months with labs then -Patient advised to return or notify a doctor immediately if symptoms worsen or persist or new concerns arise.  Patient Instructions  BEFORE YOU LEAVE: -follow up: physical and labs the same day in about 3-4 months; fasting if convenient for patient -flu shot  Try to reduce your acid medication (prilosec) if possible and use only as needed.  We recommend the following healthy lifestyle for LIFE: 1) Small portions.   Tip: eat off of a salad plate instead of a dinner plate.  Tip: It is ok to feel hungry after a meal - that likely means you ate an appropriate portion.  Tip: if you need more or a snack choose fruits, veggies and/or a handful of nuts or seeds.  2) Eat a healthy clean diet.  * Tip: Avoid (less then 1 serving per week): processed foods, sweets, sweetened drinks, white starches (rice, flour, bread, potatoes, pasta, etc), red meat, fast foods, butter  *Tip: CHOOSE instead   * 5-9 servings per day of fresh or frozen fruits and vegetables (but not corn, potatoes, bananas, canned or dried fruit)   *nuts and seeds, beans   *olives and olive oil   *small portions of lean meats such as fish and white chicken    *small portions of whole grains  3)Get at least 150 minutes of  sweaty aerobic exercise per week.  4)Reduce stress - consider counseling, meditation and relaxation to balance other aspects of your life.     Colin Benton R., DO

## 2016-02-26 ENCOUNTER — Encounter: Payer: Self-pay | Admitting: Family Medicine

## 2016-02-26 ENCOUNTER — Ambulatory Visit (INDEPENDENT_AMBULATORY_CARE_PROVIDER_SITE_OTHER): Payer: 59 | Admitting: Family Medicine

## 2016-02-26 VITALS — BP 128/88 | HR 87 | Temp 98.5°F | Ht 67.0 in | Wt 215.8 lb

## 2016-02-26 DIAGNOSIS — K219 Gastro-esophageal reflux disease without esophagitis: Secondary | ICD-10-CM

## 2016-02-26 DIAGNOSIS — I10 Essential (primary) hypertension: Secondary | ICD-10-CM

## 2016-02-26 DIAGNOSIS — Z23 Encounter for immunization: Secondary | ICD-10-CM

## 2016-02-26 DIAGNOSIS — E119 Type 2 diabetes mellitus without complications: Secondary | ICD-10-CM | POA: Diagnosis not present

## 2016-02-26 NOTE — Progress Notes (Signed)
Pre visit review using our clinic review tool, if applicable. No additional management support is needed unless otherwise documented below in the visit note. 

## 2016-02-26 NOTE — Patient Instructions (Signed)
BEFORE YOU LEAVE: -follow up: physical and labs the same day in about 3-4 months; fasting if convenient for patient -flu shot  Try to reduce your acid medication (prilosec) if possible and use only as needed.  We recommend the following healthy lifestyle for LIFE: 1) Small portions.   Tip: eat off of a salad plate instead of a dinner plate.  Tip: It is ok to feel hungry after a meal - that likely means you ate an appropriate portion.  Tip: if you need more or a snack choose fruits, veggies and/or a handful of nuts or seeds.  2) Eat a healthy clean diet.  * Tip: Avoid (less then 1 serving per week): processed foods, sweets, sweetened drinks, white starches (rice, flour, bread, potatoes, pasta, etc), red meat, fast foods, butter  *Tip: CHOOSE instead   * 5-9 servings per day of fresh or frozen fruits and vegetables (but not corn, potatoes, bananas, canned or dried fruit)   *nuts and seeds, beans   *olives and olive oil   *small portions of lean meats such as fish and white chicken    *small portions of whole grains  3)Get at least 150 minutes of sweaty aerobic exercise per week.  4)Reduce stress - consider counseling, meditation and relaxation to balance other aspects of your life.

## 2016-05-05 NOTE — Progress Notes (Signed)
HPI:  Here for CPE: Due for labs. Reports did eye exam  -Concerns and/or follow up today:   Diabetes mellitus: -meds: metformin 100 bid, arb -no known complications -wants to eventually stop medications  HTN/Obesity: -meds: losartan-hctz  GERD: -meds: prilosec -stbale  -Diet: variety of foods, balance and well rounded, larger portion sizes - sometimes eats the wrong things when she eats out  Exercise: 30 minutes, 3 days per week  -Taking folic acid, vitamin D or calcium: Takes vitamin D  -Diabetes and Dyslipidemia Screening: Fasting for labs today  -Vaccines: UTD  -pap history: sees Kem Boroughs in gyn  -sexual activity: yes, female partner, no new partners  -wants STI testing (Hep C if born 60-65): no  -FH breast, colon or ovarian ca: see FH Last mammogram: Does breast health with her gynecologist Last colon cancer screening: Not applicable   -Alcohol, Tobacco, drug use: see social history  Review of Systems - no fevers, unintentional weight loss, vision loss, hearing loss, chest pain, sob, hemoptysis, melena, hematochezia, hematuria, genital discharge, changing or concerning skin lesions, bleeding, bruising, loc, thoughts of self harm or SI  Past Medical History:  Diagnosis Date  . Abnormal Pap smear of cervix - hpv per report in 2014, seeing gyn 11/16/2013   normal pap with gyn 2015  . Arthritis    hand  . Diabetes mellitus without complication ([place])   . Foreign body of hand, right 07/2011   right MP  . GERD (gastroesophageal reflux disease)   . Headache(784.0)    sinus  . Hypertension    denies HTN, but takes Hyzaar   . Stenosing tenosynovitis of wrist 07/2011   right    Past Surgical History:  Procedure Laterality Date  . DORSAL COMPARTMENT RELEASE  08/26/2011   Procedure: RELEASE DORSAL COMPARTMENT (DEQUERVAIN);  Surgeon: Cammie Sickle., MD;  Location: [facility];  Service: Orthopedics;  Laterality: Right;  . FOREIGN BODY  REMOVAL  08/26/2011   Procedure: FOREIGN BODY REMOVAL ADULT;  Surgeon: Cammie Sickle., MD;  Location: [place];  Service: Orthopedics;  Laterality: Right;    Family History  Problem Relation Age of Onset  . Hyperlipidemia Mother   . Hypertension Mother   . Hypertension Father   . Stroke Father   . Breast cancer Paternal Grandmother     Age 17's  . Diabetes Mellitus II Paternal Grandmother   . Ovarian cancer Paternal Aunt     Age 39/58   . Ovarian cancer Other     Great Maternal Aunt   . Stroke Paternal Grandfather   . Hypertension Sister   . Heart failure Maternal Grandfather     Social History   Social History  . Marital status: Single    Spouse name: N/A  . Number of children: N/A  . Years of education: N/A   Social History Main Topics  . Smoking status: Never Smoker  . Smokeless tobacco: Never Used  . Alcohol use 0.0 oz/week     Comment: occasionally  . Drug use: No  . Sexual activity: No   Other Topics Concern  . None   Social History Narrative   Work or School: works with senior center      Home Situation: lives alone      Spiritual Beliefs: none, Christian      Lifestyle: active at work but not otherwise; avoiding carbs              Current Outpatient Prescriptions:  .  acetaminophen (TYLENOL) 650 MG CR tablet, Take 650 mg by mouth every 8 (eight) hours as needed for pain., Disp: , Rfl:  .  Aloe-Sodium Chloride (AYR SALINE NASAL GEL NA), Place into the nose., Disp: , Rfl:  .  Cholecalciferol (VITAMIN D3) 400 UNITS CAPS, Take by mouth daily., Disp: , Rfl:  .  fluticasone (FLONASE) 50 MCG/ACT nasal spray, Place into both nostrils daily., Disp: , Rfl:  .  FOLIC ACID PO, Take [id number] mg by mouth daily., Disp: , Rfl:  .  IBUPROFEN PO, Take by mouth., Disp: , Rfl:  .  losartan-hydrochlorothiazide (HYZAAR) 50-12.5 MG tablet, TAKE 1 TABLET BY MOUTH DAILY EVERY MORNING, Disp: 90 tablet, Rfl: 3 .  metFORMIN (GLUCOPHAGE) 1000 MG tablet, TAKE  1 TABLET BY MOUTH EVERY MORNING AND IN THE EVENING. NEED APPOINTMENT FOR MORE REFILLS, Disp: 180 tablet, Rfl: 3 .  mometasone (ELOCON) 0.1 % cream, Apply 1 application topically daily., Disp: 45 g, Rfl: 1 .  omeprazole (PRILOSEC) 20 MG capsule, TAKE 1 CAPSULE (20 MG TOTAL) BY MOUTH DAILY., Disp: 90 capsule, Rfl: 0 .  ORTHO MICRONOR 0.35 MG tablet, Take 1 tablet (0.35 mg total) by mouth daily., Disp: 1 Package, Rfl: 11 .  triamcinolone cream (KENALOG) 0.1 %, , Disp: , Rfl:  .  vitamin B-12 (CYANOCOBALAMIN) 1000 MCG tablet, Take 1,000 mcg by mouth daily., Disp: , Rfl:   EXAM:  Vitals:   05/06/16 0908  BP: 126/88  Pulse: 90  Temp: 98.3 F (36.8 C)   Body mass index is 35.35 kg/m.   GENERAL: vitals reviewed and listed below, alert, oriented, appears well hydrated and in no acute distress  HEENT: head atraumatic, PERRLA, normal appearance of eyes, ears, nose and mouth. moist mucus membranes.  NECK: supple, no masses or lymphadenopathy  LUNGS: clear to auscultation bilaterally, no rales, rhonchi or wheeze  CV: HRRR, no peripheral edema or cyanosis, normal pedal pulses  ABDOMEN: bowel sounds normal, soft, non tender to palpation, no masses, no rebound or guarding  SKIN: no rash or abnormal lesions  BREAST/GU: Declined, does with GYN  MS: normal gait, moves all extremities normally  NEURO: normal gait, speech and thought processing grossly intact, muscle tone grossly intact throughout  PSYCH: normal affect, pleasant and cooperative  ASSESSMENT AND PLAN:  Discussed the following assessment and plan:  Encounter for preventive health examination  Type 2 diabetes mellitus without complication, without long-term current use of insulin (HCC) - Plan: Lipid Panel, Hemoglobin A1C  Essential hypertension - Plan: Basic metabolic panel, CBC (no diff)  -Reports she will do her gynecological physical and breast exam with her gynecologist in a month  -Reports eye exam is done, asked  assistant to obtain and abstract -Discussed and advised all [place] preventive services health task force level A and B recommendations for age, sex and risks.  -Advised at least 150 minutes of exercise per week and a healthy diet  -labs, studies and vaccines per orders this encounter  Orders Placed This Encounter  Procedures  . Lipid Panel  . Hemoglobin A1C  . Basic metabolic panel  . CBC (no diff)    Patient advised to return to clinic immediately if symptoms worsen or persist or new concerns.  Patient Instructions  BEFORE YOU LEAVE: -labs -obtain and abstract eye exam -follow up: 3 months  We have ordered labs or studies at this visit. It can take up to 1-2 weeks for results and processing. IF results require follow up or explanation, we will call you  with instructions. Clinically stable results will be released to your [facility]. If you have not heard from [place] or cannot find your results in [facility] in 2 weeks please contact our office at [phone].  If you are not yet signed up for [facility], please consider signing up.   We recommend the following healthy lifestyle for LIFE: 1) Small portions.   Tip: eat off of a salad plate instead of a dinner plate.  Tip: It is ok to feel hungry after a meal - that likely means you ate an appropriate portion.  Tip: if you need more or a snack choose fruits, veggies and/or a handful of nuts or seeds.  2) Eat a healthy clean diet.  * Tip: Avoid (less then 1 serving per week): processed foods, sweets, sweetened drinks, white starches (rice, flour, bread, potatoes, pasta, etc), red meat, fast foods, butter  *Tip: CHOOSE instead   * 5-9 servings per day of fresh or frozen fruits and vegetables (but not corn, potatoes, bananas, canned or dried fruit)   *nuts and seeds, beans   *olives and olive oil   *small portions of lean meats such as fish and white chicken    *small portions of whole grains  3)Get at least 150 minutes of sweaty aerobic exercise  per week.  4)Reduce stress - consider counseling, meditation and relaxation to balance other aspects of your life.         No Follow-up on file.  Colin Benton R., DO

## 2016-05-06 ENCOUNTER — Ambulatory Visit (INDEPENDENT_AMBULATORY_CARE_PROVIDER_SITE_OTHER): Payer: 59 | Admitting: Family Medicine

## 2016-05-06 ENCOUNTER — Encounter: Payer: Self-pay | Admitting: Family Medicine

## 2016-05-06 VITALS — BP 126/88 | HR 90 | Temp 98.3°F | Ht 66.0 in | Wt 219.0 lb

## 2016-05-06 DIAGNOSIS — E119 Type 2 diabetes mellitus without complications: Secondary | ICD-10-CM

## 2016-05-06 DIAGNOSIS — Z0000 Encounter for general adult medical examination without abnormal findings: Secondary | ICD-10-CM

## 2016-05-06 DIAGNOSIS — I10 Essential (primary) hypertension: Secondary | ICD-10-CM | POA: Diagnosis not present

## 2016-05-06 LAB — BASIC METABOLIC PANEL
BUN: 11 mg/dL (ref 6–23)
CALCIUM: 9.2 mg/dL (ref 8.4–10.5)
CO2: 28 mEq/L (ref 19–32)
CREATININE: 0.79 mg/dL (ref 0.40–1.20)
Chloride: 104 mEq/L (ref 96–112)
GFR: 102.77 mL/min (ref >60.00)
GLUCOSE: 99 mg/dL (ref 70–99)
Potassium: 4.2 mEq/L (ref 3.5–5.1)
SODIUM: 138 meq/L (ref 135–145)

## 2016-05-06 LAB — CBC
HCT: 37.3 % (ref 36.0–46.0)
Hemoglobin: 12.3 g/dL (ref 12.0–15.0)
MCHC: 33.1 g/dL (ref 30.0–36.0)
MCV: 83.2 fl (ref 78.0–100.0)
PLATELETS: 304 10*3/uL (ref 150.0–400.0)
RBC: 4.48 Mil/uL (ref 3.87–5.11)
RDW: 14.1 % (ref 11.5–15.5)
WBC: 5.1 10*3/uL (ref 4.0–10.5)

## 2016-05-06 LAB — HEMOGLOBIN A1C: Hgb A1c MFr Bld: 6.2 % (ref 4.6–6.5)

## 2016-05-06 LAB — LIPID PANEL
CHOL/HDL RATIO: 3
Cholesterol: 161 mg/dL (ref 0–200)
HDL: 55.5 mg/dL (ref >39.00)
LDL CALC: 93 mg/dL (ref 0–99)
NonHDL: 105.32
TRIGLYCERIDES: 64 mg/dL (ref 0.0–149.0)
VLDL: 12.8 mg/dL (ref 0.0–40.0)

## 2016-05-06 NOTE — Progress Notes (Signed)
Pre visit review using our clinic review tool, if applicable. No additional management support is needed unless otherwise documented below in the visit note. 

## 2016-05-06 NOTE — Patient Instructions (Signed)
BEFORE YOU LEAVE: -labs -obtain and abstract eye exam -follow up: 3 months  We have ordered labs or studies at this visit. It can take up to 1-2 weeks for results and processing. IF results require follow up or explanation, we will call you with instructions. Clinically stable results will be released to your [facility]. If you have not heard from [place] or cannot find your results in [facility] in 2 weeks please contact our office at [phone].  If you are not yet signed up for [facility], please consider signing up.   We recommend the following healthy lifestyle for LIFE: 1) Small portions.   Tip: eat off of a salad plate instead of a dinner plate.  Tip: It is ok to feel hungry after a meal - that likely means you ate an appropriate portion.  Tip: if you need more or a snack choose fruits, veggies and/or a handful of nuts or seeds.  2) Eat a healthy clean diet.  * Tip: Avoid (less then 1 serving per week): processed foods, sweets, sweetened drinks, white starches (rice, flour, bread, potatoes, pasta, etc), red meat, fast foods, butter  *Tip: CHOOSE instead   * 5-9 servings per day of fresh or frozen fruits and vegetables (but not corn, potatoes, bananas, canned or dried fruit)   *nuts and seeds, beans   *olives and olive oil   *small portions of lean meats such as fish and white chicken    *small portions of whole grains  3)Get at least 150 minutes of sweaty aerobic exercise per week.  4)Reduce stress - consider counseling, meditation and relaxation to balance other aspects of your life.

## 2016-05-07 ENCOUNTER — Encounter: Payer: Self-pay | Admitting: *Deleted

## 2016-06-14 ENCOUNTER — Other Ambulatory Visit: Payer: Self-pay | Admitting: Family Medicine

## 2016-06-22 DIAGNOSIS — J343 Hypertrophy of nasal turbinates: Secondary | ICD-10-CM | POA: Diagnosis not present

## 2016-06-22 DIAGNOSIS — H6983 Other specified disorders of Eustachian tube, bilateral: Secondary | ICD-10-CM | POA: Diagnosis not present

## 2016-06-22 DIAGNOSIS — J302 Other seasonal allergic rhinitis: Secondary | ICD-10-CM | POA: Diagnosis not present

## 2016-06-26 ENCOUNTER — Other Ambulatory Visit: Payer: Self-pay | Admitting: Family Medicine

## 2016-07-07 ENCOUNTER — Other Ambulatory Visit: Payer: Self-pay | Admitting: Family Medicine

## 2016-07-07 DIAGNOSIS — Z1231 Encounter for screening mammogram for malignant neoplasm of breast: Secondary | ICD-10-CM

## 2016-08-03 ENCOUNTER — Ambulatory Visit
Admission: RE | Admit: 2016-08-03 | Discharge: 2016-08-03 | Disposition: A | Discharge status: Home / Self Care | Payer: 59 | Source: Ambulatory Visit | Attending: Family Medicine | Admitting: Family Medicine

## 2016-08-03 DIAGNOSIS — Z1231 Encounter for screening mammogram for malignant neoplasm of breast: Secondary | ICD-10-CM | POA: Diagnosis not present

## 2016-08-04 NOTE — Progress Notes (Signed)
HPI:  Linda Conrad is a pleasant 41 y.o. here for follow up. Chronic medical problems summarized below were reviewed for changes and stability and were updated as needed below. These issues and their treatment remain stable for the most part. Wt 219 last visit --> 218 today. She is trying eat healthy and is trying to get regular exercise. She has some arthritis in her hands and knees, sees an orthopedic specialist for this. Does okay as long as she stays away from steps. Wonders about treatment options for this. She wonders if it's okay to use Prilosec intermittently. She now only has acid reflux issues from time to time, then we'll take her Prilosec for a few days. Denies CP, SOB, DOE, treatment intolerance or new symptoms.  Diabetes mellitus: -meds: metformin 100 bid, arb -no known complications -wants to eventually stop medications  HTN/Obesity: -meds: losartan-hctz  GERD: -meds: prilosec as needed -stbale  ROS: See pertinent positives and negatives per HPI.  Past Medical History:  Diagnosis Date  . Abnormal Pap smear of cervix - hpv per report in 2014, seeing gyn 11/16/2013   normal pap with gyn 2015  . Arthritis    hand  . Diabetes mellitus without complication ([place])   . Foreign body of hand, right 07/2011   right MP  . GERD (gastroesophageal reflux disease)   . Headache(784.0)    sinus  . Hypertension    denies HTN, but takes Hyzaar   . Stenosing tenosynovitis of wrist 07/2011   right    Past Surgical History:  Procedure Laterality Date  . DORSAL COMPARTMENT RELEASE  08/26/2011   Procedure: RELEASE DORSAL COMPARTMENT (DEQUERVAIN);  Surgeon: Cammie Sickle., MD;  Location: [facility];  Service: Orthopedics;  Laterality: Right;  . FOREIGN BODY REMOVAL  08/26/2011   Procedure: FOREIGN BODY REMOVAL ADULT;  Surgeon: Cammie Sickle., MD;  Location: [facility];  Service: Orthopedics;  Laterality: Right;    Family History  Problem  Relation Age of Onset  . Hyperlipidemia Mother   . Hypertension Mother   . Hypertension Father   . Stroke Father   . Breast cancer Paternal Grandmother     Age 61's  . Diabetes Mellitus II Paternal Grandmother   . Ovarian cancer Paternal Aunt     Age 26/58   . Ovarian cancer Other     Great Maternal Aunt   . Stroke Paternal Grandfather   . Hypertension Sister   . Heart failure Maternal Grandfather     Social History   Social History  . Marital status: Single    Spouse name: N/A  . Number of children: N/A  . Years of education: N/A   Social History Main Topics  . Smoking status: Never Smoker  . Smokeless tobacco: Never Used  . Alcohol use 0.0 oz/week     Comment: occasionally  . Drug use: No  . Sexual activity: No   Other Topics Concern  . None   Social History Narrative   Work or School: works with senior center      Home Situation: lives alone      Spiritual Beliefs: none, Christian      Lifestyle: active at work but not otherwise; avoiding carbs              Current Outpatient Prescriptions:  .  acetaminophen (TYLENOL) 650 MG CR tablet, Take 650 mg by mouth every 8 (eight) hours as needed for pain., Disp: , Rfl:  .  Aloe-Sodium Chloride (AYR SALINE NASAL GEL NA), Place into the nose., Disp: , Rfl:  .  Cholecalciferol (VITAMIN D3) 400 UNITS CAPS, Take by mouth daily., Disp: , Rfl:  .  fluticasone (FLONASE) 50 MCG/ACT nasal spray, Place into both nostrils daily., Disp: , Rfl:  .  FOLIC ACID PO, Take 621 mg by mouth daily., Disp: , Rfl:  .  IBUPROFEN PO, Take by mouth., Disp: , Rfl:  .  losartan-hydrochlorothiazide (HYZAAR) 50-12.5 MG tablet, Take 1.5 tablets by mouth daily., Disp: 135 tablet, Rfl: 3 .  metFORMIN (GLUCOPHAGE) 1000 MG tablet, TAKE 1 TABLET BY MOUTH EVERY MORNING AND IN THE EVENING. NEED APPOINTMENT FOR MORE REFILLS, Disp: 180 tablet, Rfl: 3 .  mometasone (ELOCON) 0.1 % cream, Apply 1 application topically daily., Disp: 45 g, Rfl: 1 .   omeprazole (PRILOSEC) 20 MG capsule, TAKE ONE CAPSULE BY MOUTH DAILY, Disp: 90 capsule, Rfl: 1 .  ORTHO MICRONOR 0.35 MG tablet, Take 1 tablet (0.35 mg total) by mouth daily., Disp: 1 Package, Rfl: 11 .  triamcinolone cream (KENALOG) 0.1 %, , Disp: , Rfl:  .  vitamin B-12 (CYANOCOBALAMIN) 1000 MCG tablet, Take 1,000 mcg by mouth daily., Disp: , Rfl:   EXAM:  Vitals:   08/05/16 0911  BP: 120/90  Pulse: 85  Temp: 98.5 F (36.9 C)    Body mass index is 35.28 kg/m.  GENERAL: vitals reviewed and listed above, alert, oriented, appears well hydrated and in no acute distress  HEENT: atraumatic, conjunttiva clear, no obvious abnormalities on inspection of external nose and ears  NECK: no obvious masses on inspection  LUNGS: clear to auscultation bilaterally, no wheezes, rales or rhonchi, good air movement  CV: HRRR, no peripheral edema  MS: moves all extremities without noticeable abnormality  PSYCH: pleasant and cooperative, no obvious depression or anxiety  ASSESSMENT AND PLAN:  Discussed the following assessment and plan:  Essential hypertension  Type 2 diabetes mellitus without complication, without long-term current use of insulin (HCC)  BMI 35.0-35.9,adult  Gastroesophageal reflux disease, esophagitis presence not specified  Osteoarthritis of multiple joints, unspecified osteoarthritis type  -increase antihypertensive -lifestyle recs -Regular exercise, anti-inflammatory diet, weight reduction, ice and topical sports creams as needed for the arthritic pain along with follow-up with her orthopedic specialist -Patient advised to return or notify a doctor immediately if symptoms worsen or persist or new concerns arise.  Patient Instructions  BEFORE YOU LEAVE: -follow up: 3 months  Weight today is 218 lbs.  Please increase your blood pressure medicine to 1-1/2 tablets daily.  In sure you are getting regular aerobic exercise. Eat a healthy anti-inflammatory diet with  lots of vegetables and herbs.  Consider tumeric in your diet.  Topical capsaicin sports creams (available over-the-counter) and ice as needed for the arthritic pain.   We recommend the following healthy lifestyle for LIFE: 1) Small portions.   Tip: eat off of a salad plate instead of a dinner plate.  Tip: It is ok to feel hungry after a meal of proper portion size.  Tip: if you need more or a snack choose fruits, veggies and/or a handful of nuts or seeds.  2) Eat a healthy clean diet.  * Tip: Avoid (less then 1 serving per week): processed foods, sweets, sweetened drinks, white starches (rice, flour, bread, potatoes, pasta, etc), red meat, fast foods, butter  *Tip: CHOOSE instead   * 5-9 servings per day of fresh or frozen fruits and vegetables (but not corn, potatoes, bananas, canned or dried fruit)   *  nuts and seeds, beans   *olives and olive oil   *small portions of lean meats such as fish and white chicken    *small portions of whole grains  3)Get at least 150 minutes of sweaty aerobic exercise per week.  4)Reduce stress - consider counseling, meditation and relaxation to balance other aspects of your life.  WE NOW OFFER   [facility] FAST TRACK!!!  SAME DAY Appointments for ACUTE CARE  Such as: Sprains, Injuries, cuts, abrasions, rashes, muscle pain, joint pain, back pain Colds, flu, sore throats, headache, allergies, cough, fever  Ear pain, sinus and eye infections Abdominal pain, nausea, vomiting, diarrhea, upset stomach Animal/insect bites  3 Easy Ways to Schedule: Walk-In Scheduling Call in scheduling Mychart Sign-up: [email or web address]           Colin Benton R., DO

## 2016-08-05 ENCOUNTER — Encounter: Payer: Self-pay | Admitting: Family Medicine

## 2016-08-05 ENCOUNTER — Ambulatory Visit (INDEPENDENT_AMBULATORY_CARE_PROVIDER_SITE_OTHER): Payer: 59 | Admitting: Family Medicine

## 2016-08-05 VITALS — BP 120/90 | HR 85 | Temp 98.5°F | Ht 66.0 in | Wt 218.6 lb

## 2016-08-05 DIAGNOSIS — Z6835 Body mass index (BMI) 35.0-35.9, adult: Secondary | ICD-10-CM

## 2016-08-05 DIAGNOSIS — I10 Essential (primary) hypertension: Secondary | ICD-10-CM | POA: Diagnosis not present

## 2016-08-05 DIAGNOSIS — K219 Gastro-esophageal reflux disease without esophagitis: Secondary | ICD-10-CM | POA: Diagnosis not present

## 2016-08-05 DIAGNOSIS — E119 Type 2 diabetes mellitus without complications: Secondary | ICD-10-CM

## 2016-08-05 DIAGNOSIS — M159 Polyosteoarthritis, unspecified: Secondary | ICD-10-CM | POA: Diagnosis not present

## 2016-08-05 MED ORDER — LOSARTAN POTASSIUM-HCTZ 50-12.5 MG PO TABS: 1.5000 | ORAL_TABLET | Freq: Every day | ORAL | 3 refills | Status: DC

## 2016-08-05 NOTE — Patient Instructions (Addendum)
BEFORE YOU LEAVE: -follow up: 3 months  Weight today is 218 lbs.  Please increase your blood pressure medicine to 1-1/2 tablets daily.  In sure you are getting regular aerobic exercise. Eat a healthy anti-inflammatory diet with lots of vegetables and herbs.  Consider tumeric in your diet.  Topical capsaicin sports creams (available over-the-counter) and ice as needed for the arthritic pain.   We recommend the following healthy lifestyle for LIFE: 1) Small portions.   Tip: eat off of a salad plate instead of a dinner plate.  Tip: It is ok to feel hungry after a meal of proper portion size.  Tip: if you need more or a snack choose fruits, veggies and/or a handful of nuts or seeds.  2) Eat a healthy clean diet.  * Tip: Avoid (less then 1 serving per week): processed foods, sweets, sweetened drinks, white starches (rice, flour, bread, potatoes, pasta, etc), red meat, fast foods, butter  *Tip: CHOOSE instead   * 5-9 servings per day of fresh or frozen fruits and vegetables (but not corn, potatoes, bananas, canned or dried fruit)   *nuts and seeds, beans   *olives and olive oil   *small portions of lean meats such as fish and white chicken    *small portions of whole grains  3)Get at least 150 minutes of sweaty aerobic exercise per week.  4)Reduce stress - consider counseling, meditation and relaxation to balance other aspects of your life.  WE NOW OFFER   [facility] FAST TRACK!!!  SAME DAY Appointments for ACUTE CARE  Such as: Sprains, Injuries, cuts, abrasions, rashes, muscle pain, joint pain, back pain Colds, flu, sore throats, headache, allergies, cough, fever  Ear pain, sinus and eye infections Abdominal pain, nausea, vomiting, diarrhea, upset stomach Animal/insect bites  3 Easy Ways to Schedule: Walk-In Scheduling Call in scheduling Mychart Sign-up: [email or web address]

## 2016-08-05 NOTE — Progress Notes (Signed)
Pre visit review using our clinic review tool, if applicable. No additional management support is needed unless otherwise documented below in the visit note. 

## 2016-08-11 ENCOUNTER — Encounter: Payer: Self-pay | Admitting: Nurse Practitioner

## 2016-08-11 NOTE — Progress Notes (Signed)
Patient ID: Linda Conrad, female   DOB: Oct 07, 1975, 41 y.o.   MRN: [id number]  41 y.o. [id number] Single African American Fe here for annual exam. menses for 5 days, moderate to light.  No cramps, some PMS. Not dating or SA.  She is going to [place] with friends from church.  She is very excited.  Patient's last menstrual period was 08/03/2016 (exact date).          Sexually active: No.  The current method of family planning is oral progesterone-only contraceptive.    Exercising: Yes.    Home exercise routine includes walking about 2 miles daily. Smoker:  no  Health Maintenance: Pap: 12/09/15, Negative with neg HR HPV  05/21/14, Negative with neg HR HPV  06/01/12, Negative with pos HR HPV MMG: 08/03/16, 3D, Bi-Rads 1:  Negative TDaP: 03/19/13 Pneumonia: 01/02/14 Pneumovax HIV: 05/21/14 Labs: Vit D today   reports that she has never smoked. She has never used smokeless tobacco. She reports that she drinks alcohol. She reports that she does not use drugs.  Past Medical History:  Diagnosis Date  . Abnormal Pap smear of cervix - hpv per report in 2014, seeing gyn 11/16/2013   normal pap with gyn 2015  . Arthritis    hand  . Diabetes mellitus without complication ([place])   . Foreign body of hand, right 07/2011   right MP  . GERD (gastroesophageal reflux disease)   . Headache(784.0)    sinus  . Hypertension    denies HTN, but takes Hyzaar   . Stenosing tenosynovitis of wrist 07/2011   right    Past Surgical History:  Procedure Laterality Date  . DORSAL COMPARTMENT RELEASE  08/26/2011   Procedure: RELEASE DORSAL COMPARTMENT (DEQUERVAIN);  Surgeon: Cammie Sickle., MD;  Location: [facility];  Service: Orthopedics;  Laterality: Right;  . FOREIGN BODY REMOVAL  08/26/2011   Procedure: FOREIGN BODY REMOVAL ADULT;  Surgeon: Cammie Sickle., MD;  Location: [facility];  Service: Orthopedics;  Laterality: Right;    Current Outpatient Prescriptions  Medication  Sig Dispense Refill  . acetaminophen (TYLENOL) 650 MG CR tablet Take 650 mg by mouth every 8 (eight) hours as needed for pain.    . Aloe-Sodium Chloride (AYR SALINE NASAL GEL NA) Place into the nose.    . Cholecalciferol (VITAMIN D3) 400 UNITS CAPS Take by mouth daily.    . fluticasone (FLONASE) 50 MCG/ACT nasal spray Place into both nostrils daily.    Marland Kitchen FOLIC ACID PO Take 983 mg by mouth daily.    . IBUPROFEN PO Take by mouth.    . losartan-hydrochlorothiazide (HYZAAR) 50-12.5 MG tablet Take 1.5 tablets by mouth daily. 135 tablet 3  . metFORMIN (GLUCOPHAGE) 1000 MG tablet TAKE 1 TABLET BY MOUTH EVERY MORNING AND IN THE EVENING. NEED APPOINTMENT FOR MORE REFILLS 180 tablet 3  . mometasone (ELOCON) 0.1 % cream Apply 1 application topically daily. 45 g 1  . omeprazole (PRILOSEC) 20 MG capsule TAKE ONE CAPSULE BY MOUTH DAILY 90 capsule 1  . ORTHO MICRONOR 0.35 MG tablet Take 1 tablet (0.35 mg total) by mouth daily. 1 Package 11  . triamcinolone cream (KENALOG) 0.1 %     . vitamin B-12 (CYANOCOBALAMIN) 1000 MCG tablet Take 1,000 mcg by mouth daily.     No current facility-administered medications for this visit.     Family History  Problem Relation Age of Onset  . Hyperlipidemia Mother   . Hypertension Mother   .  Hypertension Father   . Stroke Father   . Breast cancer Paternal Grandmother     Age 42's  . Diabetes Mellitus II Paternal Grandmother   . Stroke Paternal Grandfather   . Hypertension Sister   . Heart failure Maternal Grandfather   . Ovarian cancer Paternal Aunt     Age 39/58   . Ovarian cancer Other     Great Maternal Aunt     ROS:  Pertinent items are noted in HPI.  Otherwise, a comprehensive ROS was negative.  Exam:   BP 120/84 (BP Location: Right Arm, Patient Position: Sitting, Cuff Size: Large)   Pulse 64   Ht 5\' 6"  (1.676 m)   Wt 215 lb (97.5 kg)   LMP 08/03/2016 (Exact Date)   BMI 34.70 kg/m  Height: 5\' 6"  (167.6 cm) Ht Readings from Last 3 Encounters:   08/13/16 5\' 6"  (1.676 m)  08/05/16 5\' 6"  (1.676 m)  05/06/16 5\' 6"  (1.676 m)    General appearance: alert, cooperative and appears stated age Head: Normocephalic, without obvious abnormality, atraumatic Neck: no adenopathy, supple, symmetrical, trachea midline and thyroid normal to inspection and palpation Lungs: clear to auscultation bilaterally Breasts: normal appearance, no masses or tenderness Heart: regular rate and rhythm Abdomen: soft, non-tender; no masses,  no organomegaly Extremities: extremities normal, atraumatic, no cyanosis or edema Skin: Skin color, texture, turgor normal. No rashes or lesions Lymph nodes: Cervical, supraclavicular, and axillary nodes normal. No abnormal inguinal nodes palpated Neurologic: Grossly normal   Pelvic: External genitalia:  no lesions              Urethra:  normal appearing urethra with no masses, tenderness or lesions              Bartholin's and Skene's: normal                 Vagina: normal appearing vagina with normal color and discharge, no lesions              Cervix: anteverted              Pap taken: No. Bimanual Exam:  Uterus:  normal size, contour, position, consistency, mobility, non-tender              Adnexa: no mass, fullness, tenderness               Rectovaginal: Confirms               Anus:  normal sphincter tone, no lesions  Chaperone present: no  A:  Well Woman with normal exam     History of regular menses on POP History of HTN and DM  History of normal pap and + HR HPV in 2014 - normal since  Fatigue and joint pain  P:   Reviewed health and wellness pertinent to exam  Pap smear not done  Mammogram is due 07/2017  Refill on POP for a year  Follow with labs  Counseled on breast self exam, mammography screening, use and side effects of POP's, adequate intake of calcium and vitamin D, diet and exercise, Kegel's exercises return annually or prn  An After Visit Summary was printed and given  to the patient.

## 2016-08-13 ENCOUNTER — Encounter: Payer: Self-pay | Admitting: Nurse Practitioner

## 2016-08-13 ENCOUNTER — Ambulatory Visit (INDEPENDENT_AMBULATORY_CARE_PROVIDER_SITE_OTHER): Payer: 59 | Admitting: Nurse Practitioner

## 2016-08-13 VITALS — BP 120/84 | HR 64 | Ht 66.0 in | Wt 215.0 lb

## 2016-08-13 DIAGNOSIS — Z0000 Encounter for general adult medical examination without abnormal findings: Secondary | ICD-10-CM | POA: Diagnosis not present

## 2016-08-13 DIAGNOSIS — Z87898 Personal history of other specified conditions: Secondary | ICD-10-CM

## 2016-08-13 DIAGNOSIS — Z8742 Personal history of other diseases of the female genital tract: Secondary | ICD-10-CM

## 2016-08-13 DIAGNOSIS — R5383 Other fatigue: Secondary | ICD-10-CM | POA: Diagnosis not present

## 2016-08-13 DIAGNOSIS — E559 Vitamin D deficiency, unspecified: Secondary | ICD-10-CM

## 2016-08-13 DIAGNOSIS — Z01419 Encounter for gynecological examination (general) (routine) without abnormal findings: Secondary | ICD-10-CM

## 2016-08-13 LAB — COMPREHENSIVE METABOLIC PANEL
ALBUMIN: 3.9 g/dL (ref 3.6–5.1)
ALK PHOS: 90 U/L (ref 33–115)
ALT: 12 U/L (ref 6–29)
AST: 16 U/L (ref 10–30)
BUN: 11 mg/dL (ref 7–25)
CALCIUM: 8.9 mg/dL (ref 8.6–10.2)
CO2: 24 mmol/L (ref 20–31)
Chloride: 102 mmol/L (ref 98–110)
Creat: 0.79 mg/dL (ref 0.50–1.10)
GLUCOSE: 145 mg/dL — AB (ref 65–99)
POTASSIUM: 3.6 mmol/L (ref 3.5–5.3)
Sodium: 138 mmol/L (ref 135–146)
Total Bilirubin: 0.5 mg/dL (ref 0.2–1.2)
Total Protein: 7.1 g/dL (ref 6.1–8.1)

## 2016-08-13 MED ORDER — ORTHO MICRONOR 0.35 MG PO TABS: 1.0000 | ORAL_TABLET | Freq: Every day | ORAL | 4 refills | Status: DC

## 2016-08-13 NOTE — Patient Instructions (Signed)

## 2016-08-14 LAB — TSH: TSH: 0.73 mIU/L

## 2016-08-14 LAB — VITAMIN D 25 HYDROXY (VIT D DEFICIENCY, FRACTURES): Vit D, 25-Hydroxy: 28 ng/mL — ABNORMAL LOW (ref 30–100)

## 2016-08-14 LAB — VITAMIN B12: VITAMIN B 12: 1916 pg/mL — AB (ref 200–1100)

## 2016-08-15 NOTE — Progress Notes (Signed)
Encounter reviewed by Dr. Brook Amundson C. Silva.  

## 2016-08-17 DIAGNOSIS — H6503 Acute serous otitis media, bilateral: Secondary | ICD-10-CM | POA: Diagnosis not present

## 2016-08-17 DIAGNOSIS — H9313 Tinnitus, bilateral: Secondary | ICD-10-CM | POA: Diagnosis not present

## 2016-08-17 DIAGNOSIS — H6983 Other specified disorders of Eustachian tube, bilateral: Secondary | ICD-10-CM | POA: Diagnosis not present

## 2016-08-23 ENCOUNTER — Other Ambulatory Visit: Payer: Self-pay

## 2016-09-23 DIAGNOSIS — M25561 Pain in right knee: Secondary | ICD-10-CM | POA: Diagnosis not present

## 2016-09-23 DIAGNOSIS — M25562 Pain in left knee: Secondary | ICD-10-CM | POA: Diagnosis not present

## 2016-10-20 ENCOUNTER — Other Ambulatory Visit: Payer: Self-pay | Admitting: Nurse Practitioner

## 2016-10-26 ENCOUNTER — Other Ambulatory Visit: Payer: Self-pay | Admitting: Nurse Practitioner

## 2016-11-02 DIAGNOSIS — J301 Allergic rhinitis due to pollen: Secondary | ICD-10-CM | POA: Diagnosis not present

## 2016-11-02 DIAGNOSIS — J3081 Allergic rhinitis due to animal (cat) (dog) hair and dander: Secondary | ICD-10-CM | POA: Diagnosis not present

## 2016-11-02 DIAGNOSIS — J3089 Other allergic rhinitis: Secondary | ICD-10-CM | POA: Diagnosis not present

## 2016-11-03 NOTE — Progress Notes (Deleted)
HPI:  Linda Conrad is a pleasant 41 y.o. here for follow up. Chronic medical problems summarized below were reviewed for changes and stability and were updated as needed below. These issues and their treatment remain stable for the most part. Wt 218 last visit -->. Denies CP, SOB, DOE, treatment intolerance or new symptoms. Due for hgba1c,   Diabetes mellitus: -meds: metformin 100 bid, arb -no known complications -wants to eventually stop medications  HTN/Obesity: -meds: losartan-hctz  GERD: -meds: prilosec as needed  ROS: See pertinent positives and negatives per HPI.  Past Medical History:  Diagnosis Date  . Abnormal Pap smear of cervix - hpv per report in 2014, seeing gyn 11/16/2013   normal pap with gyn 2015  . Arthritis    hand  . Diabetes mellitus without complication ([place])   . Foreign body of hand, right 07/2011   right MP  . GERD (gastroesophageal reflux disease)   . Headache(784.0)    sinus  . Hypertension    denies HTN, but takes Hyzaar   . Stenosing tenosynovitis of wrist 07/2011   right    Past Surgical History:  Procedure Laterality Date  . DORSAL COMPARTMENT RELEASE  08/26/2011   Procedure: RELEASE DORSAL COMPARTMENT (DEQUERVAIN);  Surgeon: Cammie Sickle., MD;  Location: [facility];  Service: Orthopedics;  Laterality: Right;  . FOREIGN BODY REMOVAL  08/26/2011   Procedure: FOREIGN BODY REMOVAL ADULT;  Surgeon: Cammie Sickle., MD;  Location: [facility];  Service: Orthopedics;  Laterality: Right;    Family History  Problem Relation Age of Onset  . Hyperlipidemia Mother   . Hypertension Mother   . Hypertension Father   . Stroke Father   . Breast cancer Paternal Grandmother        Age 36's  . Diabetes Mellitus II Paternal Grandmother   . Stroke Paternal Grandfather   . Hypertension Sister   . Heart failure Maternal Grandfather   . Ovarian cancer Paternal Aunt        Age 18/58   . Ovarian cancer Other      Great Maternal Aunt     Social History   Social History  . Marital status: Single    Spouse name: N/A  . Number of children: N/A  . Years of education: N/A   Social History Main Topics  . Smoking status: Never Smoker  . Smokeless tobacco: Never Used  . Alcohol use 0.0 oz/week     Comment: occasionally  . Drug use: No  . Sexual activity: No   Other Topics Concern  . Not on file   Social History Narrative   Work or School: works with senior center      Home Situation: lives alone      Spiritual Beliefs: none, Christian      Lifestyle: active at work but not otherwise; avoiding carbs              Current Outpatient Prescriptions:  .  acetaminophen (TYLENOL) 650 MG CR tablet, Take 650 mg by mouth every 8 (eight) hours as needed for pain., Disp: , Rfl:  .  Aloe-Sodium Chloride (AYR SALINE NASAL GEL NA), Place into the nose., Disp: , Rfl:  .  Cholecalciferol (VITAMIN D3) 400 UNITS CAPS, Take by mouth daily., Disp: , Rfl:  .  fluticasone (FLONASE) 50 MCG/ACT nasal spray, Place into both nostrils daily., Disp: , Rfl:  .  FOLIC ACID PO, Take 161 mg by mouth daily., Disp: , Rfl:  .  IBUPROFEN PO, Take by mouth., Disp: , Rfl:  .  losartan-hydrochlorothiazide (HYZAAR) 50-12.5 MG tablet, Take 1.5 tablets by mouth daily., Disp: 135 tablet, Rfl: 3 .  metFORMIN (GLUCOPHAGE) 1000 MG tablet, TAKE 1 TABLET BY MOUTH EVERY MORNING AND IN THE EVENING. NEED APPOINTMENT FOR MORE REFILLS, Disp: 180 tablet, Rfl: 3 .  Misc Natural Products (TURMERIC CURCUMIN) CAPS, Take 1 capsule by mouth every other day., Disp: 30 capsule, Rfl: 0 .  mometasone (ELOCON) 0.1 % cream, Apply 1 application topically daily., Disp: 45 g, Rfl: 1 .  omeprazole (PRILOSEC) 20 MG capsule, TAKE ONE CAPSULE BY MOUTH DAILY, Disp: 90 capsule, Rfl: 1 .  ORTHO MICRONOR 0.35 MG tablet, Take 1 tablet (0.35 mg total) by mouth daily., Disp: 3 Package, Rfl: 4 .  triamcinolone cream (KENALOG) 0.1 %, , Disp: , Rfl:  .  vitamin B-12  (CYANOCOBALAMIN) 1000 MCG tablet, Take 1,000 mcg by mouth daily., Disp: , Rfl:   EXAM:  There were no vitals filed for this visit.  There is no height or weight on file to calculate BMI.  GENERAL: vitals reviewed and listed above, alert, oriented, appears well hydrated and in no acute distress  HEENT: atraumatic, conjunttiva clear, no obvious abnormalities on inspection of external nose and ears  NECK: no obvious masses on inspection  LUNGS: clear to auscultation bilaterally, no wheezes, rales or rhonchi, good air movement  CV: HRRR, no peripheral edema  MS: moves all extremities without noticeable abnormality  PSYCH: pleasant and cooperative, no obvious depression or anxiety  ASSESSMENT AND PLAN:  Discussed the following assessment and plan:  No diagnosis found.  -Patient advised to return or notify a doctor immediately if symptoms worsen or persist or new concerns arise.  There are no Patient Instructions on file for this visit.  Colin Benton R., DO

## 2016-11-04 ENCOUNTER — Ambulatory Visit: Payer: 59 | Admitting: Family Medicine

## 2016-11-04 DIAGNOSIS — Z0289 Encounter for other administrative examinations: Secondary | ICD-10-CM

## 2016-12-28 ENCOUNTER — Telehealth: Payer: Self-pay | Admitting: Obstetrics & Gynecology

## 2016-12-28 NOTE — Telephone Encounter (Signed)
Left patient a message to call back to reschedule a future appointment that was cancelled by the provider for [facility]. °

## 2016-12-31 ENCOUNTER — Ambulatory Visit (INDEPENDENT_AMBULATORY_CARE_PROVIDER_SITE_OTHER): Payer: 59 | Admitting: Family Medicine

## 2016-12-31 ENCOUNTER — Encounter: Payer: Self-pay | Admitting: Family Medicine

## 2016-12-31 VITALS — BP 118/80 | HR 85 | Temp 99.0°F | Ht 66.0 in | Wt 216.3 lb

## 2016-12-31 DIAGNOSIS — I10 Essential (primary) hypertension: Secondary | ICD-10-CM | POA: Diagnosis not present

## 2016-12-31 DIAGNOSIS — E1159 Type 2 diabetes mellitus with other circulatory complications: Secondary | ICD-10-CM

## 2016-12-31 DIAGNOSIS — L989 Disorder of the skin and subcutaneous tissue, unspecified: Secondary | ICD-10-CM | POA: Diagnosis not present

## 2016-12-31 DIAGNOSIS — D649 Anemia, unspecified: Secondary | ICD-10-CM | POA: Diagnosis not present

## 2016-12-31 DIAGNOSIS — R600 Localized edema: Secondary | ICD-10-CM

## 2016-12-31 DIAGNOSIS — I152 Hypertension secondary to endocrine disorders: Secondary | ICD-10-CM

## 2016-12-31 DIAGNOSIS — E119 Type 2 diabetes mellitus without complications: Secondary | ICD-10-CM

## 2016-12-31 LAB — CBC
HCT: 35.9 % (ref 35.0–45.0)
Hemoglobin: 11.4 g/dL — ABNORMAL LOW (ref 11.7–15.5)
MCH: 27.4 pg (ref 27.0–33.0)
MCHC: 31.8 g/dL — ABNORMAL LOW (ref 32.0–36.0)
MCV: 86.3 fL (ref 80.0–100.0)
MPV: 9.2 fL (ref 7.5–12.5)
PLATELETS: 350 10*3/uL (ref 140–400)
RBC: 4.16 MIL/uL (ref 3.80–5.10)
RDW: 13.9 % (ref 11.0–15.0)
WBC: 5.2 10*3/uL (ref 3.8–10.8)

## 2016-12-31 NOTE — Patient Instructions (Signed)
BEFORE YOU LEAVE: -Labs -follow up: 3 months  Please let us know if the lesion on your leg persists. You can use some topical hydrocortisone cream, over-the-counter, as needed for itching.  For swelling in the legs, advise a healthy low sodium and low sugar diet, regular exercise, compression socks and elevation of swelling occurs. Please follow up if this becomes a regular issue is worsening.  We have ordered labs or studies at this visit. It can take up to 1-2 weeks for results and processing. IF results require follow up or explanation, we will call you with instructions. Clinically stable results will be released to your [facility]. If you have not heard from [place] or cannot find your results in [facility] in 2 weeks please contact our office at [phone].  If you are not yet signed up for [facility], please consider signing up.   We recommend the following healthy lifestyle for LIFE: 1) Small portions.   Tip: eat off of a salad plate instead of a dinner plate.  Tip: It is ok to feel hungry after a meal - that likely means you ate an appropriate portion.  Tip: if you need more or a snack choose fruits, veggies and/or a handful of nuts or seeds.  2) Eat a healthy clean diet.   TRY TO EAT: -at least 5-7 servings of low sugar vegetables per day (not corn, potatoes or bananas.) -berries are the best choice if you wish to eat fruit.   -lean meets (fish, chicken or [place] breasts) -vegan proteins for some meals - beans or tofu, whole grains, nuts and seeds -Replace bad fats with good fats - good fats include: fish, nuts and seeds, canola oil, olive oil -small amounts of low fat or non fat dairy -small amounts of100 % whole grains - check the lables  AVOID: -SUGAR, sweets, anything with added sugar, corn syrup or sweeteners -if you must have a sweetener, small amounts of stevia may be best -sweetened beverages -simple starches (rice, bread, potatoes, pasta, chips, etc - small amounts of 100%  whole grains are ok) -red meat, pork, butter -fried foods, fast food, processed food, excessive dairy, eggs and coconut.  3)Get at least 150 minutes of sweaty aerobic exercise per week.  4)Reduce stress - consider counseling, meditation and relaxation to balance other aspects of your life.  WE NOW OFFER   [facility] FAST TRACK!!!  SAME DAY Appointments for ACUTE CARE  Such as: Sprains, Injuries, cuts, abrasions, rashes, muscle pain, joint pain, back pain Colds, flu, sore throats, headache, allergies, cough, fever  Ear pain, sinus and eye infections Abdominal pain, nausea, vomiting, diarrhea, upset stomach Animal/insect bites  3 Easy Ways to Schedule: Walk-In Scheduling Call in scheduling Mychart Sign-up: [email or web address]

## 2016-12-31 NOTE — Progress Notes (Signed)
HPI:  Acute visit for itchy patch of skin on R leg: -started last week when in [place] - now much better, was scratching it -L leg inner surface above the knee -no rash elsewhere, fevers, malaise or pain  LE edema: -had some swelling in both ankles and feet when was traveling in [place] last week, now resolved -no SOb, CP, palpitations, redness or pain in legs  Follow up DM, HTN, Obesity: -Trying to eat a healthy diet and stay active -Continues her medications without any complaints today -Is due for lab check  ROS: See pertinent positives and negatives per HPI.  Past Medical History:  Diagnosis Date  . Abnormal Pap smear of cervix - hpv per report in 2014, seeing gyn 11/16/2013   normal pap with gyn 2015  . Arthritis    hand  . Diabetes mellitus without complication ([place])   . Foreign body of hand, right 07/2011   right MP  . GERD (gastroesophageal reflux disease)   . Headache(784.0)    sinus  . Hypertension    denies HTN, but takes Hyzaar   . Stenosing tenosynovitis of wrist 07/2011   right    Past Surgical History:  Procedure Laterality Date  . DORSAL COMPARTMENT RELEASE  08/26/2011   Procedure: RELEASE DORSAL COMPARTMENT (DEQUERVAIN);  Surgeon: Cammie Sickle., MD;  Location: [facility];  Service: Orthopedics;  Laterality: Right;  . FOREIGN BODY REMOVAL  08/26/2011   Procedure: FOREIGN BODY REMOVAL ADULT;  Surgeon: Cammie Sickle., MD;  Location: [place];  Service: Orthopedics;  Laterality: Right;    Family History  Problem Relation Age of Onset  . Hyperlipidemia Mother   . Hypertension Mother   . Hypertension Father   . Stroke Father   . Breast cancer Paternal Grandmother        Age 39's  . Diabetes Mellitus II Paternal Grandmother   . Stroke Paternal Grandfather   . Hypertension Sister   . Heart failure Maternal Grandfather   . Ovarian cancer Paternal Aunt        Age 81/58   . Ovarian cancer Other        Great Maternal Aunt      Social History   Social History  . Marital status: Single    Spouse name: N/A  . Number of children: N/A  . Years of education: N/A   Social History Main Topics  . Smoking status: Never Smoker  . Smokeless tobacco: Never Used  . Alcohol use 0.0 oz/week     Comment: occasionally  . Drug use: No  . Sexual activity: No   Other Topics Concern  . None   Social History Narrative   Work or School: works with senior center      Home Situation: lives alone      Spiritual Beliefs: none, Christian      Lifestyle: active at work but not otherwise; avoiding carbs              Current Outpatient Prescriptions:  .  acetaminophen (TYLENOL) 650 MG CR tablet, Take 650 mg by mouth every 8 (eight) hours as needed for pain., Disp: , Rfl:  .  Aloe-Sodium Chloride (AYR SALINE NASAL GEL NA), Place into the nose., Disp: , Rfl:  .  Cholecalciferol (VITAMIN D3) 400 UNITS CAPS, Take by mouth daily., Disp: , Rfl:  .  fluticasone (FLONASE) 50 MCG/ACT nasal spray, Place into both nostrils daily., Disp: , Rfl:  .  FOLIC ACID  PO, Take 400 mg by mouth daily., Disp: , Rfl:  .  IBUPROFEN PO, Take by mouth., Disp: , Rfl:  .  losartan-hydrochlorothiazide (HYZAAR) 50-12.5 MG tablet, Take 1.5 tablets by mouth daily., Disp: 135 tablet, Rfl: 3 .  metFORMIN (GLUCOPHAGE) 1000 MG tablet, TAKE 1 TABLET BY MOUTH EVERY MORNING AND IN THE EVENING. NEED APPOINTMENT FOR MORE REFILLS, Disp: 180 tablet, Rfl: 3 .  Misc Natural Products (TURMERIC CURCUMIN) CAPS, Take 1 capsule by mouth every other day., Disp: 30 capsule, Rfl: 0 .  mometasone (ELOCON) 0.1 % cream, Apply 1 application topically daily., Disp: 45 g, Rfl: 1 .  omeprazole (PRILOSEC) 20 MG capsule, TAKE ONE CAPSULE BY MOUTH DAILY, Disp: 90 capsule, Rfl: 1 .  ORTHO MICRONOR 0.35 MG tablet, Take 1 tablet (0.35 mg total) by mouth daily., Disp: 3 Package, Rfl: 4 .  triamcinolone cream (KENALOG) 0.1 %, , Disp: , Rfl:  .  vitamin B-12 (CYANOCOBALAMIN) 1000 MCG  tablet, Take 1,000 mcg by mouth daily., Disp: , Rfl:   EXAM:  Vitals:   12/31/16 1509  BP: 118/80  Pulse: 85  Temp: 99 F (37.2 C)    Body mass index is 34.91 kg/m.  GENERAL: vitals reviewed and listed above, alert, oriented, appears well hydrated and in no acute distress  HEENT: atraumatic, conjunttiva clear, no obvious abnormalities on inspection of external nose and ears  NECK: no obvious masses on inspection  LUNGS: clear to auscultation bilaterally, no wheezes, rales or rhonchi, good air movement  CV: HRRR, no peripheral edema  MS: moves all extremities without noticeable abnormality  SKIN: Small area of hyperpigmented skin on the right inner thigh in a streaky pattern, no edema, induration, redness or swelling  PSYCH: pleasant and cooperative, no obvious depression or anxiety  ASSESSMENT AND PLAN:  Discussed the following assessment and plan:  Type 2 diabetes mellitus without complication, without long-term current use of insulin (HCC) - Plan: Hemoglobin A1c, CANCELED: Hemoglobin A1c  Hypertension associated with diabetes ([place]) - Plan: Basic metabolic panel, CBC (no diff), CANCELED: Basic metabolic panel, CANCELED: CBC  Skin lesion  Leg edema  -Findings on her skin looked more like posttraumatic hyperpigmentation from scratching - suspect she may have had an insect bite or local contact dermatitis that has healed - opted for observation and follow-up if persists -She has no swelling today, and advised a healthy diet, regular exercise, elevation and compression if needed -We'll do her labs today that she has not had regular labs for follow-up in some time -Patient advised to return or notify a doctor immediately if symptoms worsen or persist or new concerns arise.  Patient Instructions  BEFORE YOU LEAVE: -Labs -follow up: 3 months  Please let us know if the lesion on your leg persists. You can use some topical hydrocortisone cream, over-the-counter, as needed  for itching.  For swelling in the legs, advise a healthy low sodium and low sugar diet, regular exercise, compression socks and elevation of swelling occurs. Please follow up if this becomes a regular issue is worsening.  We have ordered labs or studies at this visit. It can take up to 1-2 weeks for results and processing. IF results require follow up or explanation, we will call you with instructions. Clinically stable results will be released to your [facility]. If you have not heard from [place] or cannot find your results in [facility] in 2 weeks please contact our office at [phone].  If you are not yet signed up for [facility], please consider  signing up.   We recommend the following healthy lifestyle for LIFE: 1) Small portions.   Tip: eat off of a salad plate instead of a dinner plate.  Tip: It is ok to feel hungry after a meal - that likely means you ate an appropriate portion.  Tip: if you need more or a snack choose fruits, veggies and/or a handful of nuts or seeds.  2) Eat a healthy clean diet.   TRY TO EAT: -at least 5-7 servings of low sugar vegetables per day (not corn, potatoes or bananas.) -berries are the best choice if you wish to eat fruit.   -lean meets (fish, chicken or [place] breasts) -vegan proteins for some meals - beans or tofu, whole grains, nuts and seeds -Replace bad fats with good fats - good fats include: fish, nuts and seeds, canola oil, olive oil -small amounts of low fat or non fat dairy -small amounts of100 % whole grains - check the lables  AVOID: -SUGAR, sweets, anything with added sugar, corn syrup or sweeteners -if you must have a sweetener, small amounts of stevia may be best -sweetened beverages -simple starches (rice, bread, potatoes, pasta, chips, etc - small amounts of 100% whole grains are ok) -red meat, pork, butter -fried foods, fast food, processed food, excessive dairy, eggs and coconut.  3)Get at least 150 minutes of sweaty aerobic exercise per  week.  4)Reduce stress - consider counseling, meditation and relaxation to balance other aspects of your life.  WE NOW OFFER   [facility] FAST TRACK!!!  SAME DAY Appointments for ACUTE CARE  Such as: Sprains, Injuries, cuts, abrasions, rashes, muscle pain, joint pain, back pain Colds, flu, sore throats, headache, allergies, cough, fever  Ear pain, sinus and eye infections Abdominal pain, nausea, vomiting, diarrhea, upset stomach Animal/insect bites  3 Easy Ways to Schedule: Walk-In Scheduling Call in scheduling Mychart Sign-up: [email or web address]                 Colin Benton R., DO

## 2017-01-01 LAB — BASIC METABOLIC PANEL
BUN: 10 mg/dL (ref 7–25)
CO2: 22 mmol/L (ref 20–31)
Calcium: 8.9 mg/dL (ref 8.6–10.2)
Chloride: 103 mmol/L (ref 98–110)
Creat: 0.86 mg/dL (ref 0.50–1.10)
Glucose, Bld: 144 mg/dL — ABNORMAL HIGH (ref 65–99)
Potassium: 3.7 mmol/L (ref 3.5–5.3)
SODIUM: 137 mmol/L (ref 135–146)

## 2017-01-01 LAB — HEMOGLOBIN A1C
HEMOGLOBIN A1C: 6 % — AB (ref <5.7)
MEAN PLASMA GLUCOSE: 126 mg/dL

## 2017-01-05 ENCOUNTER — Other Ambulatory Visit: Payer: Self-pay | Admitting: Family Medicine

## 2017-01-06 NOTE — Addendum Note (Signed)
Addended by: Lahoma Crocker A on: 01/06/2017 08:30 AM   Modules accepted: Orders

## 2017-02-07 ENCOUNTER — Other Ambulatory Visit (INDEPENDENT_AMBULATORY_CARE_PROVIDER_SITE_OTHER): Payer: 59

## 2017-02-07 DIAGNOSIS — D649 Anemia, unspecified: Secondary | ICD-10-CM | POA: Diagnosis not present

## 2017-02-07 LAB — CBC WITH DIFFERENTIAL/PLATELET
Basophils Absolute: 0 10*3/uL (ref 0.0–0.1)
Basophils Relative: 0.5 % (ref 0.0–3.0)
EOS PCT: 1.1 % (ref 0.0–5.0)
Eosinophils Absolute: 0.1 10*3/uL (ref 0.0–0.7)
HCT: 35.2 % — ABNORMAL LOW (ref 36.0–46.0)
Hemoglobin: 11.4 g/dL — ABNORMAL LOW (ref 12.0–15.0)
LYMPHS ABS: 1.7 10*3/uL (ref 0.7–4.0)
LYMPHS PCT: 35.6 % (ref 12.0–46.0)
MCHC: 32.5 g/dL (ref 30.0–36.0)
MCV: 85.5 fl (ref 78.0–100.0)
MONOS PCT: 6.9 % (ref 3.0–12.0)
Monocytes Absolute: 0.3 10*3/uL (ref 0.1–1.0)
NEUTROS ABS: 2.7 10*3/uL (ref 1.4–7.7)
NEUTROS PCT: 55.9 % (ref 43.0–77.0)
Platelets: 296 10*3/uL (ref 150.0–400.0)
RBC: 4.12 Mil/uL (ref 3.87–5.11)
RDW: 14.3 % (ref 11.5–15.5)
WBC: 4.8 10*3/uL (ref 4.0–10.5)

## 2017-02-14 ENCOUNTER — Telehealth: Payer: Self-pay | Admitting: Family Medicine

## 2017-02-14 NOTE — Telephone Encounter (Signed)
°  FYI   PT SAID SHE IS STARTING THE STOOL SAMPLE TODAY

## 2017-02-15 NOTE — Telephone Encounter (Signed)
Done

## 2017-02-18 ENCOUNTER — Other Ambulatory Visit (INDEPENDENT_AMBULATORY_CARE_PROVIDER_SITE_OTHER): Payer: 59

## 2017-02-18 DIAGNOSIS — D649 Anemia, unspecified: Secondary | ICD-10-CM

## 2017-02-18 LAB — POC HEMOCCULT BLD/STL (HOME/3-CARD/SCREEN)
FECAL OCCULT BLD: NEGATIVE
FECAL OCCULT BLD: NEGATIVE
Fecal Occult Blood, POC: NEGATIVE

## 2017-03-03 ENCOUNTER — Telehealth: Payer: Self-pay | Admitting: Certified Nurse Midwife

## 2017-03-03 NOTE — Telephone Encounter (Signed)
Returned call to patient, mailbox full, unable to leave message.

## 2017-03-03 NOTE — Telephone Encounter (Signed)
Patient had her hemoccult tested at her pcp office and it came back negative. Would like an appointment to follow up here for additional testing.

## 2017-03-10 ENCOUNTER — Ambulatory Visit: Payer: 59

## 2017-03-10 ENCOUNTER — Other Ambulatory Visit (INDEPENDENT_AMBULATORY_CARE_PROVIDER_SITE_OTHER): Payer: 59

## 2017-03-10 DIAGNOSIS — Z23 Encounter for immunization: Secondary | ICD-10-CM | POA: Diagnosis not present

## 2017-03-10 DIAGNOSIS — D649 Anemia, unspecified: Secondary | ICD-10-CM | POA: Diagnosis not present

## 2017-03-10 NOTE — Progress Notes (Signed)
Pt received her flu vaccine today.

## 2017-03-12 LAB — ANEMIA PANEL
FERRITIN: 26 ng/mL (ref 15–150)
Folate, Hemolysate: >620 ng/mL
HEMATOCRIT: 34.4 % (ref 34.0–46.6)
IRON: 63 ug/dL (ref 27–159)
Iron Saturation: 18 % (ref 15–55)
Retic Ct Pct: 2.4 % (ref 0.6–2.6)
TIBC: 352 ug/dL (ref 250–450)
UIBC: 289 ug/dL (ref 131–425)
Vitamin B-12: 903 pg/mL (ref 232–1245)

## 2017-03-16 NOTE — Telephone Encounter (Signed)
Spoke with patient. Patient states she was seen by PCP Dr. Maudie Mercury and recommended to f/u with GYN for evaluation of bleeding. Had recent labs for anemia and was advised "normal".  Reports menses as light with small clots, length of cycles vary This has been regular until July 2018. LMP 03/14/17.   POP for contraceptive.  Reports lower back pain for the last 3-4 months.   Denies SHOB, lightheadedness, weakness, vaginal odor/discharge, itching or urinary complaints.   Last seen for AEX 08/13/16 with Kem Boroughs, NP. Patient request to schedule with Dr. Quincy Simmonds. Scheduled for OV on 03/17/17 at 2:30pm with Dr. Quincy Simmonds. Patient is agreeable to date and time.   Routing to provider for final review. Patient is agreeable to disposition. Will close encounter.

## 2017-03-17 ENCOUNTER — Encounter: Payer: Self-pay | Admitting: Obstetrics and Gynecology

## 2017-03-17 ENCOUNTER — Ambulatory Visit (INDEPENDENT_AMBULATORY_CARE_PROVIDER_SITE_OTHER): Payer: 59 | Admitting: Obstetrics and Gynecology

## 2017-03-17 VITALS — BP 140/82 | HR 100 | Ht 66.0 in | Wt 212.6 lb

## 2017-03-17 DIAGNOSIS — Z308 Encounter for other contraceptive management: Secondary | ICD-10-CM

## 2017-03-17 DIAGNOSIS — D649 Anemia, unspecified: Secondary | ICD-10-CM

## 2017-03-17 NOTE — Progress Notes (Signed)
GYNECOLOGY  VISIT   HPI: 41 y.o.   Single  African American  female   [place] with Patient's last menstrual period was 03/04/2017 (exact date).   here for evaluation of anemia. Patient states Dr. Maudie Mercury wanted her to be evaluated. She states cycles are not heavy but blood is old dark blood.     Swelling in her lower extremities after returning from traveling is what prompted her evaluation which lead to dx of anemia.   Hgb 11.4 on 12/31/16.  Anemia panel normal on 03/10/17. Repeat CBC is due in November. Had hemoccult testing through her PCP which were negative x 3.   Now she is having cramping with her cycles and old blood the last couple of menses. No significant cramping.  Last menses lasted 5 days and had more bright red blood.  Prior menses lasted 10 days and very little blood noted.  Pad change twice a day due to flow.   Has been on POPs for 15 - 20 years.  Forgets 2 pills per month and then doubles up.  Does not have break through bleeding.  Not sexually active.  No known fibroids.  She denies any excessive bleeding with brushing teeth.  Does have some right flank pain since July 2018.  Diet - eats very little red meat.  Eats a lot of chicken and fish.  Eats fruits and vegetables.  Head of [place].   GYNECOLOGIC HISTORY: Patient's last menstrual period was 03/04/2017 (exact date). Contraception:  Micronor Menopausal hormone therapy:  n/a Last mammogram:  08/03/16, 3D, Bi-Rads 1:  Negative Last pap smear:  12/09/15, Negative with neg HR HPV             05/21/14, Negative with neg HR HPV             06/01/12, Negative with pos HR HPV        OB History    Gravida Para Term Preterm AB Living   0 0 0 0 0 0   SAB TAB Ectopic Multiple Live Births   0 0 0 0 0         Patient Active Problem List   Diagnosis Date Noted  . Hx of abnormal cervical Pap smear 08/01/2015  . Diabetes mellitus ([place]) 09/13/2014  . GERD (gastroesophageal reflux disease)  11/16/2013  . Hearing loss - followed by [facility] 11/16/2013    Past Medical History:  Diagnosis Date  . Abnormal Pap smear of cervix - hpv per report in 2014, seeing gyn 11/16/2013   normal pap with gyn 2015  . Arthritis    hand  . Diabetes mellitus without complication ([place])   . Foreign body of hand, right 07/2011   right MP  . GERD (gastroesophageal reflux disease)   . Headache(784.0)    sinus  . Hypertension    denies HTN, but takes Hyzaar   . Stenosing tenosynovitis of wrist 07/2011   right    Past Surgical History:  Procedure Laterality Date  . DORSAL COMPARTMENT RELEASE  08/26/2011   Procedure: RELEASE DORSAL COMPARTMENT (DEQUERVAIN);  Surgeon: Cammie Sickle., MD;  Location: [facility];  Service: Orthopedics;  Laterality: Right;  . FOREIGN BODY REMOVAL  08/26/2011   Procedure: FOREIGN BODY REMOVAL ADULT;  Surgeon: Cammie Sickle., MD;  Location: [place];  Service: Orthopedics;  Laterality: Right;    Current Outpatient Prescriptions  Medication Sig Dispense Refill  . acetaminophen (TYLENOL) 650 MG  CR tablet Take 650 mg by mouth every 8 (eight) hours as needed for pain.    . Aloe-Sodium Chloride (AYR SALINE NASAL GEL NA) Place into the nose.    . Cholecalciferol (VITAMIN D3) 400 UNITS CAPS Take by mouth daily.    . fluticasone (FLONASE) 50 MCG/ACT nasal spray Place into both nostrils daily.    Marland Kitchen FOLIC ACID PO Take 742 mg by mouth daily.    . IBUPROFEN PO Take by mouth.    . losartan-hydrochlorothiazide (HYZAAR) 50-12.5 MG tablet Take 1.5 tablets by mouth daily. 135 tablet 3  . metFORMIN (GLUCOPHAGE) 1000 MG tablet Take 1 tablet (1,000 mg total) by mouth 2 (two) times daily with a meal. 180 tablet 1  . mometasone (ELOCON) 0.1 % cream Apply 1 application topically daily. 45 g 1  . omeprazole (PRILOSEC) 20 MG capsule TAKE ONE CAPSULE BY MOUTH DAILY 90 capsule 1  . ORTHO MICRONOR 0.35 MG tablet Take 1 tablet (0.35 mg total) by  mouth daily. 3 Package 4  . triamcinolone cream (KENALOG) 0.1 %     . vitamin B-12 (CYANOCOBALAMIN) 1000 MCG tablet Take 1,000 mcg by mouth daily.    . Misc Natural Products (TURMERIC CURCUMIN) CAPS Take 1 capsule by mouth every other day. 30 capsule 0   No current facility-administered medications for this visit.      ALLERGIES: Oak bark [quercus robur] and Soap  Family History  Problem Relation Age of Onset  . Hyperlipidemia Mother   . Hypertension Mother   . Hypertension Father   . Stroke Father   . Breast cancer Paternal Grandmother        Age 70's  . Diabetes Mellitus II Paternal Grandmother   . Stroke Paternal Grandfather   . Hypertension Sister   . Heart failure Maternal Grandfather   . Ovarian cancer Paternal Aunt        Age 53/58   . Ovarian cancer Other        Great Maternal Aunt     Social History   Social History  . Marital status: Single    Spouse name: N/A  . Number of children: N/A  . Years of education: N/A   Occupational History  . Not on file.   Social History Main Topics  . Smoking status: Never Smoker  . Smokeless tobacco: Never Used  . Alcohol use 0.0 oz/week     Comment: occasionally  . Drug use: No  . Sexual activity: No     Comment: Micronor   Other Topics Concern  . Not on file   Social History Narrative   Work or School: works with senior center      Home Situation: lives alone      Spiritual Beliefs: none, Christian      Lifestyle: active at work but not otherwise; avoiding carbs             ROS:  Pertinent items are noted in HPI.  PHYSICAL EXAMINATION:    BP 140/82 (BP Location: Right Arm, Patient Position: Sitting, Cuff Size: Large)   Pulse 100   Ht 5\' 6"  (1.676 m)   Wt 212 lb 9.6 oz (96.4 kg)   LMP 03/04/2017 (Exact Date)   BMI 34.31 kg/m     General appearance: alert, cooperative and appears stated age   Abdomen: soft, non-tender, no masses,  no organomegaly   Pelvic: External genitalia:  no lesions               Urethra:  normal appearing urethra with no masses, tenderness or lesions              Bartholins and Skenes: normal                 Vagina: normal appearing vagina with normal color and discharge, no lesions              Cervix: no lesions                Bimanual Exam:  Uterus:  normal size, contour, position, consistency, mobility, non-tender              Adnexa: no mass, fullness, tenderness              Rectal exam: Yes.  .  Confirms.              Anus:  normal sphincter tone, no lesions  Chaperone was present for exam.  ASSESSMENT  Mild anemia of undetermined etiology.  I do not believe this related to patient's menstrual cycles.  Contraceptive management.   PLAN  We reviewed hemoglobinopathies and chronic disease as reasons for anemia as well.  I recommend follow up with Dr. Maudie Mercury for recheck of CBC. Hopefully, it will be back to normal so no further intervention is needed.  We discussed normal and abnormal menstrual cycles.  I am not recommending a pelvic US at this juncture as her menstrual bleeding is not heavy.  We reviewed contraceptive tx options of Micronor, Depo Provera, Nexplanon, and progesterone IUD.   She will continue with Micronor unless she becomes sexually active and continues to have difficulty remembering her pills.  Follow up for annual exam and prn.    An After Visit Summary was printed and given to the patient.  ___25___ minutes face to face time of which over 50% was spent in counseling.

## 2017-03-20 ENCOUNTER — Encounter (HOSPITAL_COMMUNITY): Payer: Self-pay

## 2017-03-20 ENCOUNTER — Emergency Department (HOSPITAL_COMMUNITY)
Admission: EM | Admit: 2017-03-20 | Discharge: 2017-03-20 | Disposition: A | Discharge status: Home / Self Care | Payer: 59 | Attending: Emergency Medicine | Admitting: Emergency Medicine

## 2017-03-20 DIAGNOSIS — I10 Essential (primary) hypertension: Secondary | ICD-10-CM | POA: Diagnosis not present

## 2017-03-20 DIAGNOSIS — Z79899 Other long term (current) drug therapy: Secondary | ICD-10-CM | POA: Insufficient documentation

## 2017-03-20 DIAGNOSIS — E119 Type 2 diabetes mellitus without complications: Secondary | ICD-10-CM | POA: Insufficient documentation

## 2017-03-20 DIAGNOSIS — M549 Dorsalgia, unspecified: Secondary | ICD-10-CM | POA: Diagnosis present

## 2017-03-20 DIAGNOSIS — M62830 Muscle spasm of back: Secondary | ICD-10-CM | POA: Diagnosis not present

## 2017-03-20 DIAGNOSIS — Z7984 Long term (current) use of oral hypoglycemic drugs: Secondary | ICD-10-CM | POA: Diagnosis not present

## 2017-03-20 MED ORDER — CYCLOBENZAPRINE HCL 10 MG PO TABS: 10.0000 mg | ORAL_TABLET | Freq: Two times a day (BID) | ORAL | 0 refills | Status: DC | PRN

## 2017-03-20 MED ORDER — DICLOFENAC SODIUM 50 MG PO TBEC: 50.0000 mg | DELAYED_RELEASE_TABLET | Freq: Two times a day (BID) | ORAL | 0 refills | Status: DC

## 2017-03-20 MED ORDER — PREDNISONE 20 MG PO TABS
60.0000 mg | ORAL_TABLET | Freq: Once | ORAL | Status: AC
  Administered 2017-03-20: 60 mg via ORAL
  Filled 2017-03-20: qty 3

## 2017-03-20 MED ORDER — CYCLOBENZAPRINE HCL 10 MG PO TABS
10.0000 mg | ORAL_TABLET | Freq: Once | ORAL | Status: AC
Start: 2017-03-20 — End: 2017-03-20
  Administered 2017-03-20: 10 mg via ORAL
  Filled 2017-03-20: qty 1

## 2017-03-20 NOTE — ED Notes (Signed)
Pt c/o lower right back pain that started on Friday and has gradually worsened. Denies loss of bowels.

## 2017-03-20 NOTE — Discharge Instructions (Signed)
Do not drive while taking the muscle relaxant as it will make you sleepy. Follow up with Dr. Berenice Primas as planned. Return here as needed.

## 2017-03-20 NOTE — ED Provider Notes (Signed)
[facility] DEPT Provider Note   CSN: [id number] Arrival date & time: 03/20/17  1918     History   Chief Complaint Chief Complaint  Patient presents with  . Back Pain    HPI Linda Conrad is a 41 y.o. female with hx of DM, HTN and GERD who presents to the ED with back pain. The pain started 3 days ago but has gotten worse. Patient took tylenol yesterday and today without relief. Patient states she is planning to go to Dr. Berenice Primas tomorrow (ortho) but the pain got to bad tonight.  The history is provided by the patient. No language interpreter was used.  Back Pain   The current episode started more than 2 days ago. The problem occurs constantly. The problem has been gradually worsening. The pain is associated with no known injury. The pain is present in the thoracic spine. The quality of the pain is described as stabbing and shooting. The pain radiates to the right thigh. The pain is at a severity of 10/10. The symptoms are aggravated by bending and twisting. The pain is the same all the time. Pertinent negatives include no chest pain, no fever, no weight loss, no headaches, no abdominal pain, no bowel incontinence, no bladder incontinence, no dysuria and no weakness. She has tried ice for the symptoms. The treatment provided no relief.    Past Medical History:  Diagnosis Date  . Abnormal Pap smear of cervix - hpv per report in 2014, seeing gyn 11/16/2013   normal pap with gyn 2015  . Arthritis    hand  . Diabetes mellitus without complication ([place])   . Foreign body of hand, right 07/2011   right MP  . GERD (gastroesophageal reflux disease)   . Headache(784.0)    sinus  . Hypertension    denies HTN, but takes Hyzaar   . Stenosing tenosynovitis of wrist 07/2011   right    Patient Active Problem List   Diagnosis Date Noted  . Hx of abnormal cervical Pap smear 08/01/2015  . Diabetes mellitus ([place]) 09/13/2014  . GERD (gastroesophageal reflux  disease) 11/16/2013  . Hearing loss - followed by [facility] 11/16/2013    Past Surgical History:  Procedure Laterality Date  . DORSAL COMPARTMENT RELEASE  08/26/2011   Procedure: RELEASE DORSAL COMPARTMENT (DEQUERVAIN);  Surgeon: Cammie Sickle., MD;  Location: [facility];  Service: Orthopedics;  Laterality: Right;  . FOREIGN BODY REMOVAL  08/26/2011   Procedure: FOREIGN BODY REMOVAL ADULT;  Surgeon: Cammie Sickle., MD;  Location: [facility];  Service: Orthopedics;  Laterality: Right;    OB History    Gravida Para Term Preterm AB Living   0 0 0 0 0 0   SAB TAB Ectopic Multiple Live Births   0 0 0 0 0       Home Medications    Prior to Admission medications   Medication Sig Start Date End Date Taking? Authorizing Provider  acetaminophen (TYLENOL) 650 MG CR tablet Take 650 mg by mouth every 8 (eight) hours as needed for pain.    [provider]  Aloe-Sodium Chloride (AYR SALINE NASAL GEL NA) Place into the nose.    [provider]  Cholecalciferol (VITAMIN D3) 400 UNITS CAPS Take by mouth daily.    [provider]  cyclobenzaprine (FLEXERIL) 10 MG tablet Take 1 tablet (10 mg total) by mouth 2 (two) times daily as needed for muscle spasms. 03/20/17  Debroah Baller M, NP  diclofenac (VOLTAREN) 50 MG EC tablet Take 1 tablet (50 mg total) by mouth 2 (two) times daily. 03/20/17   Ashley Murrain, NP  fluticasone (FLONASE) 50 MCG/ACT nasal spray Place into both nostrils daily.    [provider]  FOLIC ACID PO Take 034 mg by mouth daily.    [provider]  IBUPROFEN PO Take by mouth.    [provider]  losartan-hydrochlorothiazide (HYZAAR) 50-12.5 MG tablet Take 1.5 tablets by mouth daily. 08/05/16   Lucretia Kern, DO  metFORMIN (GLUCOPHAGE) 1000 MG tablet Take 1 tablet (1,000 mg total) by mouth 2 (two) times daily with a meal. 01/06/17   Lucretia Kern, DO  Misc Natural Products (TURMERIC CURCUMIN)  CAPS Take 1 capsule by mouth every other day. 08/23/16   Lucretia Kern, DO  mometasone (ELOCON) 0.1 % cream Apply 1 application topically daily. 04/03/15   Lucretia Kern, DO  omeprazole (PRILOSEC) 20 MG capsule TAKE ONE CAPSULE BY MOUTH DAILY 06/15/16   Colin Benton R, DO  ORTHO MICRONOR 0.35 MG tablet Take 1 tablet (0.35 mg total) by mouth daily. 08/13/16   Kem Boroughs, FNP  triamcinolone cream (KENALOG) 0.1 %  08/29/15   [provider]  vitamin B-12 (CYANOCOBALAMIN) 1000 MCG tablet Take 1,000 mcg by mouth daily.    [provider]    Family History Family History  Problem Relation Age of Onset  . Hyperlipidemia Mother   . Hypertension Mother   . Hypertension Father   . Stroke Father   . Breast cancer Paternal Grandmother        Age 60's  . Diabetes Mellitus II Paternal Grandmother   . Stroke Paternal Grandfather   . Hypertension Sister   . Heart failure Maternal Grandfather   . Ovarian cancer Paternal Aunt        Age 42/58   . Ovarian cancer Other        Great Maternal Aunt     Social History Social History  Substance Use Topics  . Smoking status: Never Smoker  . Smokeless tobacco: Never Used  . Alcohol use 0.0 oz/week     Comment: occasionally     Allergies   Oak bark [quercus robur] and Soap   Review of Systems Review of Systems  Constitutional: Negative for chills, fever and weight loss.  HENT: Negative.   Eyes: Negative for visual disturbance.  Respiratory: Negative for chest tightness and shortness of breath.   Cardiovascular: Negative for chest pain.  Gastrointestinal: Negative for abdominal pain, bowel incontinence, nausea and vomiting.  Genitourinary: Negative for bladder incontinence, dysuria, frequency and urgency.  Musculoskeletal: Positive for back pain. Negative for neck pain.  Skin: Negative for wound.  Neurological: Negative for weakness and headaches.     Physical Exam Updated Vital Signs BP 128/77 (BP Location: Left Arm)    Pulse 93   Temp 98.7 F (37.1 C) (Oral)   Resp 18   Ht 5\' 6"  (1.676 m)   Wt 96.2 kg (212 lb)   LMP 03/04/2017 (Exact Date)   SpO2 100%   BMI 34.22 kg/m   Physical Exam  Constitutional: She is oriented to person, place, and time. She appears well-developed and well-nourished. No distress.  HENT:  Head: Normocephalic and atraumatic.  Nose: Nose normal.  Mouth/Throat: Uvula is midline, oropharynx is clear and moist and mucous membranes are normal.  Eyes: EOM are normal.  Neck: Normal range of motion. Neck supple.  Cardiovascular:  Normal rate.   Pulmonary/Chest: Effort normal.  Abdominal: Soft. There is no tenderness.  Musculoskeletal:       Thoracic back: She exhibits tenderness (right side) and spasm. She exhibits normal pulse. Decreased range of motion: due to pain.       Back:  No tenderness over the spine.  Neurological: She is alert and oriented to person, place, and time. She has normal strength. No sensory deficit. Gait (due to pain) abnormal.  Reflex Scores:      Bicep reflexes are 2+ on the right side and 2+ on the left side.      Brachioradialis reflexes are 2+ on the right side and 2+ on the left side.      Patellar reflexes are 2+ on the right side and 2+ on the left side. No foot drag.   Skin: Skin is warm and dry.  Psychiatric: She has a normal mood and affect. Her behavior is normal.  Nursing note and vitals reviewed.    ED Treatments / Results  Labs (all labs ordered are listed, but only abnormal results are displayed) Procedures Procedures (including critical care time)  Medications Ordered in ED Medications  predniSONE (DELTASONE) tablet 60 mg (not administered)  cyclobenzaprine (FLEXERIL) tablet 10 mg (10 mg Oral Given 03/20/17 2130)     Initial Impression / Assessment and Plan / ED Course  I have reviewed the triage vital signs and the nursing notes. Patient with back pain.  No neurological deficits and normal neuro exam.  Patient can walk but  states is painful. Pain improved with flexeril and heat to the back.  No loss of bowel or bladder control.  No concern for cauda equina.  No fever, night sweats, weight loss, h/o cancer, IVDU.  RICE protocol and pain medicine indicated and discussed with patient. Patient plans to f/u with ortho tomorrow. One dose of prednisone given tonight and patient will discuss additional treatment with ortho.   Final Clinical Impressions(s) / ED Diagnoses   Final diagnoses:  Spasm of thoracic back muscle    New Prescriptions New Prescriptions   CYCLOBENZAPRINE (FLEXERIL) 10 MG TABLET    Take 1 tablet (10 mg total) by mouth 2 (two) times daily as needed for muscle spasms.   DICLOFENAC (VOLTAREN) 50 MG EC TABLET    Take 1 tablet (50 mg total) by mouth 2 (two) times daily.     Debroah Baller [place], [place] 03/20/17 2151    Tanna Furry, MD 04/08/17 [phone]

## 2017-03-20 NOTE — ED Triage Notes (Signed)
States since Friday c/o right sided back pain that runs down to right knee no trauma voiced no dysuria voiced.

## 2017-04-03 NOTE — Progress Notes (Signed)
HPI:  Due for foot exam, labs, eye exam in November Reports doing well for the most part. She threw her back out and went to the emergency room about a week ago. They gave her prednisone, diclofenac and muscle relaxer. She is doing better. She sees Dr. Shirlee Limerick for this will be following up with him. She did have some pain that radiated down her leg. She prefers nonsurgical options. She wants to get her lab work done today. She is fasting. She reports she's been trying to eat well and get some regular activity. Today no radicular symptoms, weakness, numbness, chest pain, shortness of breath.   Diabetes mellitus: -meds: metformin 1000 bid, arb -no known complications -wants to eventually stop medications  HTN/Obesity: -meds: losartan-hctz  GERD: -meds: prilosec as needed -stable  Anemia: -sees gyn for heavy menstrual bleeding -stool cards and anemia panel normal  ROS: See pertinent positives and negatives per HPI.  Past Medical History:  Diagnosis Date  . Abnormal Pap smear of cervix - hpv per report in 2014, seeing gyn 11/16/2013   normal pap with gyn 2015  . Arthritis    hand  . Diabetes mellitus without complication ([place])   . Foreign body of hand, right 07/2011   right MP  . GERD (gastroesophageal reflux disease)   . Headache(784.0)    sinus  . Hypertension    denies HTN, but takes Hyzaar   . Stenosing tenosynovitis of wrist 07/2011   right    History reviewed. No pertinent surgical history.  Family History  Problem Relation Age of Onset  . Hyperlipidemia Mother   . Hypertension Mother   . Hypertension Father   . Stroke Father   . Breast cancer Paternal Grandmother        Age 25's  . Diabetes Mellitus II Paternal Grandmother   . Stroke Paternal Grandfather   . Hypertension Sister   . Heart failure Maternal Grandfather   . Ovarian cancer Paternal Aunt        Age 52/58   . Ovarian cancer Other        Great Maternal Aunt     Social History   Socioeconomic  History  . Marital status: Single    Spouse name: None  . Number of children: None  . Years of education: None  . Highest education level: None  Social Needs  . Financial resource strain: None  . Food insecurity - worry: None  . Food insecurity - inability: None  . Transportation needs - medical: None  . Transportation needs - non-medical: None  Occupational History  . None  Tobacco Use  . Smoking status: Never Smoker  . Smokeless tobacco: Never Used  Substance and Sexual Activity  . Alcohol use: Yes    Alcohol/week: 0.0 oz    Comment: occasionally  . Drug use: No  . Sexual activity: No    Birth control/protection: Pill    Comment: Micronor  Other Topics Concern  . None  Social History Narrative   Work or School: works with senior center      Home Situation: lives alone      Spiritual Beliefs: none, Christian      Lifestyle: active at work but not otherwise; avoiding carbs              Current Outpatient Medications:  .  acetaminophen (TYLENOL) 650 MG CR tablet, Take 650 mg by mouth every 8 (eight) hours as needed for pain., Disp: , Rfl:  .  Aloe-Sodium Chloride (AYR  SALINE NASAL GEL NA), Place into the nose., Disp: , Rfl:  .  Cholecalciferol (VITAMIN D3) 400 UNITS CAPS, Take by mouth daily., Disp: , Rfl:  .  cyclobenzaprine (FLEXERIL) 10 MG tablet, Take 1 tablet (10 mg total) by mouth 2 (two) times daily as needed for muscle spasms., Disp: 20 tablet, Rfl: 0 .  diclofenac (VOLTAREN) 50 MG EC tablet, Take 1 tablet (50 mg total) by mouth 2 (two) times daily., Disp: 15 tablet, Rfl: 0 .  fluticasone (FLONASE) 50 MCG/ACT nasal spray, Place into both nostrils daily., Disp: , Rfl:  .  FOLIC ACID PO, Take 517 mg by mouth daily., Disp: , Rfl:  .  IBUPROFEN PO, Take by mouth., Disp: , Rfl:  .  losartan-hydrochlorothiazide (HYZAAR) 50-12.5 MG tablet, Take 1.5 tablets by mouth daily., Disp: 135 tablet, Rfl: 3 .  metFORMIN (GLUCOPHAGE) 1000 MG tablet, Take 1 tablet (1,000 mg  total) by mouth 2 (two) times daily with a meal., Disp: 180 tablet, Rfl: 1 .  Misc Natural Products (TURMERIC CURCUMIN) CAPS, Take 1 capsule by mouth every other day., Disp: 30 capsule, Rfl: 0 .  mometasone (ELOCON) 0.1 % cream, Apply 1 application topically daily., Disp: 45 g, Rfl: 1 .  omeprazole (PRILOSEC) 20 MG capsule, TAKE ONE CAPSULE BY MOUTH DAILY, Disp: 90 capsule, Rfl: 1 .  ORTHO MICRONOR 0.35 MG tablet, Take 1 tablet (0.35 mg total) by mouth daily., Disp: 3 Package, Rfl: 4 .  triamcinolone cream (KENALOG) 0.1 %, , Disp: , Rfl:  .  vitamin B-12 (CYANOCOBALAMIN) 1000 MCG tablet, Take 1,000 mcg by mouth daily., Disp: , Rfl:   EXAM:  Vitals:   04/04/17 0949  BP: 132/88  Pulse: 86  Temp: 98.1 F (36.7 C)    Body mass index is 34.96 kg/m.  GENERAL: vitals reviewed and listed above, alert, oriented, appears well hydrated and in no acute distress  HEENT: atraumatic, conjunttiva clear, no obvious abnormalities on inspection of external nose and ears  NECK: no obvious masses on inspection  LUNGS: clear to auscultation bilaterally, no wheezes, rales or rhonchi, good air movement  CV: HRRR, no peripheral edema  MS: moves all extremities without noticeable abnormality  PSYCH: pleasant and cooperative, no obvious depression or anxiety  ASSESSMENT AND PLAN:  Discussed the following assessment and plan:  Type 2 diabetes mellitus without complication, without long-term current use of insulin (HCC) - Plan: Hemoglobin A1c  Hypertension associated with diabetes ([place]) - Plan: Basic metabolic panel, CBC  BMI 61.6-07.3,XTGGY - Plan: Lipid panel  Gastroesophageal reflux disease, esophagitis presence not specified  Anemia, unspecified type  Chronic low back pain with sciatica, sciatica laterality unspecified, unspecified back pain laterality  -labs - may be off given recent meds for back pain -foot exam done -lifestyle recs -cont current meds -home core video for chronic back  issues, seeing specialist -she wants to try omt for her back pain, advised her to check on insurance coverage first, but happy to help   -Patient advised to return or notify a doctor immediately if symptoms worsen or persist or new concerns arise.  Patient Instructions  BEFORE YOU LEAVE: -follow up: 3-4 months  Please schedule your eye exam.  Follow up with your back specialist about the back pain. Consider osteopathic treatments if covered by your insurance.  Consider the following video 5 days per week to help your back pain. A good intro video is: "Independence from Pain 7-minute Video" - [email or web address].com   We have ordered labs or studies at  this visit. It can take up to 1-2 weeks for results and processing. IF results require follow up or explanation, we will call you with instructions. Clinically stable results will be released to your [facility] If you have not heard from [place] or cannot find your results in [facility] in 2 weeks please contact our office at [phone].  If you are not yet signed up for [facility], please consider signing up.   We recommend the following healthy lifestyle for LIFE: 1) Small portions.   Tip: eat off of a salad plate instead of a dinner plate.  Tip: It is ok to feel hungry after a meal - that likely means you ate an appropriate portion.  Tip: if you need more or a snack choose fruits, veggies and/or a handful of nuts or seeds.  2) Eat a healthy clean diet.   TRY TO EAT: -at least 5-7 servings of low sugar vegetables per day (not corn, potatoes or bananas.) -berries are the best choice if you wish to eat fruit.   -lean meets (fish, chicken or [place] breasts) -vegan proteins for some meals - beans or tofu, whole grains, nuts and seeds -Replace bad fats with good fats - good fats include: fish, nuts and seeds, canola oil, olive oil -small amounts of low fat or non fat dairy -small amounts of100 % whole grains - check the  lables  AVOID: -SUGAR, sweets, anything with added sugar, corn syrup or sweeteners -if you must have a sweetener, small amounts of stevia may be best -sweetened beverages -simple starches (rice, bread, potatoes, pasta, chips, etc - small amounts of 100% whole grains are ok) -red meat, pork, butter -fried foods, fast food, processed food, excessive dairy, eggs and coconut.  3)Get at least 150 minutes of sweaty aerobic exercise per week.  4)Reduce stress - consider counseling, meditation and relaxation to balance other aspects of your life.            Colin Benton R., DO

## 2017-04-04 ENCOUNTER — Encounter: Payer: Self-pay | Admitting: Family Medicine

## 2017-04-04 ENCOUNTER — Ambulatory Visit (INDEPENDENT_AMBULATORY_CARE_PROVIDER_SITE_OTHER): Payer: 59 | Admitting: Family Medicine

## 2017-04-04 VITALS — BP 132/88 | HR 86 | Temp 98.1°F | Ht 66.0 in | Wt 216.6 lb

## 2017-04-04 DIAGNOSIS — E119 Type 2 diabetes mellitus without complications: Secondary | ICD-10-CM

## 2017-04-04 DIAGNOSIS — G8929 Other chronic pain: Secondary | ICD-10-CM

## 2017-04-04 DIAGNOSIS — I10 Essential (primary) hypertension: Secondary | ICD-10-CM | POA: Diagnosis not present

## 2017-04-04 DIAGNOSIS — Z6834 Body mass index (BMI) 34.0-34.9, adult: Secondary | ICD-10-CM | POA: Diagnosis not present

## 2017-04-04 DIAGNOSIS — M5440 Lumbago with sciatica, unspecified side: Secondary | ICD-10-CM | POA: Diagnosis not present

## 2017-04-04 DIAGNOSIS — E1159 Type 2 diabetes mellitus with other circulatory complications: Secondary | ICD-10-CM

## 2017-04-04 DIAGNOSIS — D649 Anemia, unspecified: Secondary | ICD-10-CM | POA: Diagnosis not present

## 2017-04-04 DIAGNOSIS — I152 Hypertension secondary to endocrine disorders: Secondary | ICD-10-CM

## 2017-04-04 DIAGNOSIS — K219 Gastro-esophageal reflux disease without esophagitis: Secondary | ICD-10-CM

## 2017-04-04 LAB — CBC
HEMATOCRIT: 37.2 % (ref 36.0–46.0)
Hemoglobin: 12 g/dL (ref 12.0–15.0)
MCHC: 32.2 g/dL (ref 30.0–36.0)
MCV: 85.5 fl (ref 78.0–100.0)
Platelets: 278 10*3/uL (ref 150.0–400.0)
RBC: 4.35 Mil/uL (ref 3.87–5.11)
RDW: 13.9 % (ref 11.5–15.5)
WBC: 4.9 10*3/uL (ref 4.0–10.5)

## 2017-04-04 LAB — LIPID PANEL
CHOL/HDL RATIO: 3
Cholesterol: 147 mg/dL (ref 0–200)
HDL: 53.3 mg/dL (ref >39.00)
LDL CALC: 81 mg/dL (ref 0–99)
NONHDL: 94.02
Triglycerides: 66 mg/dL (ref 0.0–149.0)
VLDL: 13.2 mg/dL (ref 0.0–40.0)

## 2017-04-04 LAB — BASIC METABOLIC PANEL
BUN: 17 mg/dL (ref 6–23)
CHLORIDE: 103 meq/L (ref 96–112)
CO2: 27 mEq/L (ref 19–32)
CREATININE: 0.84 mg/dL (ref 0.40–1.20)
Calcium: 9.2 mg/dL (ref 8.4–10.5)
GFR: 95.32 mL/min (ref >60.00)
Glucose, Bld: 109 mg/dL — ABNORMAL HIGH (ref 70–99)
POTASSIUM: 3.7 meq/L (ref 3.5–5.1)
Sodium: 137 mEq/L (ref 135–145)

## 2017-04-04 LAB — HEMOGLOBIN A1C: Hgb A1c MFr Bld: 6.3 % (ref 4.6–6.5)

## 2017-04-04 NOTE — Patient Instructions (Addendum)
BEFORE YOU LEAVE: -follow up: 3-4 months  Please schedule your eye exam.  Follow up with your back specialist about the back pain. Consider osteopathic treatments if covered by your insurance.  Consider the following video 5 days per week to help your back pain. A good intro video is: "Independence from Pain 7-minute Video" - [email or web address].com   We have ordered labs or studies at this visit. It can take up to 1-2 weeks for results and processing. IF results require follow up or explanation, we will call you with instructions. Clinically stable results will be released to your [facility]. If you have not heard from [place] or cannot find your results in [facility] in 2 weeks please contact our office at [phone].  If you are not yet signed up for [facility], please consider signing up.   We recommend the following healthy lifestyle for LIFE: 1) Small portions.   Tip: eat off of a salad plate instead of a dinner plate.  Tip: It is ok to feel hungry after a meal - that likely means you ate an appropriate portion.  Tip: if you need more or a snack choose fruits, veggies and/or a handful of nuts or seeds.  2) Eat a healthy clean diet.   TRY TO EAT: -at least 5-7 servings of low sugar vegetables per day (not corn, potatoes or bananas.) -berries are the best choice if you wish to eat fruit.   -lean meets (fish, chicken or [place] breasts) -vegan proteins for some meals - beans or tofu, whole grains, nuts and seeds -Replace bad fats with good fats - good fats include: fish, nuts and seeds, canola oil, olive oil -small amounts of low fat or non fat dairy -small amounts of100 % whole grains - check the lables  AVOID: -SUGAR, sweets, anything with added sugar, corn syrup or sweeteners -if you must have a sweetener, small amounts of stevia may be best -sweetened beverages -simple starches (rice, bread, potatoes, pasta, chips, etc - small amounts of 100% whole grains are  ok) -red meat, pork, butter -fried foods, fast food, processed food, excessive dairy, eggs and coconut.  3)Get at least 150 minutes of sweaty aerobic exercise per week.  4)Reduce stress - consider counseling, meditation and relaxation to balance other aspects of your life.

## 2017-04-25 DIAGNOSIS — M545 Low back pain: Secondary | ICD-10-CM | POA: Diagnosis not present

## 2017-04-25 DIAGNOSIS — M25561 Pain in right knee: Secondary | ICD-10-CM | POA: Diagnosis not present

## 2017-04-25 DIAGNOSIS — M25562 Pain in left knee: Secondary | ICD-10-CM | POA: Diagnosis not present

## 2017-05-02 DIAGNOSIS — M545 Low back pain: Secondary | ICD-10-CM | POA: Diagnosis not present

## 2017-05-03 DIAGNOSIS — M545 Low back pain: Secondary | ICD-10-CM | POA: Diagnosis not present

## 2017-06-15 ENCOUNTER — Other Ambulatory Visit (INDEPENDENT_AMBULATORY_CARE_PROVIDER_SITE_OTHER): Payer: 59

## 2017-06-15 DIAGNOSIS — D649 Anemia, unspecified: Secondary | ICD-10-CM | POA: Diagnosis not present

## 2017-06-15 LAB — CBC WITH DIFFERENTIAL/PLATELET
BASOS PCT: 0.8 % (ref 0.0–3.0)
Basophils Absolute: 0 10*3/uL (ref 0.0–0.1)
Eosinophils Absolute: 0.1 10*3/uL (ref 0.0–0.7)
Eosinophils Relative: 2 % (ref 0.0–5.0)
HCT: 34.3 % — ABNORMAL LOW (ref 36.0–46.0)
Hemoglobin: 11.1 g/dL — ABNORMAL LOW (ref 12.0–15.0)
Lymphocytes Relative: 31.7 % (ref 12.0–46.0)
Lymphs Abs: 1.5 10*3/uL (ref 0.7–4.0)
MCHC: 32.2 g/dL (ref 30.0–36.0)
MCV: 85.7 fl (ref 78.0–100.0)
MONO ABS: 0.4 10*3/uL (ref 0.1–1.0)
MONOS PCT: 8.9 % (ref 3.0–12.0)
NEUTROS ABS: 2.6 10*3/uL (ref 1.4–7.7)
NEUTROS PCT: 56.6 % (ref 43.0–77.0)
PLATELETS: 274 10*3/uL (ref 150.0–400.0)
RBC: 4 Mil/uL (ref 3.87–5.11)
RDW: 14.3 % (ref 11.5–15.5)
WBC: 4.7 10*3/uL (ref 4.0–10.5)

## 2017-06-17 ENCOUNTER — Other Ambulatory Visit: Payer: Self-pay | Admitting: *Deleted

## 2017-06-17 ENCOUNTER — Telehealth: Payer: Self-pay | Admitting: Hematology and Oncology

## 2017-06-17 DIAGNOSIS — D649 Anemia, unspecified: Secondary | ICD-10-CM

## 2017-06-17 NOTE — Telephone Encounter (Signed)
Spoke with patient regarding appointment date/time/location & phone #  °

## 2017-06-28 ENCOUNTER — Telehealth: Payer: Self-pay | Admitting: Obstetrics and Gynecology

## 2017-06-28 DIAGNOSIS — D649 Anemia, unspecified: Secondary | ICD-10-CM

## 2017-06-28 DIAGNOSIS — N926 Irregular menstruation, unspecified: Secondary | ICD-10-CM

## 2017-06-28 NOTE — Telephone Encounter (Signed)
Patient called requesting to speak with the nurse about recent "low hemoglobin blood test" results done at the [facility]. She said she is concerned this low level may be due to her fibroids and would like to speak with the nurse further.

## 2017-06-28 NOTE — Telephone Encounter (Signed)
Spoke with patient, states she is driving will return call.

## 2017-06-29 NOTE — Telephone Encounter (Signed)
Spoke with patient. LMP "1-2 wks ago", experienced 2 days of "clumpy" bleeding followed by very little flow. Cramping with menses, mor on right side, unchanged since last OV 03/17/17. No longer experiencing nipple soreness, had been before.   Patient states PCP referred to Oncology for further evaluation of low hgb. Scheduled with Dr. Lebron Conners on 2/18.   Not currently SA.   Stopped Micronor in October d/t cost, would like to restart.   Patient states she discussed fibroids at last OV with Dr. Quincy Simmonds, is this still a possible cause for my bleeding? PUS needed?   Advised patient OV recommended for further evaluation, would update and review with Dr. Quincy Simmonds prior to scheduling. Will return call with recommendations, patient is agreeable.  Dr. Quincy Simmonds -please advise OV or PUS? Ok to restart micronor?

## 2017-06-29 NOTE — Telephone Encounter (Signed)
Please schedule pelvic ultrasound and recheck with me.  We can discuss Micronor then and decide if this is the best option for her.  Thank you.

## 2017-06-30 NOTE — Telephone Encounter (Signed)
Spoke with patient, advised as seen below per Dr. Quincy Simmonds. PUS scheduled for 07/07/17 at 10:30am with consult to follow at 11am with Dr. Quincy Simmonds. Patient verbalizes understanding and is agreeable.  Order placed for PUS.   Routing to provider for final review. Patient is agreeable to disposition. Will close encounter.   Cc: Lerry Liner

## 2017-07-01 DIAGNOSIS — E119 Type 2 diabetes mellitus without complications: Secondary | ICD-10-CM | POA: Diagnosis not present

## 2017-07-01 LAB — HM DIABETES EYE EXAM

## 2017-07-04 ENCOUNTER — Telehealth: Payer: Self-pay | Admitting: Obstetrics and Gynecology

## 2017-07-04 NOTE — Telephone Encounter (Signed)
Call placed to patient to review benefits for scheduled ultrasound. Left voicemail message requesting a return call °

## 2017-07-05 ENCOUNTER — Encounter: Payer: Self-pay | Admitting: Family Medicine

## 2017-07-05 ENCOUNTER — Ambulatory Visit: Payer: 59 | Admitting: Family Medicine

## 2017-07-05 NOTE — Telephone Encounter (Signed)
Patient returned call. Reviewed benefits for scheduled ultrasound. Patient understood and agreeable. Patient scheduled 07/07/17 with Dr Quincy Simmonds. Patient aware of appointment date, arrival time and cancellation policy.   Patient would like to speak with a nurse in regards to new, additional symptoms she is experiencing. Patient states on Saturday, she began to have cramping. Its mild and not severe. Patient states she will be available until 11:30 AM today, it we are are not able to contact before then, patient request a return call at 2:00 PM today.  Routing to Triage Nurse

## 2017-07-05 NOTE — Telephone Encounter (Signed)
Spoke with patient, states she is unable to talk, will return call.

## 2017-07-05 NOTE — Telephone Encounter (Signed)
Spoke with patient. Reports intermittent,  "menses like cramping" since 07/02/17. Feels like cycle is going to start. 3/10 on pain scale.   Denies any other GYN symptoms, bleeding, N/V, fever/chills.   Recommended patient keep PUS as scheduled with Dr. Quincy Simmonds on 2/7. Return call to office if pain becomes severe or new symptoms develop. Seek immediate care at local ER/ [facility] if after hours.   Patient states she does not like to take pain medication, recommended heating pad for comfort. Dr. Quincy Simmonds is out of the office, covering provider will review, will return call with any additional recommendations, patient is agreeable.   Routing to covering provider for final review. Patient is agreeable to disposition. Will close encounter.  Cc: Dr. Quincy Simmonds

## 2017-07-06 NOTE — Progress Notes (Signed)
HPI:  Linda Conrad is a pleasant 42 y.o. here for follow up. Chronic medical problems summarized below were reviewed for changes and stability and were updated as needed below. These issues and their treatment remain stable for the most part.  Could be better with diet and exercise.  She will be seeing hematologist tomorrow about her anemia, she wants to see them as she is quite anxious about this.  It is very mild and it seems her grandmother had anemia.  He denies any bleeding, reports she really is not having any menstrual bleeding that is heavy.  Have clot with her last saw gynecology for evaluation.  She is seeing an orthopedic doctor about some right mid back pain, they feel this is muscular and advised some exercises.  The exercises did not really help.  Still has a sharp pain movements.  Plans to follow back up with her orthopedic doctor.  Gynecologist looked at her kidneys on the ultrasound when she mentioned this and told her they looked good.  Denies CP, SOB, DOE, treatment intolerance or new symptoms.   Diabetes mellitus: -meds: metformin 1000 bid, arb -no known complications -wants to eventually stop medications - prefers not to take medications, not on statin or asa  HTN/Obesity: -meds: losartan-hctz  GERD: -meds: prilosecas needed -stable  Anemia: -sees gyn for heavy menstrual bleeding -stool cards and anemia panel normal   ROS: See pertinent positives and negatives per HPI.  Past Medical History:  Diagnosis Date  . Abnormal Pap smear of cervix - hpv per report in 2014, seeing gyn 11/16/2013   normal pap with gyn 2015  . Arthritis    hand  . Diabetes mellitus without complication ([place])   . Foreign body of hand, right 07/2011   right MP  . GERD (gastroesophageal reflux disease)   . Headache(784.0)    sinus  . Hypertension    denies HTN, but takes Hyzaar   . Stenosing tenosynovitis of wrist 07/2011   right    Past Surgical History:  Procedure  Laterality Date  . DORSAL COMPARTMENT RELEASE  08/26/2011   Procedure: RELEASE DORSAL COMPARTMENT (DEQUERVAIN);  Surgeon: Cammie Sickle., MD;  Location: [facility];  Service: Orthopedics;  Laterality: Right;  . FOREIGN BODY REMOVAL  08/26/2011   Procedure: FOREIGN BODY REMOVAL ADULT;  Surgeon: Cammie Sickle., MD;  Location: [facility];  Service: Orthopedics;  Laterality: Right;    Family History  Problem Relation Age of Onset  . Hyperlipidemia Mother   . Hypertension Mother   . Hypertension Father   . Stroke Father   . Breast cancer Paternal Grandmother        Age 33's  . Diabetes Mellitus II Paternal Grandmother   . Stroke Paternal Grandfather   . Hypertension Sister   . Heart failure Maternal Grandfather   . Ovarian cancer Paternal Aunt        Age 68/58   . Ovarian cancer Other        Great Maternal Aunt     Social History   Socioeconomic History  . Marital status: Single    Spouse name: None  . Number of children: None  . Years of education: None  . Highest education level: None  Social Needs  . Financial resource strain: None  . Food insecurity - worry: None  . Food insecurity - inability: None  . Transportation needs - medical: None  . Transportation needs - non-medical: None  Occupational History  .  None  Tobacco Use  . Smoking status: Never Smoker  . Smokeless tobacco: Never Used  Substance and Sexual Activity  . Alcohol use: Yes    Alcohol/week: 0.0 oz    Comment: occasionally  . Drug use: No  . Sexual activity: No    Birth control/protection: Pill    Comment: Micronor  Other Topics Concern  . None  Social History Narrative   Work or School: works with senior center      Home Situation: lives alone      Spiritual Beliefs: none, Christian      Lifestyle: active at work but not otherwise; avoiding carbs              Current Outpatient Medications:  .  acetaminophen (TYLENOL) 650 MG CR tablet, Take 650 mg  by mouth every 8 (eight) hours as needed for pain., Disp: , Rfl:  .  Aloe-Sodium Chloride (AYR SALINE NASAL GEL NA), Place into the nose., Disp: , Rfl:  .  Cholecalciferol (VITAMIN D3) 400 UNITS CAPS, Take by mouth daily., Disp: , Rfl:  .  cyclobenzaprine (FLEXERIL) 10 MG tablet, Take 1 tablet (10 mg total) by mouth 2 (two) times daily as needed for muscle spasms., Disp: 20 tablet, Rfl: 0 .  diclofenac (VOLTAREN) 50 MG EC tablet, Take 1 tablet (50 mg total) by mouth 2 (two) times daily., Disp: 15 tablet, Rfl: 0 .  fluticasone (FLONASE) 50 MCG/ACT nasal spray, Place into both nostrils daily., Disp: , Rfl:  .  FOLIC ACID PO, Take 258 mg by mouth daily., Disp: , Rfl:  .  IBUPROFEN PO, Take by mouth., Disp: , Rfl:  .  losartan-hydrochlorothiazide (HYZAAR) 50-12.5 MG tablet, Take 1.5 tablets by mouth daily., Disp: 135 tablet, Rfl: 3 .  metFORMIN (GLUCOPHAGE) 1000 MG tablet, Take 1 tablet (1,000 mg total) by mouth 2 (two) times daily with a meal., Disp: 180 tablet, Rfl: 1 .  Misc Natural Products (TURMERIC CURCUMIN) CAPS, Take 1 capsule by mouth every other day., Disp: 30 capsule, Rfl: 0 .  mometasone (ELOCON) 0.1 % cream, Apply 1 application topically daily., Disp: 45 g, Rfl: 1 .  omeprazole (PRILOSEC) 20 MG capsule, TAKE ONE CAPSULE BY MOUTH DAILY, Disp: 90 capsule, Rfl: 1 .  ORTHO MICRONOR 0.35 MG tablet, Take 1 tablet (0.35 mg total) by mouth daily., Disp: 3 Package, Rfl: 0 .  triamcinolone cream (KENALOG) 0.1 %, , Disp: , Rfl:  .  vitamin B-12 (CYANOCOBALAMIN) 1000 MCG tablet, Take 1,000 mcg by mouth daily., Disp: , Rfl:   EXAM:  Vitals:   07/07/17 1517  BP: 112/86  Pulse: 98  Temp: 98.3 F (36.8 C)    Body mass index is 34.75 kg/m.  GENERAL: vitals reviewed and listed above, alert, oriented, appears well hydrated and in no acute distress  HEENT: atraumatic, conjunttiva clear, no obvious abnormalities on inspection of external nose and ears  NECK: no obvious masses on  inspection  LUNGS: clear to auscultation bilaterally, no wheezes, rales or rhonchi, good air movement  CV: HRRR, no peripheral edema  MS: moves all extremities without noticeable abnormality, tenderness to palpation in the thoracic lateral back soft tissue  PSYCH: pleasant and cooperative, no obvious depression or anxiety  ASSESSMENT AND PLAN:  Discussed the following assessment and plan:  Type 2 diabetes mellitus without complication, without long-term current use of insulin (HCC) Hypertension associated with diabetes (HCC) BMI 34.0-34.9,adult -Lifestyle recommendations and weight reduction advised -Labs at next appointment, consider statin given diabetes  Chronic  right-sided thoracic back pain -she plans to follow back up with ortho - advised we could do CT to eval for no-musculoskeletal causes of pain - she opted to check with ortho first.  Anemia, unspecified type -mild, sees hematology for eval tomorrow  -Patient advised to return or notify a doctor immediately if symptoms worsen or persist or new concerns arise.  Patient Instructions  BEFORE YOU LEAVE: -follow up: 3 months - will do labs then  Please see your orthopedic doctor for follow up for the back pain. Follow up with [place] if they feel is not musculoskeletal.   We recommend the following healthy lifestyle for LIFE: 1) Small portions. But, make sure to get regular (at least 3 per day), healthy meals and small healthy snacks if needed.  2) Eat a healthy clean diet.   TRY TO EAT: -at least 5-7 servings of low sugar, colorful, and nutrient rich vegetables per day (not corn, potatoes or bananas.) -berries are the best choice if you wish to eat fruit (only eat small amounts if trying to reduce weight)  -lean meets (fish, white meat of chicken or [place]) -vegan proteins for some meals - beans or tofu, whole grains, nuts and seeds -Replace bad fats with good fats - good fats include: fish, nuts and seeds, canola oil,  olive oil -small amounts of low fat or non fat dairy -small amounts of100 % whole grains - check the lables -drink plenty of water  AVOID: -SUGAR, sweets, anything with added sugar, corn syrup or sweeteners - must read labels as even foods advertised as "healthy" often are loaded with sugar -if you must have a sweetener, small amounts of stevia may be best -sweetened beverages and artificially sweetened beverages -simple starches (rice, bread, potatoes, pasta, chips, etc - small amounts of 100% whole grains are ok) -red meat, pork, butter -fried foods, fast food, processed food, excessive dairy, eggs and coconut.  3)Get at least 150 minutes of sweaty aerobic exercise per week.  4)Reduce stress - consider counseling, meditation and relaxation to balance other aspects of your life.     Lucretia Kern, DO

## 2017-07-06 NOTE — Progress Notes (Signed)
GYNECOLOGY  VISIT   HPI: 42 y.o.   Single  African American  female   [place] with Patient's last menstrual period was 06/20/2017 (exact date).   here for pelvic ultrasound.  Having pelvic cramping not associated with menses.   Cramping starts before the cycle begins and then lasts 4 days even though lasts only 1 day.  She did have a large clot at one time but not continuous. (This was an usual pattern for her. ) Nagging pain with cycle but able to eat, sleep, and work.  Can be more right sided.  Menses now are once monthly. Usually lasts 5 days.  Pad changes 1 - 2 times during the day.  No bleeding through clothing or sheets.   Not sexually active.   Stopped Micronor in December.  May want to restart.   Going to the [facility] next week for evaluation for anemia.  Had a negative hemoccult with her PCP.   Some diarrhea twice a month.  Ice cream give her cramping.   GYNECOLOGIC HISTORY: Patient's last menstrual period was 06/20/2017 (exact date). Contraception:  Micronor Menopausal hormone therapy:  n/a Last mammogram:  08/03/16, 3D, Bi-Rads 1: Negative Last pap smear:   12/09/15, Negative with neg HR HPV 05/21/14, Negative with neg HR HPV 06/01/12, Negative with pos HR HPV        OB History    Gravida Para Term Preterm AB Living   0 0 0 0 0 0   SAB TAB Ectopic Multiple Live Births   0 0 0 0 0         Patient Active Problem List   Diagnosis Date Noted  . Hx of abnormal cervical Pap smear 08/01/2015  . Diabetes mellitus ([place]) 09/13/2014  . GERD (gastroesophageal reflux disease) 11/16/2013  . Hearing loss - followed by [facility] 11/16/2013    Past Medical History:  Diagnosis Date  . Abnormal Pap smear of cervix - hpv per report in 2014, seeing gyn 11/16/2013   normal pap with gyn 2015  . Arthritis    hand  . Diabetes mellitus without complication ([place])   . Foreign body of hand, right 07/2011   right MP  . GERD (gastroesophageal  reflux disease)   . Headache(784.0)    sinus  . Hypertension    denies HTN, but takes Hyzaar   . Stenosing tenosynovitis of wrist 07/2011   right    Past Surgical History:  Procedure Laterality Date  . DORSAL COMPARTMENT RELEASE  08/26/2011   Procedure: RELEASE DORSAL COMPARTMENT (DEQUERVAIN);  Surgeon: Cammie Sickle., MD;  Location: [facility];  Service: Orthopedics;  Laterality: Right;  . FOREIGN BODY REMOVAL  08/26/2011   Procedure: FOREIGN BODY REMOVAL ADULT;  Surgeon: Cammie Sickle., MD;  Location: [facility];  Service: Orthopedics;  Laterality: Right;    Current Outpatient Medications  Medication Sig Dispense Refill  . acetaminophen (TYLENOL) 650 MG CR tablet Take 650 mg by mouth every 8 (eight) hours as needed for pain.    . Aloe-Sodium Chloride (AYR SALINE NASAL GEL NA) Place into the nose.    . Cholecalciferol (VITAMIN D3) 400 UNITS CAPS Take by mouth daily.    . cyclobenzaprine (FLEXERIL) 10 MG tablet Take 1 tablet (10 mg total) by mouth 2 (two) times daily as needed for muscle spasms. 20 tablet 0  . diclofenac (VOLTAREN) 50 MG EC tablet Take 1 tablet (50 mg total) by mouth 2 (two) times daily. 15  tablet 0  . fluticasone (FLONASE) 50 MCG/ACT nasal spray Place into both nostrils daily.    Marland Kitchen FOLIC ACID PO Take 413 mg by mouth daily.    . IBUPROFEN PO Take by mouth.    . losartan-hydrochlorothiazide (HYZAAR) 50-12.5 MG tablet Take 1.5 tablets by mouth daily. 135 tablet 3  . metFORMIN (GLUCOPHAGE) 1000 MG tablet Take 1 tablet (1,000 mg total) by mouth 2 (two) times daily with a meal. 180 tablet 1  . Misc Natural Products (TURMERIC CURCUMIN) CAPS Take 1 capsule by mouth every other day. 30 capsule 0  . mometasone (ELOCON) 0.1 % cream Apply 1 application topically daily. 45 g 1  . omeprazole (PRILOSEC) 20 MG capsule TAKE ONE CAPSULE BY MOUTH DAILY 90 capsule 1  . triamcinolone cream (KENALOG) 0.1 %     . vitamin B-12 (CYANOCOBALAMIN) 1000 MCG  tablet Take 1,000 mcg by mouth daily.    . ORTHO MICRONOR 0.35 MG tablet Take 1 tablet (0.35 mg total) by mouth daily. (Patient not taking: Reported on 07/07/2017) 3 Package 4   No current facility-administered medications for this visit.      ALLERGIES: Oak bark [quercus robur] and Soap  Family History  Problem Relation Age of Onset  . Hyperlipidemia Mother   . Hypertension Mother   . Hypertension Father   . Stroke Father   . Breast cancer Paternal Grandmother        Age 79's  . Diabetes Mellitus II Paternal Grandmother   . Stroke Paternal Grandfather   . Hypertension Sister   . Heart failure Maternal Grandfather   . Ovarian cancer Paternal Aunt        Age 38/58   . Ovarian cancer Other        Great Maternal Aunt     Social History   Socioeconomic History  . Marital status: Single    Spouse name: Not on file  . Number of children: Not on file  . Years of education: Not on file  . Highest education level: Not on file  Social Needs  . Financial resource strain: Not on file  . Food insecurity - worry: Not on file  . Food insecurity - inability: Not on file  . Transportation needs - medical: Not on file  . Transportation needs - non-medical: Not on file  Occupational History  . Not on file  Tobacco Use  . Smoking status: Never Smoker  . Smokeless tobacco: Never Used  Substance and Sexual Activity  . Alcohol use: Yes    Alcohol/week: 0.0 oz    Comment: occasionally  . Drug use: No  . Sexual activity: No    Birth control/protection: Pill    Comment: Micronor  Other Topics Concern  . Not on file  Social History Narrative   Work or School: works with senior center      Home Situation: lives alone      Spiritual Beliefs: none, Christian      Lifestyle: active at work but not otherwise; avoiding carbs             ROS:  Pertinent items are noted in HPI.  PHYSICAL EXAMINATION:    BP 122/80 (BP Location: Right Arm, Patient Position: Sitting, Cuff Size: Large)    Pulse 76   Ht 5\' 6"  (1.676 m)   Wt 213 lb (96.6 kg)   LMP 06/20/2017 (Exact Date)   BMI 34.38 kg/m     General appearance: alert, cooperative and appears stated age  Pelvic: External  genitalia:  no lesions              Urethra:  normal appearing urethra with no masses, tenderness or lesions              Bartholins and Skenes: normal                 Vagina: normal appearing vagina with normal color and discharge, no lesions              Cervix: no lesions                Bimanual Exam:  Uterus:  normal size, contour, position, consistency, mobility, non-tender              Adnexa: no mass, fullness, tenderness              Rectal exam: Yes.  .  Confirms.              Anus:  normal sphincter tone, no lesions  Chaperone was present for exam.  Pelvic US: Normal uterus.  EMS 5.36 mm. Normal ovaries.  9 mm left paratubal versus left paraovarian cyst. No free fluid. Normal right kidney.  ASSESSMENT  Pelvic cramping. Anemia.  Undetermined etiology.  I doubt gynecologic source. Lactose intolerance.   PLAN  Discussed normal pelvic US.  Restart Micronor for pelvic cramping.  Rx for 3 months to patient.  Complete anemia work up.  Follow up in March for annual exam.    An After Visit Summary was printed and given to the patient.  _15_____ minutes face to face time of which over 50% was spent in counseling.

## 2017-07-07 ENCOUNTER — Encounter: Payer: Self-pay | Admitting: Family Medicine

## 2017-07-07 ENCOUNTER — Ambulatory Visit (INDEPENDENT_AMBULATORY_CARE_PROVIDER_SITE_OTHER): Payer: 59 | Admitting: Family Medicine

## 2017-07-07 ENCOUNTER — Ambulatory Visit: Payer: 59 | Admitting: Obstetrics and Gynecology

## 2017-07-07 ENCOUNTER — Encounter: Payer: Self-pay | Admitting: Obstetrics and Gynecology

## 2017-07-07 ENCOUNTER — Ambulatory Visit (INDEPENDENT_AMBULATORY_CARE_PROVIDER_SITE_OTHER): Payer: 59

## 2017-07-07 VITALS — BP 122/80 | HR 76 | Ht 66.0 in | Wt 213.0 lb

## 2017-07-07 VITALS — BP 112/86 | HR 98 | Temp 98.3°F | Ht 66.0 in | Wt 215.3 lb

## 2017-07-07 DIAGNOSIS — I10 Essential (primary) hypertension: Secondary | ICD-10-CM

## 2017-07-07 DIAGNOSIS — D649 Anemia, unspecified: Secondary | ICD-10-CM | POA: Diagnosis not present

## 2017-07-07 DIAGNOSIS — G8929 Other chronic pain: Secondary | ICD-10-CM | POA: Diagnosis not present

## 2017-07-07 DIAGNOSIS — M546 Pain in thoracic spine: Secondary | ICD-10-CM

## 2017-07-07 DIAGNOSIS — R102 Pelvic and perineal pain: Secondary | ICD-10-CM

## 2017-07-07 DIAGNOSIS — Z6834 Body mass index (BMI) 34.0-34.9, adult: Secondary | ICD-10-CM | POA: Diagnosis not present

## 2017-07-07 DIAGNOSIS — E1159 Type 2 diabetes mellitus with other circulatory complications: Secondary | ICD-10-CM

## 2017-07-07 DIAGNOSIS — E119 Type 2 diabetes mellitus without complications: Secondary | ICD-10-CM

## 2017-07-07 DIAGNOSIS — I152 Hypertension secondary to endocrine disorders: Secondary | ICD-10-CM

## 2017-07-07 DIAGNOSIS — N926 Irregular menstruation, unspecified: Secondary | ICD-10-CM | POA: Diagnosis not present

## 2017-07-07 MED ORDER — ORTHO MICRONOR 0.35 MG PO TABS: 1.0000 | ORAL_TABLET | Freq: Every day | ORAL | 0 refills | Status: DC

## 2017-07-07 NOTE — Progress Notes (Signed)
Encounter reviewed by Dr. Ranyia Witting Amundson C. Silva.  

## 2017-07-07 NOTE — Patient Instructions (Signed)
BEFORE YOU LEAVE: -follow up: 3 months - will do labs then  Please see your orthopedic doctor for follow up for the back pain. Follow up with [place] if they feel is not musculoskeletal.   We recommend the following healthy lifestyle for LIFE: 1) Small portions. But, make sure to get regular (at least 3 per day), healthy meals and small healthy snacks if needed.  2) Eat a healthy clean diet.   TRY TO EAT: -at least 5-7 servings of low sugar, colorful, and nutrient rich vegetables per day (not corn, potatoes or bananas.) -berries are the best choice if you wish to eat fruit (only eat small amounts if trying to reduce weight)  -lean meets (fish, white meat of chicken or [place]) -vegan proteins for some meals - beans or tofu, whole grains, nuts and seeds -Replace bad fats with good fats - good fats include: fish, nuts and seeds, canola oil, olive oil -small amounts of low fat or non fat dairy -small amounts of100 % whole grains - check the lables -drink plenty of water  AVOID: -SUGAR, sweets, anything with added sugar, corn syrup or sweeteners - must read labels as even foods advertised as "healthy" often are loaded with sugar -if you must have a sweetener, small amounts of stevia may be best -sweetened beverages and artificially sweetened beverages -simple starches (rice, bread, potatoes, pasta, chips, etc - small amounts of 100% whole grains are ok) -red meat, pork, butter -fried foods, fast food, processed food, excessive dairy, eggs and coconut.  3)Get at least 150 minutes of sweaty aerobic exercise per week.  4)Reduce stress - consider counseling, meditation and relaxation to balance other aspects of your life.

## 2017-07-18 ENCOUNTER — Inpatient Hospital Stay: Payer: 59 | Attending: Hematology and Oncology | Admitting: Hematology and Oncology

## 2017-07-18 ENCOUNTER — Telehealth: Payer: Self-pay | Admitting: Hematology and Oncology

## 2017-07-18 ENCOUNTER — Encounter: Payer: Self-pay | Admitting: Hematology and Oncology

## 2017-07-18 ENCOUNTER — Inpatient Hospital Stay: Payer: 59

## 2017-07-18 VITALS — BP 126/64 | HR 85 | Temp 98.4°F | Resp 18 | Ht 66.0 in | Wt 211.9 lb

## 2017-07-18 DIAGNOSIS — G8929 Other chronic pain: Secondary | ICD-10-CM | POA: Diagnosis not present

## 2017-07-18 DIAGNOSIS — Z803 Family history of malignant neoplasm of breast: Secondary | ICD-10-CM | POA: Insufficient documentation

## 2017-07-18 DIAGNOSIS — D649 Anemia, unspecified: Secondary | ICD-10-CM | POA: Diagnosis not present

## 2017-07-18 DIAGNOSIS — E119 Type 2 diabetes mellitus without complications: Secondary | ICD-10-CM | POA: Diagnosis not present

## 2017-07-18 DIAGNOSIS — M549 Dorsalgia, unspecified: Secondary | ICD-10-CM | POA: Diagnosis not present

## 2017-07-18 DIAGNOSIS — Z8041 Family history of malignant neoplasm of ovary: Secondary | ICD-10-CM

## 2017-07-18 LAB — CBC WITH DIFFERENTIAL (CANCER CENTER ONLY)
BASOS ABS: 0 10*3/uL (ref 0.0–0.1)
BASOS PCT: 1 %
EOS ABS: 0.1 10*3/uL (ref 0.0–0.5)
EOS PCT: 2 %
HCT: 36.9 % (ref 34.8–46.6)
Hemoglobin: 12 g/dL (ref 11.6–15.9)
LYMPHS ABS: 2.3 10*3/uL (ref 0.9–3.3)
Lymphocytes Relative: 41 %
MCH: 27.6 pg (ref 25.1–34.0)
MCHC: 32.5 g/dL (ref 31.5–36.0)
MCV: 85 fL (ref 79.5–101.0)
Monocytes Absolute: 0.4 10*3/uL (ref 0.1–0.9)
Monocytes Relative: 7 %
NEUTROS PCT: 49 %
NRBC: 0 /100{WBCs}
Neutro Abs: 2.7 10*3/uL (ref 1.5–6.5)
PLATELETS: 295 10*3/uL (ref 145–400)
RBC: 4.34 MIL/uL (ref 3.70–5.45)
RDW: 14.3 % (ref 11.2–14.5)
WBC Count: 5.5 10*3/uL (ref 3.9–10.3)

## 2017-07-18 LAB — CMP (CANCER CENTER ONLY)
ALK PHOS: 96 U/L (ref 40–150)
ALT: 8 U/L (ref 0–55)
AST: 15 U/L (ref 5–34)
Albumin: 4.1 g/dL (ref 3.5–5.0)
Anion gap: 12 — ABNORMAL HIGH (ref 3–11)
BILIRUBIN TOTAL: 0.5 mg/dL (ref 0.2–1.2)
BUN: 17 mg/dL (ref 7–26)
CALCIUM: 9.9 mg/dL (ref 8.4–10.4)
CO2: 23 mmol/L (ref 22–29)
CREATININE: 0.92 mg/dL (ref 0.60–1.10)
Chloride: 104 mmol/L (ref 98–109)
GFR, Estimated: >60 mL/min (ref >60)
Glucose, Bld: 73 mg/dL (ref 70–140)
Potassium: 3.4 mmol/L — ABNORMAL LOW (ref 3.5–5.1)
Sodium: 139 mmol/L (ref 136–145)
TOTAL PROTEIN: 7.4 g/dL (ref 6.4–8.3)

## 2017-07-18 LAB — TECHNOLOGIST SMEAR REVIEW

## 2017-07-18 LAB — DIRECT ANTIGLOBULIN TEST (NOT AT ARMC)
DAT, COMPLEMENT: NEGATIVE
DAT, IgG: NEGATIVE

## 2017-07-18 LAB — LACTATE DEHYDROGENASE: LDH: 185 U/L (ref 125–245)

## 2017-07-18 LAB — C-REACTIVE PROTEIN

## 2017-07-18 LAB — SEDIMENTATION RATE: SED RATE: 7 mm/h (ref 0–22)

## 2017-07-18 NOTE — Telephone Encounter (Signed)
Appointment scheduled AVS/Calendar printed per 2/18 los °

## 2017-07-19 LAB — FERRITIN: Ferritin: 26 ng/mL (ref 9–269)

## 2017-07-19 LAB — IRON AND TIBC
IRON: 43 ug/dL (ref 41–142)
SATURATION RATIOS: 12 % — AB (ref 21–57)
TIBC: 361 ug/dL (ref 236–444)
UIBC: 318 ug/dL

## 2017-07-19 LAB — HAPTOGLOBIN: HAPTOGLOBIN: 165 mg/dL (ref 34–200)

## 2017-07-28 DIAGNOSIS — J3089 Other allergic rhinitis: Secondary | ICD-10-CM | POA: Diagnosis not present

## 2017-07-28 DIAGNOSIS — J301 Allergic rhinitis due to pollen: Secondary | ICD-10-CM | POA: Diagnosis not present

## 2017-07-28 DIAGNOSIS — J3081 Allergic rhinitis due to animal (cat) (dog) hair and dander: Secondary | ICD-10-CM | POA: Diagnosis not present

## 2017-08-01 ENCOUNTER — Encounter: Payer: Self-pay | Admitting: Hematology and Oncology

## 2017-08-01 ENCOUNTER — Inpatient Hospital Stay: Payer: 59 | Attending: Hematology and Oncology | Admitting: Hematology and Oncology

## 2017-08-01 ENCOUNTER — Telehealth: Payer: Self-pay | Admitting: Hematology and Oncology

## 2017-08-01 VITALS — BP 132/70 | HR 86 | Temp 98.4°F | Resp 18 | Ht 66.0 in | Wt 215.9 lb

## 2017-08-01 DIAGNOSIS — G8929 Other chronic pain: Secondary | ICD-10-CM | POA: Insufficient documentation

## 2017-08-01 DIAGNOSIS — E119 Type 2 diabetes mellitus without complications: Secondary | ICD-10-CM | POA: Diagnosis not present

## 2017-08-01 DIAGNOSIS — M549 Dorsalgia, unspecified: Secondary | ICD-10-CM | POA: Diagnosis not present

## 2017-08-01 DIAGNOSIS — D649 Anemia, unspecified: Secondary | ICD-10-CM | POA: Insufficient documentation

## 2017-08-01 NOTE — Telephone Encounter (Signed)
Scheduled appt per 3/4 los - Gave patient AVS and calender per los.  

## 2017-08-05 ENCOUNTER — Other Ambulatory Visit: Payer: Self-pay | Admitting: Family Medicine

## 2017-08-07 DIAGNOSIS — D649 Anemia, unspecified: Secondary | ICD-10-CM | POA: Insufficient documentation

## 2017-08-07 NOTE — Assessment & Plan Note (Signed)
42 y.o. Very mild anemia which appears to be normocytic and normochromic with no evidence of deficiency and folate, B12, or iron.  Patient is essentially asymptomatic at this time.  Differential for the referral problem is very broad.  Of note, patient has a very significant family history of malignancy including an early breast cancer in paternal grandmother and 2 relatives with ovarian cancer although those are on different sides of her lineage.  Plan: -Repeat labs today as outlined below. -Consult genetic counselor - Return to clinic in 2 weeks to review findings and discuss diagnosis.

## 2017-08-07 NOTE — Progress Notes (Signed)
Time Cancer New Visit:  Assessment: Anemia 42 y.o. Very mild anemia which appears to be normocytic and normochromic with no evidence of deficiency and folate, B12, or iron.  Patient is essentially asymptomatic at this time.  Differential for the referral problem is very broad.  Of note, patient has a very significant family history of malignancy including an early breast cancer in paternal grandmother and 2 relatives with ovarian cancer although those are on different sides of her lineage.  Plan: -Repeat labs today as outlined below. -Consult genetic counselor - Return to clinic in 2 weeks to review findings and discuss diagnosis.   Voice recognition software was used and creation of this note. Despite my best effort at editing the text, some misspelling/errors may have occurred. Orders Placed This Encounter  Procedures  . CBC with Differential (Cancer Center Only)    Standing Status:   Future    Number of Occurrences:   1    Standing Expiration Date:   07/18/2018  . CMP ([facility] only)    Standing Status:   Future    Number of Occurrences:   1    Standing Expiration Date:   07/18/2018  . Technologist smear review    Standing Status:   Future    Number of Occurrences:   1    Standing Expiration Date:   07/18/2018  . Lactate dehydrogenase (LDH)    Standing Status:   Future    Number of Occurrences:   1    Standing Expiration Date:   07/18/2018  . Haptoglobin    Standing Status:   Future    Number of Occurrences:   1    Standing Expiration Date:   07/18/2018  . Sedimentation rate    Standing Status:   Future    Number of Occurrences:   1    Standing Expiration Date:   07/18/2018  . C-reactive protein    Standing Status:   Future    Number of Occurrences:   1    Standing Expiration Date:   07/18/2018  . Ferritin    Standing Status:   Future    Number of Occurrences:   1    Standing Expiration Date:   07/18/2018  . Iron and TIBC    Standing Status:    Future    Number of Occurrences:   1    Standing Expiration Date:   07/18/2018  . Direct antiglobulin test ([facility])    Standing Status:   Future    Number of Occurrences:   1    Standing Expiration Date:   07/18/2018    All questions were answered. . The patient knows to call the clinic with any problems, questions or concerns.  This note was electronically signed.    History of Presenting Illness Linda Conrad 42 y.o. presenting to the [facility] for evaluation of anemia, referred by Dr Orvil Feil patient's past medical history significant for osteoarthritis,h R Kim.  Uncomplicated diabetes mellitus, chronic back pain, and possible hypertension.  Patient does not smoke and does not drink alcohol.  Her family history is remarkable for maternal grandmother with anemia, paternal grandmother with breast cancer in her 46s, paternal aunt with ovarian cancer at 39, and maternal grand aunt with ovarian cancer.  Patient reports no active symptoms at this time.  She does have regular menstrual periods, most recent one starting today associated with cramps.  Her last menstrual period was last month and lasted only 2 days.  She denies excessive bleeding during her menstrual periods, denies epistaxis, hemoptysis, hematochezia, or melena.  Denies fevers, chills, night sweats.  No swelling in the neck, armpits, or groin.  No nausea, vomiting, early satiety, abdominal pain, fullness, diarrhea, or constipation.  Oncological/hematological History: --Labs, 05/06/16: Hgb 12.3, MCV 83.2, MCH ..., MCHC 33.1, RDW 14.1, Plt 304; --Labs, 02/07/17: Hgb 11.4, MCV 85.5, MCH ..., MCHC 32.5, RDW 14.3, Plt 296; --Labs, 03/10/17: FE 63, FeSat 18%, TIBC 352, Ferritin 26, Folate & B12 WNL --Labs, 04/04/17: Hgb 12.0, MCV 85.5, MCH ..., MCHC 32.2, RDW 13.9, Plt 278; --Labs, 06/15/17: Hgb 11.1, MCV 85.7, MCH ..., MCHC 32.2, RDW 14.3, Plt 274;    Medical History: Past Medical History:  Diagnosis Date  . Abnormal Pap  smear of cervix - hpv per report in 2014, seeing gyn 11/16/2013   normal pap with gyn 2015  . Arthritis    hand  . Diabetes mellitus without complication ([facility])   . Foreign body of hand, right 07/2011   right MP  . GERD (gastroesophageal reflux disease)   . Headache(784.0)    sinus  . Hypertension    denies HTN, but takes Hyzaar   . Stenosing tenosynovitis of wrist 07/2011   right    Surgical History: Past Surgical History:  Procedure Laterality Date  . DORSAL COMPARTMENT RELEASE  08/26/2011   Procedure: RELEASE DORSAL COMPARTMENT ([facility]);  Surgeon: Cammie Sickle., MD;  Location: [facility];  Service: Orthopedics;  Laterality: Right;  . FOREIGN BODY REMOVAL  08/26/2011   Procedure: FOREIGN BODY REMOVAL ADULT;  Surgeon: Cammie Sickle., MD;  Location: [facility];  Service: Orthopedics;  Laterality: Right;    Family History: Family History  Problem Relation Age of Onset  . Hyperlipidemia Mother   . Hypertension Mother   . Hypertension Father   . Stroke Father   . Breast cancer Paternal Grandmother        Age 66's  . Diabetes Mellitus II Paternal Grandmother   . Stroke Paternal Grandfather   . Hypertension Sister   . Heart failure Maternal Grandfather   . Ovarian cancer Paternal Aunt        Age 60/58   . Ovarian cancer Other        Great Maternal Aunt     Social History: Social History   Socioeconomic History  . Marital status: Single    Spouse name: Not on file  . Number of children: Not on file  . Years of education: Not on file  . Highest education level: Not on file  Social Needs  . Financial resource strain: Not on file  . Food insecurity - worry: Not on file  . Food insecurity - inability: Not on file  . Transportation needs - medical: Not on file  . Transportation needs - non-medical: Not on file  Occupational History  . Not on file  Tobacco Use  . Smoking status: Never Smoker  . Smokeless tobacco: Never Used   Substance and Sexual Activity  . Alcohol use: Yes    Alcohol/week: 0.0 oz    Comment: occasionally  . Drug use: No  . Sexual activity: No    Birth control/protection: Pill    Comment: Micronor  Other Topics Concern  . Not on file  Social History Narrative   Work or School: works with senior center      Home Situation: lives alone      Spiritual Beliefs: none, Darrick Meigs  Lifestyle: active at work but not otherwise; avoiding carbs             Allergies: Allergies  Allergen Reactions  . Oak Bark [[place] trees  . Soap Rash    Medications:  Current Outpatient Medications  Medication Sig Dispense Refill  . acetaminophen (TYLENOL) 650 MG CR tablet Take 650 mg by mouth every 8 (eight) hours as needed for pain.    . Aloe-Sodium Chloride (AYR SALINE NASAL GEL NA) Place into the nose.    . Cholecalciferol (VITAMIN D3) 400 UNITS CAPS Take by mouth daily.    . cyclobenzaprine (FLEXERIL) 10 MG tablet Take 1 tablet (10 mg total) by mouth 2 (two) times daily as needed for muscle spasms. 20 tablet 0  . diclofenac (VOLTAREN) 50 MG EC tablet Take 1 tablet (50 mg total) by mouth 2 (two) times daily. 15 tablet 0  . fluticasone (FLONASE) 50 MCG/ACT nasal spray Place into both nostrils daily.    Marland Kitchen FOLIC ACID PO Take 017 mg by mouth daily.    Marland Kitchen losartan-hydrochlorothiazide (HYZAAR) 50-12.5 MG tablet Take 1.5 tablets by mouth daily. 135 tablet 3  . metFORMIN (GLUCOPHAGE) 1000 MG tablet Take 1 tablet (1,000 mg total) by mouth 2 (two) times daily with a meal. 180 tablet 1  . Misc Natural Products (TURMERIC CURCUMIN) CAPS Take 1 capsule by mouth every other day. 30 capsule 0  . mometasone (ELOCON) 0.1 % cream Apply 1 application topically daily. 45 g 1  . omeprazole (PRILOSEC) 20 MG capsule TAKE ONE CAPSULE BY MOUTH DAILY 90 capsule 1  . ORTHO MICRONOR 0.35 MG tablet Take 1 tablet (0.35 mg total) by mouth daily. 3 Package 0  . triamcinolone cream (KENALOG) 0.1 %     .  vitamin B-12 (CYANOCOBALAMIN) 1000 MCG tablet Take 1,000 mcg by mouth daily.    . IBUPROFEN PO Take by mouth.     No current facility-administered medications for this visit.     Review of Systems: Review of Systems  All other systems reviewed and are negative.    PHYSICAL EXAMINATION Blood pressure 126/64, pulse 85, temperature 98.4 F (36.9 C), temperature source Oral, resp. rate 18, height _0  (1.676 m), weight 211 lb 14.4 oz (96.1 kg), last menstrual period 06/20/2017, SpO2 100 %.  ECOG PERFORMANCE STATUS: 1 - Symptomatic but completely ambulatory  Physical Exam  Constitutional: She is oriented to person, place, and time and well-developed, well-nourished, and in no distress. No distress.  HENT:  Head: Normocephalic and atraumatic.  Mouth/Throat: Oropharynx is clear and moist. No oropharyngeal exudate.  Eyes: Conjunctivae and EOM are normal. Pupils are equal, round, and reactive to light. No scleral icterus.  Neck: No thyromegaly present.  Cardiovascular: Normal rate, regular rhythm, normal heart sounds and intact distal pulses.  No murmur heard. Pulmonary/Chest: Effort normal and breath sounds normal. No respiratory distress. She has no wheezes. She has no rales.  Abdominal: Soft. Bowel sounds are normal. She exhibits no distension and no mass. There is no tenderness. There is no guarding.  Musculoskeletal: She exhibits no edema.  Lymphadenopathy:    She has no cervical adenopathy.  Neurological: She is alert and oriented to person, place, and time. She has normal reflexes. No cranial nerve deficit.  Skin: Skin is warm and dry. No rash noted. She is not diaphoretic. No erythema. No pallor.     LABORATORY DATA: I have personally reviewed the data as listed: Appointment on 07/18/2017  Component  Date Value Ref Range Status  . Iron 07/18/2017 43  41 - 142 ug/dL Final  . TIBC 07/18/2017 361  236 - 444 ug/dL Final  . Saturation Ratios 07/18/2017 12* 21 - 57 % Final  . UIBC  07/18/2017 318  ug/dL Final   Performed at [facility], [place] [address], [place], [place] [address]  . Ferritin 07/18/2017 26  9 - 269 ng/mL Final   Performed at [facility], [place] [address] [place], [place] [address]  . CRP 07/18/2017 <0.8  <1.0 mg/dL Final   Performed at [place] [address] [place], [place] [address]  . Sed Rate 07/18/2017 7  0 - 22 mm/hr Final   Performed at [facility], [place] [address], [place], [place] [address]  . DAT, complement 07/18/2017 NEG   Final  . DAT, IgG 07/18/2017    Final                   Value:NEG Performed at [facility], [place] [address] [place], [place] [address]   . Haptoglobin 07/18/2017 165  34 - 200 mg/dL Final   Comment: (NOTE) Performed At: [facility] [place], [place] [id number] Rush Farmer MD AY:[id number] Performed at [facility], [place] [address], [place], [place] [address]   . LDH 07/18/2017 185  125 - 245 U/L Final   Performed at [facility], [place] [address] [place], [place] [address]  . Tech Review 07/18/2017 SMEAR CONSISTENT WITH AUTO DIFF, ADEQUATE PLATELETS, RBC MORPHOLOGY WITHIN NORMAL LIMITS   Final   Performed at [facility], [place] [address] [place], [place] [address]  . Sodium 07/18/2017 139  136 - 145 mmol/L Final  . Potassium 07/18/2017 3.4* 3.5 - 5.1 mmol/L Final  . Chloride 07/18/2017 104  98 - 109 mmol/L Final  . CO2 07/18/2017 23  22 - 29 mmol/L Final  . Glucose, Bld 07/18/2017 73  70 - 140 mg/dL Final  . BUN 07/18/2017 17  7 - 26 mg/dL Final  . Creatinine 07/18/2017 0.92  0.60 - 1.10 mg/dL Final  . Calcium 07/18/2017 9.9  8.4 - 10.4 mg/dL Final  . Total Protein 07/18/2017 7.4  6.4 - 8.3 g/dL Final  . Albumin 07/18/2017 4.1  3.5 - 5.0 g/dL Final  . AST 07/18/2017 15  5 - 34 U/L Final  . ALT 07/18/2017 8  0 - 55  U/L Final  . Alkaline Phosphatase 07/18/2017 96  40 - 150 U/L Final  . Total Bilirubin 07/18/2017 0.5  0.2 - 1.2 mg/dL Final  . GFR, Est Non Af Am 07/18/2017 >60  >60 mL/min Final  . GFR, Est AFR Am 07/18/2017 >60  >60 mL/min Final   Comment: (NOTE) The eGFR has been calculated using the CKD EPI equation. This calculation has not been validated in all clinical situations. eGFR's persistently <60 mL/min signify possible Chronic Kidney Disease.   Georgiann Hahn gap 07/18/2017 12* 3 - 11 Final   Performed at [facility], [place] [address] [place], [place] [address]  . WBC Count 07/18/2017 5.5  3.9 - 10.3 K/uL Final  . RBC 07/18/2017 4.34  3.70 - 5.45 MIL/uL Final  . Hemoglobin 07/18/2017 12.0  11.6 - 15.9 g/dL Final  . HCT 07/18/2017 36.9  34.8 - 46.6 % Final  . MCV 07/18/2017 85.0  79.5 - 101.0 fL Final  .  MCH 07/18/2017 27.6  25.1 - 34.0 pg Final  . MCHC 07/18/2017 32.5  31.5 - 36.0 g/dL Final  . RDW 07/18/2017 14.3  11.2 - 14.5 % Final  . Platelet Count 07/18/2017 295  145 - 400 K/uL Final  . Neutrophils Relative % 07/18/2017 49  % Final  . Neutro Abs 07/18/2017 2.7  1.5 - 6.5 K/uL Final  . Lymphocytes Relative 07/18/2017 41  % Final  . Lymphs Abs 07/18/2017 2.3  0.9 - 3.3 K/uL Final  . Monocytes Relative 07/18/2017 7  % Final  . Monocytes Absolute 07/18/2017 0.4  0.1 - 0.9 K/uL Final  . Eosinophils Relative 07/18/2017 2  % Final  . Eosinophils Absolute 07/18/2017 0.1  0.0 - 0.5 K/uL Final  . Basophils Relative 07/18/2017 1  % Final  . Basophils Absolute 07/18/2017 0.0  0.0 - 0.1 K/uL Final  . nRBC 07/18/2017 0  0 /100 WBC Final   Performed at [facility], [place] [address] [address], [place], [place] [address]         Ardath Sax, MD

## 2017-08-10 NOTE — Progress Notes (Signed)
[facility] Follow-up Visit:  Assessment: Anemia 42 y.o. Very mild anemia which appears to be normocytic and normochromic with no evidence of deficiency and folate, B12, or iron.  Repeat lab work demonstrates no current anemia with normal size and coloration of the red blood cells and no abnormalities in the white blood cell count or platelets.  Repeat iron levels are not significantly changed from October 2018 there is no evidence of inflammatory stye or ongoing hemolysis.  Mild anemia observed likely has to do with menstrual period which appears to be well compensated by dietary intake.  Plan: -No additional intervention necessary at this point in time. -Return to clinic in 6 months with repeat lab work.   Voice recognition software was used and creation of this note. Despite my best effort at editing the text, some misspelling/errors may have occurred.  Orders Placed This Encounter  Procedures  . CBC with Differential (Cancer Center Only)    Standing Status:   Future    Standing Expiration Date:   08/02/2018    Cancer Staging No matching staging information was found for the patient.  All questions were answered.  . The patient knows to call the clinic with any problems, questions or concerns.  This note was electronically signed.    History of Presenting Illness Linda Conrad is a 42 y.o. female followed in the [facility] for evaluation of anemia, referred by Dr Orvil Feil patient's past medical history significant for osteoarthritis,h R Kim.  Uncomplicated diabetes mellitus, chronic back pain, and possible hypertension.  Patient does not smoke and does not drink alcohol.  Her family history is remarkable for maternal grandmother with anemia, paternal grandmother with breast cancer in her 34s, paternal aunt with ovarian cancer at 45, and maternal grand aunt with ovarian cancer.  Patient reports no active symptoms at this time.  She does have regular menstrual  periods, most recent one starting today associated with cramps.  Her last menstrual period was last month and lasted only 2 days.  She denies excessive bleeding during her menstrual periods, denies epistaxis, hemoptysis, hematochezia, or melena.  Denies fevers, chills, night sweats.  No swelling in the neck, armpits, or groin.  No nausea, vomiting, early satiety, abdominal pain, fullness, diarrhea, or constipation.  Oncological/hematological History: --Labs, 05/06/16: Hgb 12.3, MCV 83.2, MCH     ..., MCHC 33.1, RDW 14.1, Plt 304; --Labs, 02/07/17: Hgb 11.4, MCV 85.5, MCH     ..., MCHC 32.5, RDW 14.3, Plt 296; --Labs, 03/10/17:            Fe 63, FeSat 18%, TIBC 352, Ferritin 26, Folate & B12 WNL --Labs, 04/04/17: Hgb 12.0, MCV 85.5, MCH     ..., MCHC 32.2, RDW 13.9, Plt 278; --Labs, 06/15/17: Hgb 11.1, MCV 85.7, MCH     ..., MCHC 32.2, RDW 14.3, Plt 274; --Labs, 07/18/17: Hgb 12.0, MCV 85.0, MCH 27.6, MCHC 32.5, RDW 14.3, Plt 295; Fe 43, FeSat 12%, TIBC 361, Ferritin 26; ESR 7, CRP <0.8, LDH 185, Haptoglobin 165, DAT negative    No history exists.    Medical History: Past Medical History:  Diagnosis Date  . Abnormal Pap smear of cervix - hpv per report in 2014, seeing gyn 11/16/2013   normal pap with gyn 2015  . Arthritis    hand  . Diabetes mellitus without complication ([place])   . Foreign body of hand, right 07/2011   right MP  . GERD (gastroesophageal reflux disease)   . Headache(784.0)  sinus  . Hypertension    denies HTN, but takes Hyzaar   . Stenosing tenosynovitis of wrist 07/2011   right    Surgical History: Past Surgical History:  Procedure Laterality Date  . DORSAL COMPARTMENT RELEASE  08/26/2011   Procedure: RELEASE DORSAL COMPARTMENT (DEQUERVAIN);  Surgeon: Cammie Sickle., MD;  Location[facility];  Service: Orthopedics;  Laterality: Right;  . FOREIGN BODY REMOVAL  08/26/2011   Procedure: FOREIGN BODY REMOVAL ADULT;  Surgeon: Cammie Sickle.,  MD;  Location: [facility];  Service: Orthopedics;  Laterality: Right;    Family History: Family History  Problem Relation Age of Onset  . Hyperlipidemia Mother   . Hypertension Mother   . Hypertension Father   . Stroke Father   . Breast cancer Paternal Grandmother        Age 39's  . Diabetes Mellitus II Paternal Grandmother   . Stroke Paternal Grandfather   . Hypertension Sister   . Heart failure Maternal Grandfather   . Ovarian cancer Paternal Aunt        Age 23/58   . Ovarian cancer Other        Great Maternal Aunt     Social History: Social History   Socioeconomic History  . Marital status: Single    Spouse name: Not on file  . Number of children: Not on file  . Years of education: Not on file  . Highest education level: Not on file  Social Needs  . Financial resource strain: Not on file  . Food insecurity - worry: Not on file  . Food insecurity - inability: Not on file  . Transportation needs - medical: Not on file  . Transportation needs - non-medical: Not on file  Occupational History  . Not on file  Tobacco Use  . Smoking status: Never Smoker  . Smokeless tobacco: Never Used  Substance and Sexual Activity  . Alcohol use: Yes    Alcohol/week: 0.0 oz    Comment: occasionally  . Drug use: No  . Sexual activity: No    Birth control/protection: Pill    Comment: Micronor  Other Topics Concern  . Not on file  Social History Narrative   Work or School: works with senior center      Home Situation: lives alone      Spiritual Beliefs: none, Christian      Lifestyle: active at work but not otherwise; avoiding carbs             Allergies: Allergies  Allergen Reactions  . Oak Bark [[place] trees  . Soap Rash    Medications:  Current Outpatient Medications  Medication Sig Dispense Refill  . acetaminophen (TYLENOL) 650 MG CR tablet Take 650 mg by mouth every 8 (eight) hours as needed for pain.    . Aloe-Sodium Chloride  (AYR SALINE NASAL GEL NA) Place into the nose.    . Cholecalciferol (VITAMIN D3) 400 UNITS CAPS Take by mouth daily.    . cyclobenzaprine (FLEXERIL) 10 MG tablet Take 1 tablet (10 mg total) by mouth 2 (two) times daily as needed for muscle spasms. 20 tablet 0  . diclofenac (VOLTAREN) 50 MG EC tablet Take 1 tablet (50 mg total) by mouth 2 (two) times daily. 15 tablet 0  . fluticasone (FLONASE) 50 MCG/ACT nasal spray Place into both nostrils daily.    Marland Kitchen FOLIC ACID PO Take 494 mg by mouth daily.    Marland Kitchen  IBUPROFEN PO Take by mouth.    . losartan-hydrochlorothiazide (HYZAAR) 50-12.5 MG tablet Take 1.5 tablets by mouth daily. 135 tablet 3  . metFORMIN (GLUCOPHAGE) 1000 MG tablet Take 1 tablet (1,000 mg total) by mouth 2 (two) times daily with a meal. 180 tablet 1  . Misc Natural Products (TURMERIC CURCUMIN) CAPS Take 1 capsule by mouth every other day. 30 capsule 0  . mometasone (ELOCON) 0.1 % cream Apply 1 application topically daily. 45 g 1  . omeprazole (PRILOSEC) 20 MG capsule TAKE ONE CAPSULE BY MOUTH DAILY 90 capsule 1  . ORTHO MICRONOR 0.35 MG tablet Take 1 tablet (0.35 mg total) by mouth daily. 3 Package 0  . triamcinolone cream (KENALOG) 0.1 %     . vitamin B-12 (CYANOCOBALAMIN) 1000 MCG tablet Take 1,000 mcg by mouth daily.     No current facility-administered medications for this visit.     Review of Systems: Review of Systems  All other systems reviewed and are negative.    PHYSICAL EXAMINATION Blood pressure 132/70, pulse 86, temperature 98.4 F (36.9 C), temperature source Oral, resp. rate 18, height '5\' 6"'$  (1.676 m), weight 215 lb 14.4 oz (97.9 kg), SpO2 100 %.  ECOG PERFORMANCE STATUS: 1 - Symptomatic but completely ambulatory  Physical Exam  Constitutional: She is oriented to person, place, and time and well-developed, well-nourished, and in no distress. No distress.  HENT:  Head: Normocephalic and atraumatic.  Mouth/Throat: Oropharynx is clear and moist. No oropharyngeal  exudate.  Eyes: Conjunctivae and EOM are normal. Pupils are equal, round, and reactive to light. No scleral icterus.  Neck: No thyromegaly present.  Cardiovascular: Normal rate, regular rhythm, normal heart sounds and intact distal pulses.  No murmur heard. Pulmonary/Chest: Effort normal and breath sounds normal. No respiratory distress. She has no wheezes. She has no rales.  Abdominal: Soft. Bowel sounds are normal. She exhibits no distension and no mass. There is no tenderness. There is no guarding.  Musculoskeletal: She exhibits no edema.  Lymphadenopathy:    She has no cervical adenopathy.  Neurological: She is alert and oriented to person, place, and time. She has normal reflexes. No cranial nerve deficit.  Skin: Skin is warm and dry. No rash noted. She is not diaphoretic. No erythema. No pallor.     LABORATORY DATA: I have personally reviewed the data as listed: No visits with results within 1 Week(s) from this visit.  Latest known visit with results is:  Appointment on 07/18/2017  Component Date Value Ref Range Status  . Iron 07/18/2017 43  41 - 142 ug/dL Final  . TIBC 07/18/2017 361  236 - 444 ug/dL Final  . Saturation Ratios 07/18/2017 12* 21 - 57 % Final  . UIBC 07/18/2017 318  ug/dL Final   Performed at [facility], [place] [address], [place], [place] [address]  . Ferritin 07/18/2017 26  9 - 269 ng/mL Final   Performed at [facility], [place] [address], [place], [place] [address]  . CRP 07/18/2017 <0.8  <1.0 mg/dL Final   Performed at [place] [address] [place], [place] [address]  . Sed Rate 07/18/2017 7  0 - 22 mm/hr Final   Performed at [facility], [place] [address] [place], [place] [address]  . DAT, complement 07/18/2017 NEG   Final  . DAT, IgG 07/18/2017    Final  Value:NEG Performed at [facility], [place] [address], [place], [place] [address]   .  Haptoglobin 07/18/2017 165  34 - 200 mg/dL Final   Comment: (NOTE) Performed At: [facility] [place], [place] [id number] Rush Farmer MD WC:[id number] Performed at [facility], [place] [address], [place], [place] [address]   . LDH 07/18/2017 185  125 - 245 U/L Final   Performed at [facility], [place] [address], [place], [place] [address]  . Tech Review 07/18/2017 SMEAR CONSISTENT WITH AUTO DIFF, ADEQUATE PLATELETS, RBC MORPHOLOGY WITHIN NORMAL LIMITS   Final   Performed at [facility], [place] [address] [place], [place] [address]  . Sodium 07/18/2017 139  136 - 145 mmol/L Final  . Potassium 07/18/2017 3.4* 3.5 - 5.1 mmol/L Final  . Chloride 07/18/2017 104  98 - 109 mmol/L Final  . CO2 07/18/2017 23  22 - 29 mmol/L Final  . Glucose, Bld 07/18/2017 73  70 - 140 mg/dL Final  . BUN 07/18/2017 17  7 - 26 mg/dL Final  . Creatinine 07/18/2017 0.92  0.60 - 1.10 mg/dL Final  . Calcium 07/18/2017 9.9  8.4 - 10.4 mg/dL Final  . Total Protein 07/18/2017 7.4  6.4 - 8.3 g/dL Final  . Albumin 07/18/2017 4.1  3.5 - 5.0 g/dL Final  . AST 07/18/2017 15  5 - 34 U/L Final  . ALT 07/18/2017 8  0 - 55 U/L Final  . Alkaline Phosphatase 07/18/2017 96  40 - 150 U/L Final  . Total Bilirubin 07/18/2017 0.5  0.2 - 1.2 mg/dL Final  . GFR, Est Non Af Am 07/18/2017 >60  >60 mL/min Final  . GFR, Est AFR Am 07/18/2017 >60  >60 mL/min Final   Comment: (NOTE) The eGFR has been calculated using the CKD EPI equation. This calculation has not been validated in all clinical situations. eGFR's persistently <60 mL/min signify possible Chronic Kidney Disease.   Georgiann Hahn gap 07/18/2017 12* 3 - 11 Final   Performed at [facility], [place] [address], [place], [place] [address]  . WBC Count 07/18/2017 5.5  3.9 - 10.3 K/uL Final  . RBC 07/18/2017 4.34  3.70 - 5.45 MIL/uL Final  . Hemoglobin 07/18/2017 12.0   11.6 - 15.9 g/dL Final  . HCT 07/18/2017 36.9  34.8 - 46.6 % Final  . MCV 07/18/2017 85.0  79.5 - 101.0 fL Final  . MCH 07/18/2017 27.6  25.1 - 34.0 pg Final  . MCHC 07/18/2017 32.5  31.5 - 36.0 g/dL Final  . RDW 07/18/2017 14.3  11.2 - 14.5 % Final  . Platelet Count 07/18/2017 295  145 - 400 K/uL Final  . Neutrophils Relative % 07/18/2017 49  % Final  . Neutro Abs 07/18/2017 2.7  1.5 - 6.5 K/uL Final  . Lymphocytes Relative 07/18/2017 41  % Final  . Lymphs Abs 07/18/2017 2.3  0.9 - 3.3 K/uL Final  . Monocytes Relative 07/18/2017 7  % Final  . Monocytes Absolute 07/18/2017 0.4  0.1 - 0.9 K/uL Final  . Eosinophils Relative 07/18/2017 2  % Final  . Eosinophils Absolute 07/18/2017 0.1  0.0 - 0.5 K/uL Final  . Basophils Relative 07/18/2017 1  % Final  . Basophils Absolute 07/18/2017 0.0  0.0 - 0.1 K/uL Final  . nRBC 07/18/2017 0  0 /100 WBC Final   Performed at [facility], [place] [address], [place], [place] [address]  Ardath Sax, MD

## 2017-08-10 NOTE — Assessment & Plan Note (Signed)
42 y.o. Very mild anemia which appears to be normocytic and normochromic with no evidence of deficiency and folate, B12, or iron.  Repeat lab work demonstrates no current anemia with normal size and coloration of the red blood cells and no abnormalities in the white blood cell count or platelets.  Repeat iron levels are not significantly changed from October 2018 there is no evidence of inflammatory stye or ongoing hemolysis.  Mild anemia observed likely has to do with menstrual period which appears to be well compensated by dietary intake.  Plan: -No additional intervention necessary at this point in time. -Return to clinic in 6 months with repeat lab work.

## 2017-08-15 ENCOUNTER — Ambulatory Visit (INDEPENDENT_AMBULATORY_CARE_PROVIDER_SITE_OTHER): Payer: 59 | Admitting: Obstetrics and Gynecology

## 2017-08-15 ENCOUNTER — Encounter: Payer: Self-pay | Admitting: Obstetrics and Gynecology

## 2017-08-15 ENCOUNTER — Other Ambulatory Visit: Payer: Self-pay

## 2017-08-15 VITALS — BP 138/76 | HR 80 | Resp 16 | Ht 66.0 in | Wt 214.0 lb

## 2017-08-15 DIAGNOSIS — Z01419 Encounter for gynecological examination (general) (routine) without abnormal findings: Secondary | ICD-10-CM | POA: Diagnosis not present

## 2017-08-15 MED ORDER — ORTHO MICRONOR 0.35 MG PO TABS: 1.0000 | ORAL_TABLET | Freq: Every day | ORAL | 3 refills | Status: DC

## 2017-08-15 NOTE — Patient Instructions (Signed)

## 2017-08-15 NOTE — Progress Notes (Signed)
42 y.o. G0P0000 Single African American female here for annual exam.    Started Micronor for pelvic cramping after having unremarkable pelvic US. Cramping has improved on POPs.  No heavy clotting but is having more red bleeding.  Taking on time.    Saw oncology and had a normal evaluation.  No concern for immediate anemia.  Will do a recheck in 6 months.  PCP:   Dr. Colin Benton  Patient's last menstrual period was 08/12/2017.           Sexually active: No.  The current method of family planning is oral progesterone-only contraceptive.    Exercising: Yes.    The patient has a physically strenuous job, but has no regular exercise apart from work.  occasional yoga Smoker:  no  Health Maintenance: Pap:  12/09/15, Negative with neg HR HPV.  Pap and HR HPV both negative 05/21/14. History of abnormal Pap:  No.  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  FU normal.  MMG:  08/03/16 BIRADS 1 negative/density c TDaP:  03/19/13 Gardasil:   no HIV: 05/21/14 Negative Hep C: never Screening Labs:  PCP and Oncology   reports that  has never smoked. she has never used smokeless tobacco. She reports that she drinks alcohol. She reports that she does not use drugs.  Past Medical History:  Diagnosis Date  . Abnormal Pap smear of cervix - hpv per report in 2014, seeing gyn 11/16/2013   normal pap with gyn 2015  . Arthritis    hand  . Diabetes mellitus without complication ([place])   . Foreign body of hand, right 07/2011   right MP  . GERD (gastroesophageal reflux disease)   . Headache(784.0)    sinus  . Hypertension    denies HTN, but takes Hyzaar   . Stenosing tenosynovitis of wrist 07/2011   right    Past Surgical History:  Procedure Laterality Date  . DORSAL COMPARTMENT RELEASE  08/26/2011   Procedure: RELEASE DORSAL COMPARTMENT (DEQUERVAIN);  Surgeon: Cammie Sickle., MD;  [facility];  Service: Orthopedics;  Laterality: Right;  . FOREIGN BODY REMOVAL  08/26/2011   Procedure:  FOREIGN BODY REMOVAL ADULT;  Surgeon: Cammie Sickle., MD;  Location: [facility];  Service: Orthopedics;  Laterality: Right;    Current Outpatient Medications  Medication Sig Dispense Refill  . acetaminophen (TYLENOL) 650 MG CR tablet Take 650 mg by mouth every 8 (eight) hours as needed for pain.    . Aloe-Sodium Chloride (AYR SALINE NASAL GEL NA) Place into the nose.    . Cholecalciferol (VITAMIN D3) 400 UNITS CAPS Take by mouth daily.    . fluticasone (FLONASE) 50 MCG/ACT nasal spray Place into both nostrils daily.    Marland Kitchen FOLIC ACID PO Take 664 mg by mouth daily.    . IBUPROFEN PO Take by mouth.    . losartan-hydrochlorothiazide (HYZAAR) 50-12.5 MG tablet Take 1.5 tablets by mouth daily. 135 tablet 3  . metFORMIN (GLUCOPHAGE) 1000 MG tablet Take 1 tablet (1,000 mg total) by mouth 2 (two) times daily with a meal. 180 tablet 1  . Misc Natural Products (TURMERIC CURCUMIN) CAPS Take 1 capsule by mouth every other day. 30 capsule 0  . mometasone (ELOCON) 0.1 % cream Apply 1 application topically daily. 45 g 1  . omeprazole (PRILOSEC) 20 MG capsule TAKE ONE CAPSULE BY MOUTH DAILY 90 capsule 1  . ORTHO MICRONOR 0.35 MG tablet Take 1 tablet (0.35 mg total) by mouth  daily. 3 Package 0  . triamcinolone cream (KENALOG) 0.1 %     . vitamin B-12 (CYANOCOBALAMIN) 1000 MCG tablet Take 1,000 mcg by mouth daily.     No current facility-administered medications for this visit.     Family History  Problem Relation Age of Onset  . Hyperlipidemia Mother   . Hypertension Mother   . Hypertension Father   . Stroke Father   . Breast cancer Paternal Grandmother        Age 17's  . Diabetes Mellitus II Paternal Grandmother   . Stroke Paternal Grandfather   . Hypertension Sister   . Heart failure Maternal Grandfather   . Ovarian cancer Paternal Aunt        Age 39/58   . Ovarian cancer Other        Great Maternal Aunt     ROS:  Pertinent items are noted in HPI.  Otherwise, a  comprehensive ROS was negative.  Exam:   BP 138/76 (BP Location: Right Arm, Patient Position: Sitting, Cuff Size: Large)   Pulse 80   Resp 16   Ht 5\' 6"  (1.676 m)   Wt 214 lb (97.1 kg)   LMP 08/12/2017   BMI 34.54 kg/m     General appearance: alert, cooperative and appears stated age Head: Normocephalic, without obvious abnormality, atraumatic Neck: no adenopathy, supple, symmetrical, trachea midline and thyroid normal to inspection and palpation Lungs: clear to auscultation bilaterally Breasts: normal appearance, no masses or tenderness, No nipple retraction or dimpling, No nipple discharge or bleeding, No axillary or supraclavicular adenopathy Heart: regular rate and rhythm Abdomen: soft, non-tender; no masses, no organomegaly Extremities: extremities normal, atraumatic, no cyanosis or edema Skin: Skin color, texture, turgor normal. No rashes or lesions Lymph nodes: Cervical, supraclavicular, and axillary nodes normal. No abnormal inguinal nodes palpated Neurologic: Grossly normal  Pelvic: External genitalia:  no lesions              Urethra:  normal appearing urethra with no masses, tenderness or lesions              Bartholins and Skenes: normal                 Vagina: normal appearing vagina with normal color and discharge, no lesions              Cervix: no lesions.  Menstrual flow - light.               Pap taken: No. Bimanual Exam:  Uterus:  normal size, contour, position, consistency, mobility, non-tender              Adnexa: no mass, fullness, tenderness              Rectal exam: Yes.  .  Confirms.              Anus:  normal sphincter tone, no lesions  Chaperone was present for exam.  Assessment:   Well woman visit with normal exam. Pelvic cramping treated with Micronor.  Doing well. Hx HPV.  Follow up paps normal and negative.   Plan: Mammogram screening discussed. Recommended self breast awareness. Pap and HR HPV as above. Guidelines for Calcium, Vitamin D,  regular exercise program including cardiovascular and weight bearing exercise. Discussed Gardasil.  She will consider.  Refill of Micronor for one year.  Labs with PCP and oncology.  Follow up annually and prn.   After visit summary provided.

## 2017-08-19 ENCOUNTER — Ambulatory Visit: Payer: 59 | Admitting: Nurse Practitioner

## 2017-08-27 ENCOUNTER — Other Ambulatory Visit: Payer: Self-pay | Admitting: Family Medicine

## 2017-09-12 ENCOUNTER — Telehealth: Payer: Self-pay | Admitting: Obstetrics and Gynecology

## 2017-09-12 ENCOUNTER — Other Ambulatory Visit: Payer: Self-pay | Admitting: Family Medicine

## 2017-09-12 DIAGNOSIS — Z1231 Encounter for screening mammogram for malignant neoplasm of breast: Secondary | ICD-10-CM

## 2017-09-12 NOTE — Telephone Encounter (Signed)
Call returned, no answer, mailbox full, unable to leave message.

## 2017-09-12 NOTE — Telephone Encounter (Signed)
Spoke with patient, calling to confirm how POP RX was sent to pharmacy. Patient states she went to pick up Ortho Micronor, was filled as [facility]. Patient did not pick up RX. Advised patient sent as Ortho Micronor, DAW. Patient states she has f/u with pharmacy and is unclear why RX filled as [facility]. Advised RN will call pharmacy and return call.

## 2017-09-12 NOTE — Telephone Encounter (Signed)
Spoke with Santiago Glad at [facility]. Was advised Ortho Micronor not in stock, will have to ordered, 3 mo supply $150. Patient needs to return call to advise if Ortho Micronor to be ordered, if not and chooses generic, will need new order.

## 2017-09-12 NOTE — Telephone Encounter (Signed)
Patient calling to speak with nurse about her birth control prescription.

## 2017-09-13 ENCOUNTER — Ambulatory Visit
Admission: RE | Admit: 2017-09-13 | Discharge: 2017-09-13 | Disposition: A | Discharge status: Home / Self Care | Payer: 59 | Source: Ambulatory Visit | Attending: Family Medicine | Admitting: Family Medicine

## 2017-09-13 DIAGNOSIS — Z1231 Encounter for screening mammogram for malignant neoplasm of breast: Secondary | ICD-10-CM

## 2017-09-13 NOTE — Telephone Encounter (Signed)
Call returned, no answer, mailbox full, unable to leave message.

## 2017-09-13 NOTE — Telephone Encounter (Signed)
Spoke with patient, Advised as seen below per pharmacy. Patient request brand only, is aware of cost. Patient aware she will need to contact pharmacy directly to authorize ordering for [facility]. Patient verbalizes understanding and is agreeable.   Routing to provider for final review. Patient is agreeable to disposition. Will close encounter.

## 2017-10-05 NOTE — Progress Notes (Signed)
HPI:  Using dictation device. Unfortunately this device frequently misinterprets words/phrases.  Linda Conrad is a pleasant 42 y.o. here for follow up. Chronic medical problems summarized below were reviewed for changes.  She got a letter from her pharmacy about the recall of her combo blood pressure medicine.  This really scared her, so she stopped taking all of her medicines.  She has not been taking any of her medicines for about 1 month. She has felt okay, except for she has had a lot of allergy symptoms.  She has had some nasal congestion, postnasal drip, cough and sinus drainage for about 1 week.  She is taking Flonase 1 spray each nostril daily.  She is not taking an antihistamine, they usually make her too tired. Denies CP, SOB, DOE, treatment intolerance or new symptoms. Due for hgba1c, bmp  Diabetes mellitus: -meds: metformin 1000bid, arb -no known complications -wants to eventually stop medications - prefers not to take medications, not on statin or asa  HTN/Obesity: -meds: losartan-hctz  GERD: -meds: prilosecas needed -stable  Anemia: -sees gyn for heavy menstrual bleeding -stool cards and anemia panel normal -she saw hematologist 07/2017 given her anxiety regarding this - per note felt related to menstrual cycles  ROS: See pertinent positives and negatives per HPI.  Past Medical History:  Diagnosis Date  . Abnormal Pap smear of cervix - hpv per report in 2014, seeing gyn 11/16/2013   normal pap with gyn 2015  . Arthritis    hand  . Diabetes mellitus without complication ([place])   . Foreign body of hand, right 07/2011   right MP  . GERD (gastroesophageal reflux disease)   . Headache(784.0)    sinus  . Hypertension    denies HTN, but takes Hyzaar   . Stenosing tenosynovitis of wrist 07/2011   right    Past Surgical History:  Procedure Laterality Date  . DORSAL COMPARTMENT RELEASE  08/26/2011   Procedure: RELEASE DORSAL COMPARTMENT (DEQUERVAIN);   Surgeon: Cammie Sickle., MD;  Location: [facility];  Service: Orthopedics;  Laterality: Right;  . FOREIGN BODY REMOVAL  08/26/2011   Procedure: FOREIGN BODY REMOVAL ADULT;  Surgeon: Cammie Sickle., MD;  Location: [facility];  Service: Orthopedics;  Laterality: Right;    Family History  Problem Relation Age of Onset  . Hyperlipidemia Mother   . Hypertension Mother   . Hypertension Father   . Stroke Father   . Breast cancer Paternal Grandmother        Age 7's  . Diabetes Mellitus II Paternal Grandmother   . Stroke Paternal Grandfather   . Hypertension Sister   . Heart failure Maternal Grandfather   . Ovarian cancer Paternal Aunt        Age 65/58   . Ovarian cancer Other        Great Maternal Aunt     SOCIAL HX: see hpi   Current Outpatient Medications:  .  acetaminophen (TYLENOL) 650 MG CR tablet, Take 650 mg by mouth every 8 (eight) hours as needed for pain., Disp: , Rfl:  .  Aloe-Sodium Chloride (AYR SALINE NASAL GEL NA), Place into the nose., Disp: , Rfl:  .  Cholecalciferol (VITAMIN D3) 400 UNITS CAPS, Take by mouth daily., Disp: , Rfl:  .  fluticasone (FLONASE) 50 MCG/ACT nasal spray, Place into both nostrils daily., Disp: , Rfl:  .  FOLIC ACID PO, Take 664 mg by mouth daily., Disp: , Rfl:  .  IBUPROFEN  PO, Take by mouth., Disp: , Rfl:  .  metFORMIN (GLUCOPHAGE) 1000 MG tablet, Take 1 tablet (1,000 mg total) by mouth 2 (two) times daily with a meal., Disp: 180 tablet, Rfl: 1 .  Misc Natural Products (TURMERIC CURCUMIN) CAPS, Take 1 capsule by mouth every other day., Disp: 30 capsule, Rfl: 0 .  mometasone (ELOCON) 0.1 % cream, Apply 1 application topically daily., Disp: 45 g, Rfl: 1 .  omeprazole (PRILOSEC) 20 MG capsule, TAKE ONE CAPSULE BY MOUTH DAILY, Disp: 90 capsule, Rfl: 1 .  ORTHO MICRONOR 0.35 MG tablet, Take 1 tablet (0.35 mg total) by mouth daily., Disp: 3 Package, Rfl: 3 .  triamcinolone cream (KENALOG) 0.1 %, , Disp: , Rfl:   .  vitamin B-12 (CYANOCOBALAMIN) 1000 MCG tablet, Take 1,000 mcg by mouth daily., Disp: , Rfl:  .  hydrochlorothiazide (HYDRODIURIL) 25 MG tablet, Take 1 tablet (25 mg total) by mouth daily., Disp: 90 tablet, Rfl: 3 .  losartan (COZAAR) 50 MG tablet, Take 1 tablet (50 mg total) by mouth daily., Disp: 90 tablet, Rfl: 3  EXAM:  Vitals:   10/06/17 0939  BP: 130/90  Pulse: 77  Temp: 98.3 F (36.8 C)  SpO2: 99%    Body mass index is 34.61 kg/m.  GENERAL: vitals reviewed and listed above, alert, oriented, appears well hydrated and in no acute distress  HEENT: atraumatic, conjunttiva clear, no obvious abnormalities on inspection of external nose and ears, normal appearance of ear canals and TMs, clear nasal congestion, mild post oropharyngeal erythema with PND, no tonsillar edema or exudate, no sinus TTP  NECK: no obvious masses on inspection  LUNGS: clear to auscultation bilaterally, no wheezes, rales or rhonchi, good air movement  CV: HRRR, no peripheral edema  MS: moves all extremities without noticeable abnormality  PSYCH: pleasant and cooperative, no obvious depression or anxiety  ASSESSMENT AND PLAN:  Discussed the following assessment and plan:  Type 2 diabetes mellitus without complication, without long-term current use of insulin (HCC) - Plan: Hemoglobin A1c  Hypertension associated with diabetes ([place]) - Plan: Basic metabolic panel  Obesity (BMI 30.0-34.9)  Allergic rhinitis, unspecified seasonality, unspecified trigger  -Labs per orders -Discussed recall and advised to check with the pharmacy, we will split out the combination medication, also advised her of other pharmacy options, advised that she always contact us if she has any concerns regarding her medication before stopping -Lifestyle recommendations - Add half dose of antihistamine for the allergies -Follow-up 1 month, sooner if needed   Patient Instructions  BEFORE YOU LEAVE: -Labs -follow up: 1 month   for blood pressure  Restart your medications with the following exceptions:  Please stop the combination blood pressure medicine - . Hydrochlorothiazide-losartan  Please start hydrochlorothiazide 25 mg daily.  Please start losartan 50 mg daily.  Check with the pharmacy in terms of recall.     Lucretia Kern, DO

## 2017-10-06 ENCOUNTER — Encounter: Payer: Self-pay | Admitting: Family Medicine

## 2017-10-06 ENCOUNTER — Ambulatory Visit (INDEPENDENT_AMBULATORY_CARE_PROVIDER_SITE_OTHER): Payer: 59 | Admitting: Family Medicine

## 2017-10-06 VITALS — BP 130/90 | HR 77 | Temp 98.3°F | Ht 66.0 in | Wt 214.4 lb

## 2017-10-06 DIAGNOSIS — J309 Allergic rhinitis, unspecified: Secondary | ICD-10-CM | POA: Diagnosis not present

## 2017-10-06 DIAGNOSIS — I10 Essential (primary) hypertension: Secondary | ICD-10-CM | POA: Diagnosis not present

## 2017-10-06 DIAGNOSIS — E669 Obesity, unspecified: Secondary | ICD-10-CM

## 2017-10-06 DIAGNOSIS — E119 Type 2 diabetes mellitus without complications: Secondary | ICD-10-CM | POA: Diagnosis not present

## 2017-10-06 DIAGNOSIS — I152 Hypertension secondary to endocrine disorders: Secondary | ICD-10-CM

## 2017-10-06 DIAGNOSIS — E1159 Type 2 diabetes mellitus with other circulatory complications: Secondary | ICD-10-CM | POA: Diagnosis not present

## 2017-10-06 LAB — BASIC METABOLIC PANEL
BUN: 11 mg/dL (ref 6–23)
CALCIUM: 9.1 mg/dL (ref 8.4–10.5)
CHLORIDE: 105 meq/L (ref 96–112)
CO2: 27 meq/L (ref 19–32)
CREATININE: 0.8 mg/dL (ref 0.40–1.20)
GFR: 100.6 mL/min (ref >60.00)
Glucose, Bld: 95 mg/dL (ref 70–99)
Potassium: 4.2 mEq/L (ref 3.5–5.1)
Sodium: 138 mEq/L (ref 135–145)

## 2017-10-06 LAB — HEMOGLOBIN A1C: Hgb A1c MFr Bld: 6 % (ref 4.6–6.5)

## 2017-10-06 MED ORDER — HYDROCHLOROTHIAZIDE 25 MG PO TABS: 25.0000 mg | ORAL_TABLET | Freq: Every day | ORAL | 3 refills | Status: DC

## 2017-10-06 MED ORDER — LOSARTAN POTASSIUM 50 MG PO TABS: 50.0000 mg | ORAL_TABLET | Freq: Every day | ORAL | 3 refills | Status: DC

## 2017-10-06 NOTE — Patient Instructions (Addendum)
BEFORE YOU LEAVE: -Labs -follow up: 1 month  for blood pressure  Restart your medications with the following exceptions:  Please stop the combination blood pressure medicine - . Hydrochlorothiazide-losartan  Please start hydrochlorothiazide 25 mg daily.  Please start losartan 50 mg daily.  Check with the pharmacy in terms of recall.

## 2017-10-10 ENCOUNTER — Other Ambulatory Visit: Payer: Self-pay | Admitting: *Deleted

## 2017-10-10 MED ORDER — LOSARTAN POTASSIUM 50 MG PO TABS: 50.0000 mg | ORAL_TABLET | Freq: Every day | ORAL | 1 refills | Status: DC

## 2017-10-10 MED ORDER — HYDROCHLOROTHIAZIDE 25 MG PO TABS: 25.0000 mg | ORAL_TABLET | Freq: Every day | ORAL | 1 refills | Status: DC

## 2017-10-10 MED ORDER — METFORMIN HCL 1000 MG PO TABS: 1000.0000 mg | ORAL_TABLET | Freq: Two times a day (BID) | ORAL | 1 refills | Status: DC

## 2017-10-10 NOTE — Addendum Note (Signed)
Addended by: Lahoma Crocker A on: 10/10/2017 03:00 PM   Modules accepted: Orders

## 2017-10-10 NOTE — Progress Notes (Signed)
Opened in error

## 2017-11-06 NOTE — Progress Notes (Deleted)
HPI:  Using dictation device. Unfortunately this device frequently misinterprets words/phrases.  At her last visit she had stopped taking all her medications due to recall letter she received from her pharmacy. Her blood pressure was running a little high and she restarted her BP medications. We also added a half dose of antihistamine for her allergies.Today reports. Denies.  PMH: Diabetes mellitus: -meds: metformin 1000bid, arb -no known complications -wants to eventually stop medications- prefers not to take medications, not on statin or asa  HTN/Obesity: -meds: losartan-hctz  GERD: -meds: prilosecas needed -stable  Anemia: -sees gyn for heavy menstrual bleeding -stool cards and anemia panel normal -she saw hematologist 07/2017 given her anxiety regarding this - per note felt related to menstrual cycles    ROS: See pertinent positives and negatives per HPI.  Past Medical History:  Diagnosis Date  . Abnormal Pap smear of cervix - hpv per report in 2014, seeing gyn 11/16/2013   normal pap with gyn 2015  . Arthritis    hand  . Diabetes mellitus without complication ([place])   . Foreign body of hand, right 07/2011   right MP  . GERD (gastroesophageal reflux disease)   . Headache(784.0)    sinus  . Hypertension    denies HTN, but takes Hyzaar   . Stenosing tenosynovitis of wrist 07/2011   right    Past Surgical History:  Procedure Laterality Date  . DORSAL COMPARTMENT RELEASE  08/26/2011   Procedure: RELEASE DORSAL COMPARTMENT (DEQUERVAIN);  Surgeon: Cammie Sickle., MD;  Location: [facility];  Service: Orthopedics;  Laterality: Right;  . FOREIGN BODY REMOVAL  08/26/2011   Procedure: FOREIGN BODY REMOVAL ADULT;  Surgeon: Cammie Sickle., MD;  Location: [facility];  Service: Orthopedics;  Laterality: Right;    Family History  Problem Relation Age of Onset  . Hyperlipidemia Mother   . Hypertension Mother   . Hypertension  Father   . Stroke Father   . Breast cancer Paternal Grandmother        Age 11's  . Diabetes Mellitus II Paternal Grandmother   . Stroke Paternal Grandfather   . Hypertension Sister   . Heart failure Maternal Grandfather   . Ovarian cancer Paternal Aunt        Age 64/58   . Ovarian cancer Other        Great Maternal Aunt     SOCIAL HX: ***   Current Outpatient Medications:  .  acetaminophen (TYLENOL) 650 MG CR tablet, Take 650 mg by mouth every 8 (eight) hours as needed for pain., Disp: , Rfl:  .  Aloe-Sodium Chloride (AYR SALINE NASAL GEL NA), Place into the nose., Disp: , Rfl:  .  Cholecalciferol (VITAMIN D3) 400 UNITS CAPS, Take by mouth daily., Disp: , Rfl:  .  fluticasone (FLONASE) 50 MCG/ACT nasal spray, Place into both nostrils daily., Disp: , Rfl:  .  FOLIC ACID PO, Take 539 mg by mouth daily., Disp: , Rfl:  .  hydrochlorothiazide (HYDRODIURIL) 25 MG tablet, Take 1 tablet (25 mg total) by mouth daily., Disp: 90 tablet, Rfl: 1 .  IBUPROFEN PO, Take by mouth., Disp: , Rfl:  .  losartan (COZAAR) 50 MG tablet, Take 1 tablet (50 mg total) by mouth daily., Disp: 90 tablet, Rfl: 1 .  metFORMIN (GLUCOPHAGE) 1000 MG tablet, Take 1 tablet (1,000 mg total) by mouth 2 (two) times daily with a meal., Disp: 180 tablet, Rfl: 1 .  Misc Natural Products (TURMERIC CURCUMIN)  CAPS, Take 1 capsule by mouth every other day., Disp: 30 capsule, Rfl: 0 .  mometasone (ELOCON) 0.1 % cream, Apply 1 application topically daily., Disp: 45 g, Rfl: 1 .  omeprazole (PRILOSEC) 20 MG capsule, TAKE ONE CAPSULE BY MOUTH DAILY, Disp: 90 capsule, Rfl: 1 .  ORTHO MICRONOR 0.35 MG tablet, Take 1 tablet (0.35 mg total) by mouth daily., Disp: 3 Package, Rfl: 3 .  triamcinolone cream (KENALOG) 0.1 %, , Disp: , Rfl:  .  vitamin B-12 (CYANOCOBALAMIN) 1000 MCG tablet, Take 1,000 mcg by mouth daily., Disp: , Rfl:   EXAM:  There were no vitals filed for this visit.  There is no height or weight on file to calculate  BMI.  GENERAL: vitals reviewed and listed above, alert, oriented, appears well hydrated and in no acute distress  HEENT: atraumatic, conjunttiva clear, no obvious abnormalities on inspection of external nose and ears  NECK: no obvious masses on inspection  LUNGS: clear to auscultation bilaterally, no wheezes, rales or rhonchi, good air movement  CV: HRRR, no peripheral edema  MS: moves all extremities without noticeable abnormality *** PSYCH: pleasant and cooperative, no obvious depression or anxiety  ASSESSMENT AND PLAN:  Discussed the following assessment and plan:  No diagnosis found.  *** -Patient advised to return or notify a doctor immediately if symptoms worsen or persist or new concerns arise.  There are no Patient Instructions on file for this visit.  Lucretia Kern, DO

## 2017-11-07 ENCOUNTER — Ambulatory Visit: Payer: 59 | Admitting: Family Medicine

## 2017-11-07 DIAGNOSIS — Z0289 Encounter for other administrative examinations: Secondary | ICD-10-CM

## 2017-11-29 DIAGNOSIS — M25562 Pain in left knee: Secondary | ICD-10-CM | POA: Diagnosis not present

## 2017-12-20 NOTE — Progress Notes (Signed)
HPI:  Using dictation device. Unfortunately this device frequently misinterprets words/phrases.  Follow up:  New concern of R ear pain: -x1 week or more -also thick yellow nasal congestion -hx ETD and will see ENT in follow up tomorrow -denies fevers, malaise, drainage from ear -has sore on nose x1 week and using mupirocin from ENT but has not started the flonase they advised  HTN: -had stopped medications on her own at last visit -restarted -no walking some -no CP, SOb, DOE  Allergies: -had advised half dose zyrtec -taking OTC musinex -no using flonase -chronic nasal congestion and etd dysfunction -seeing ENT  ROS: See pertinent positives and negatives per HPI.  Past Medical History:  Diagnosis Date  . Abnormal Pap smear of cervix - hpv per report in 2014, seeing gyn 11/16/2013   normal pap with gyn 2015  . Arthritis    hand  . Diabetes mellitus without complication ([place])   . Foreign body of hand, right 07/2011   right MP  . GERD (gastroesophageal reflux disease)   . Headache(784.0)    sinus  . Hypertension    denies HTN, but takes Hyzaar   . Stenosing tenosynovitis of wrist 07/2011   right    Past Surgical History:  Procedure Laterality Date  . DORSAL COMPARTMENT RELEASE  08/26/2011   Procedure: RELEASE DORSAL COMPARTMENT (DEQUERVAIN);  Surgeon: Cammie Sickle., MD;  Location: [facility];  Service: Orthopedics;  Laterality: Right;  . FOREIGN BODY REMOVAL  08/26/2011   Procedure: FOREIGN BODY REMOVAL ADULT;  Surgeon: Cammie Sickle., MD;  Location: [facility];  Service: Orthopedics;  Laterality: Right;    Family History  Problem Relation Age of Onset  . Hyperlipidemia Mother   . Hypertension Mother   . Hypertension Father   . Stroke Father   . Breast cancer Paternal Grandmother        Age 45's  . Diabetes Mellitus II Paternal Grandmother   . Stroke Paternal Grandfather   . Hypertension Sister   . Heart failure  Maternal Grandfather   . Ovarian cancer Paternal Aunt        Age 75/58   . Ovarian cancer Other        Great Maternal Aunt     SOCIAL HX: see hpi   Current Outpatient Medications:  .  acetaminophen (TYLENOL) 650 MG CR tablet, Take 650 mg by mouth every 8 (eight) hours as needed for pain., Disp: , Rfl:  .  Ascorbic Acid (VITAMIN C PO), Take by mouth daily., Disp: , Rfl:  .  cholecalciferol (VITAMIN D) 1000 units tablet, Take 1,000 Units by mouth daily., Disp: , Rfl:  .  fluticasone (FLONASE) 50 MCG/ACT nasal spray, Place into both nostrils daily., Disp: , Rfl:  .  FOLIC ACID PO, Take 527 mg by mouth daily., Disp: , Rfl:  .  hydrochlorothiazide (HYDRODIURIL) 25 MG tablet, Take 1 tablet (25 mg total) by mouth daily., Disp: 90 tablet, Rfl: 1 .  IBUPROFEN PO, Take by mouth., Disp: , Rfl:  .  losartan (COZAAR) 50 MG tablet, Take 1 tablet (50 mg total) by mouth daily., Disp: 90 tablet, Rfl: 1 .  Meloxicam 15 MG TBDP, Take by mouth daily as needed., Disp: , Rfl:  .  metFORMIN (GLUCOPHAGE) 1000 MG tablet, Take 1 tablet (1,000 mg total) by mouth 2 (two) times daily with a meal., Disp: 180 tablet, Rfl: 1 .  mometasone (ELOCON) 0.1 % cream, Apply 1 application topically daily., Disp:  45 g, Rfl: 1 .  omeprazole (PRILOSEC) 20 MG capsule, TAKE ONE CAPSULE BY MOUTH DAILY, Disp: 90 capsule, Rfl: 1 .  ORTHO MICRONOR 0.35 MG tablet, Take 1 tablet (0.35 mg total) by mouth daily., Disp: 3 Package, Rfl: 3 .  triamcinolone cream (KENALOG) 0.1 %, , Disp: , Rfl:   EXAM:  Vitals:   12/22/17 0820  BP: 118/74  Pulse: 99  Temp: 99 F (37.2 C)  SpO2: 99%    Body mass index is 33.28 kg/m.  GENERAL: vitals reviewed and listed above, alert, oriented, appears well hydrated and in no acute distress  HEENT: atraumatic, conjunttiva clear, no obvious abnormalities on inspection of external nose and ears, normal appearance of ear canals and TMs except for purulent effusion R ME with bulging/dull  TM, thick nasal  congestion, mild post oropharyngeal erythema with PND, no tonsillar edema or exudate, no sinus TTP  SKIN: erythematous irritated skin opening of R nostril  NECK: no obvious masses on inspection  LUNGS: clear to auscultation bilaterally, no wheezes, rales or rhonchi, good air movement  CV: HRRR, no peripheral edema  MS: moves all extremities without noticeable abnormality  PSYCH: pleasant and cooperative, no obvious depression or anxiety  ASSESSMENT AND PLAN:  Discussed the following assessment and plan:  Non-recurrent acute suppurative otitis media of right ear without spontaneous rupture of tympanic membrane  Rhinitis, unspecified type  Non-healing skin lesion of nose  Essential hypertension  -discussed options for the tx of AOM - she opted for augmentin -she sees ENT tomorrow -aquaphor for nasal lesion and ENT f/u advised if persists/non-healing -lifestyle recs, cont BP meds -flonase -Patient advised to return or notify a doctor immediately if symptoms worsen or persist or new concerns arise.  Patient Instructions  BEFORE YOU LEAVE: -follow up: 3 months  Start the antibiotic for the ear infection and see your ear, nose and throat doctor as planned  Aquaphor to the lesion on the nose twice daily and follow up with ENT if persists  Continue blood pressure medications, healthy diet and walking  flonase  I hope you are feeling better soon! Follow up sooner if your symptoms worsen, new concerns arise or you are not improving with treatment.      Lucretia Kern, DO

## 2017-12-22 ENCOUNTER — Ambulatory Visit: Payer: 59 | Admitting: Family Medicine

## 2017-12-22 ENCOUNTER — Encounter: Payer: Self-pay | Admitting: Family Medicine

## 2017-12-22 VITALS — BP 118/74 | HR 99 | Temp 99.0°F | Ht 66.0 in | Wt 206.2 lb

## 2017-12-22 DIAGNOSIS — H66001 Acute suppurative otitis media without spontaneous rupture of ear drum, right ear: Secondary | ICD-10-CM

## 2017-12-22 DIAGNOSIS — I10 Essential (primary) hypertension: Secondary | ICD-10-CM | POA: Diagnosis not present

## 2017-12-22 DIAGNOSIS — L989 Disorder of the skin and subcutaneous tissue, unspecified: Secondary | ICD-10-CM

## 2017-12-22 DIAGNOSIS — J310 Chronic rhinitis: Secondary | ICD-10-CM

## 2017-12-22 MED ORDER — AMOXICILLIN-POT CLAVULANATE 875-125 MG PO TABS: 1.0000 | ORAL_TABLET | Freq: Two times a day (BID) | ORAL | 0 refills | Status: DC

## 2017-12-22 NOTE — Patient Instructions (Signed)
BEFORE YOU LEAVE: -follow up: 3 months  Start the antibiotic for the ear infection and see your ear, nose and throat doctor as planned  Aquaphor to the lesion on the nose twice daily and follow up with ENT if persists  Continue blood pressure medications, healthy diet and walking  flonase  I hope you are feeling better soon! Follow up sooner if your symptoms worsen, new concerns arise or you are not improving with treatment.

## 2017-12-22 NOTE — Addendum Note (Signed)
Addended by: Agnes Lawrence on: 12/22/2017 08:49 AM   Modules accepted: Orders

## 2017-12-23 DIAGNOSIS — H66001 Acute suppurative otitis media without spontaneous rupture of ear drum, right ear: Secondary | ICD-10-CM | POA: Diagnosis not present

## 2017-12-23 DIAGNOSIS — H900 Conductive hearing loss, bilateral: Secondary | ICD-10-CM | POA: Diagnosis not present

## 2017-12-23 DIAGNOSIS — H6983 Other specified disorders of Eustachian tube, bilateral: Secondary | ICD-10-CM | POA: Diagnosis not present

## 2018-01-03 DIAGNOSIS — J3489 Other specified disorders of nose and nasal sinuses: Secondary | ICD-10-CM | POA: Diagnosis not present

## 2018-01-03 DIAGNOSIS — H66001 Acute suppurative otitis media without spontaneous rupture of ear drum, right ear: Secondary | ICD-10-CM | POA: Diagnosis not present

## 2018-01-03 DIAGNOSIS — H6983 Other specified disorders of Eustachian tube, bilateral: Secondary | ICD-10-CM | POA: Diagnosis not present

## 2018-01-05 ENCOUNTER — Ambulatory Visit: Payer: 59 | Admitting: Family Medicine

## 2018-01-05 ENCOUNTER — Ambulatory Visit (INDEPENDENT_AMBULATORY_CARE_PROVIDER_SITE_OTHER): Payer: 59

## 2018-01-05 ENCOUNTER — Encounter: Payer: Self-pay | Admitting: Family Medicine

## 2018-01-05 VITALS — BP 124/80 | HR 78 | Temp 98.3°F | Ht 66.0 in | Wt 208.8 lb

## 2018-01-05 DIAGNOSIS — H938X9 Other specified disorders of ear, unspecified ear: Secondary | ICD-10-CM

## 2018-01-05 DIAGNOSIS — E1159 Type 2 diabetes mellitus with other circulatory complications: Secondary | ICD-10-CM | POA: Diagnosis not present

## 2018-01-05 DIAGNOSIS — R059 Cough, unspecified: Secondary | ICD-10-CM

## 2018-01-05 DIAGNOSIS — R05 Cough: Secondary | ICD-10-CM

## 2018-01-05 DIAGNOSIS — R229 Localized swelling, mass and lump, unspecified: Secondary | ICD-10-CM

## 2018-01-05 DIAGNOSIS — IMO0002 Reserved for concepts with insufficient information to code with codable children: Secondary | ICD-10-CM

## 2018-01-05 DIAGNOSIS — I152 Hypertension secondary to endocrine disorders: Secondary | ICD-10-CM

## 2018-01-05 DIAGNOSIS — I10 Essential (primary) hypertension: Secondary | ICD-10-CM

## 2018-01-05 NOTE — Progress Notes (Signed)
HPI:  Using dictation device. Unfortunately this device frequently misinterprets words/phrases.  Linda Conrad is here for follow-up.  She saw the ear nose and throat specialist about her ear and sinus issues.  She is doing better overall, but still has some fullness in the ear and a cough. She has had the cough for about 4 weeks. The cough is not bad, but she has it several times a day and she is worried she could have walking pneumonia.  Denies fevers, thick sputum or shortness of breath.   Her blood pressure is a little up this morning, but she reports this is because she did not take her medications this morning. No cp or sob.  Also, when she was putting her lotion on this morning she noticed a lump on the left hip that she thought was different from the right side.  She wants me to check this out.  No pain.  ROS: See pertinent positives and negatives per HPI.  Past Medical History:  Diagnosis Date  . Abnormal Pap smear of cervix - hpv per report in 2014, seeing gyn 11/16/2013   normal pap with gyn 2015  . Arthritis    hand  . Diabetes mellitus without complication ([place])   . Foreign body of hand, right 07/2011   right MP  . GERD (gastroesophageal reflux disease)   . Headache(784.0)    sinus  . Hypertension    denies HTN, but takes Hyzaar   . Stenosing tenosynovitis of wrist 07/2011   right    Past Surgical History:  Procedure Laterality Date  . DORSAL COMPARTMENT RELEASE  08/26/2011   Procedure: RELEASE DORSAL COMPARTMENT (DEQUERVAIN);  Surgeon: Cammie Sickle., MD;  Location: [facility];  Service: Orthopedics;  Laterality: Right;  . FOREIGN BODY REMOVAL  08/26/2011   Procedure: FOREIGN BODY REMOVAL ADULT;  Surgeon: Cammie Sickle., MD;  Location: [place] [facility];  Service: Orthopedics;  Laterality: Right;    Family History  Problem Relation Age of Onset  . Hyperlipidemia Mother   . Hypertension Mother   . Hypertension Father   . Stroke  Father   . Breast cancer Paternal Grandmother        Age 28's  . Diabetes Mellitus II Paternal Grandmother   . Stroke Paternal Grandfather   . Hypertension Sister   . Heart failure Maternal Grandfather   . Ovarian cancer Paternal Aunt        Age 8/58   . Ovarian cancer Other        Great Maternal Aunt     SOCIAL HX: see hpi   Current Outpatient Medications:  .  acetaminophen (TYLENOL) 650 MG CR tablet, Take 650 mg by mouth every 8 (eight) hours as needed for pain., Disp: , Rfl:  .  Ascorbic Acid (VITAMIN C PO), Take by mouth daily., Disp: , Rfl:  .  cholecalciferol (VITAMIN D) 1000 units tablet, Take 1,000 Units by mouth daily., Disp: , Rfl:  .  fluticasone (FLONASE) 50 MCG/ACT nasal spray, Place into both nostrils daily., Disp: , Rfl:  .  FOLIC ACID PO, Take 338 mg by mouth daily., Disp: , Rfl:  .  hydrochlorothiazide (HYDRODIURIL) 25 MG tablet, Take 1 tablet (25 mg total) by mouth daily., Disp: 90 tablet, Rfl: 1 .  losartan (COZAAR) 50 MG tablet, Take 1 tablet (50 mg total) by mouth daily., Disp: 90 tablet, Rfl: 1 .  Meloxicam 15 MG TBDP, Take by mouth daily as needed., Disp: ,  Rfl:  .  metFORMIN (GLUCOPHAGE) 1000 MG tablet, Take 1 tablet (1,000 mg total) by mouth 2 (two) times daily with a meal., Disp: 180 tablet, Rfl: 1 .  mometasone (ELOCON) 0.1 % cream, Apply 1 application topically daily., Disp: 45 g, Rfl: 1 .  omeprazole (PRILOSEC) 20 MG capsule, TAKE ONE CAPSULE BY MOUTH DAILY, Disp: 90 capsule, Rfl: 1 .  ORTHO MICRONOR 0.35 MG tablet, Take 1 tablet (0.35 mg total) by mouth daily., Disp: 3 Package, Rfl: 3 .  SALINE NASAL SPRAY NA, Place into the nose as needed., Disp: , Rfl:  .  triamcinolone cream (KENALOG) 0.1 %, , Disp: , Rfl:   EXAM:  Vitals:   01/05/18 0930  BP: 124/80  Pulse: 78  Temp: 98.3 F (36.8 C)    Body mass index is 33.7 kg/m.  GENERAL: vitals reviewed and listed above, alert, oriented, appears well hydrated and in no acute distress  HEENT:  atraumatic, conjunttiva clear, no obvious abnormalities on inspection of external nose and ears  NECK: no obvious masses on inspection  LUNGS: clear to auscultation bilaterally, no wheezes, rales or rhonchi, good air movement  CV: HRRR, no peripheral edema  ABD: Normal inspection of the area of concern, the lump she was feeling is her ASIS and on exam she now feels it on both sides equally.  MS: moves all extremities without noticeable abnormality -see above  PSYCH: pleasant and cooperative, no obvious depression or anxiety  ASSESSMENT AND PLAN:  Discussed the following assessment and plan:  Cough - Plan: DG Chest 2 View, DG Chest 2 View -Suspect this is postnasal drip, either from resolution of the recent upper respiratory infection or silent reflux -She is concerned about a pneumonia and after discussion of risks and benefits prefers to do a chest x-ray to exclude, this is reasonable given she has had 4 weeks of a cough -Otherwise, recommended to follow ear nose and throat recommendations and if not improving try 2 weeks of an over-the-counter low-dose PPI -Follow-up as needed if persists  Lump -It seems that this is her ASIS, on exam this is the area she points to and now she can feel it on both sides -Advised to let us know if anything else pops up  Sensation of fullness in ear, unspecified laterality -Seeing ear nose and throat, follow recommendations per the report, reviewed outside records  Hypertension associated with diabetes ([place]) -Blood pressure little elevated today, however she did not take her blood pressure medications -Recheck physical in 3 months  -Patient advised to return or notify a doctor immediately if symptoms worsen or persist or new concerns arise.  Patient Instructions  BEFORE YOU LEAVE: -chest xray for cough -follow up: CPE if due or follow up in 3 months   Get the chest xray for the cough.  I hope you are feeling better soon! Seek care  promptly if your symptoms worsen, new concerns arise or you are not improving with treatment.    We recommend the following healthy lifestyle for LIFE: 1) Small portions. But, make sure to get regular (at least 3 per day), healthy meals and small healthy snacks if needed.  2) Eat a healthy clean diet.   TRY TO EAT: -at least 5-7 servings of low sugar, colorful, and nutrient rich vegetables per day (not corn, potatoes or bananas.) -berries are the best choice if you wish to eat fruit (only eat small amounts if trying to reduce weight)  -lean meets (fish, white meat of chicken  or [place]) -vegan proteins for some meals - beans or tofu, whole grains, nuts and seeds -Replace bad fats with good fats - good fats include: fish, nuts and seeds, canola oil, olive oil -small amounts of low fat or non fat dairy -small amounts of100 % whole grains - check the lables -drink plenty of water  AVOID: -SUGAR, sweets, anything with added sugar, corn syrup or sweeteners - must read labels as even foods advertised as "healthy" often are loaded with sugar -if you must have a sweetener, small amounts of stevia may be best -sweetened beverages and artificially sweetened beverages -simple starches (rice, bread, potatoes, pasta, chips, etc - small amounts of 100% whole grains are ok) -red meat, pork, butter -fried foods, fast food, processed food, excessive dairy, eggs and coconut.  3)Get at least 150 minutes of sweaty aerobic exercise per week.  4)Reduce stress - consider counseling, meditation and relaxation to balance other aspects of your life.    Lucretia Kern, DO

## 2018-01-05 NOTE — Patient Instructions (Signed)
BEFORE YOU LEAVE: -chest xray for cough -follow up: CPE if due or follow up in 3 months   Get the chest xray for the cough.  I hope you are feeling better soon! Seek care promptly if your symptoms worsen, new concerns arise or you are not improving with treatment.    We recommend the following healthy lifestyle for LIFE: 1) Small portions. But, make sure to get regular (at least 3 per day), healthy meals and small healthy snacks if needed.  2) Eat a healthy clean diet.   TRY TO EAT: -at least 5-7 servings of low sugar, colorful, and nutrient rich vegetables per day (not corn, potatoes or bananas.) -berries are the best choice if you wish to eat fruit (only eat small amounts if trying to reduce weight)  -lean meets (fish, white meat of chicken or [place]) -vegan proteins for some meals - beans or tofu, whole grains, nuts and seeds -Replace bad fats with good fats - good fats include: fish, nuts and seeds, canola oil, olive oil -small amounts of low fat or non fat dairy -small amounts of100 % whole grains - check the lables -drink plenty of water  AVOID: -SUGAR, sweets, anything with added sugar, corn syrup or sweeteners - must read labels as even foods advertised as "healthy" often are loaded with sugar -if you must have a sweetener, small amounts of stevia may be best -sweetened beverages and artificially sweetened beverages -simple starches (rice, bread, potatoes, pasta, chips, etc - small amounts of 100% whole grains are ok) -red meat, pork, butter -fried foods, fast food, processed food, excessive dairy, eggs and coconut.  3)Get at least 150 minutes of sweaty aerobic exercise per week.  4)Reduce stress - consider counseling, meditation and relaxation to balance other aspects of your life.

## 2018-01-09 MED ORDER — DOXYCYCLINE HYCLATE 100 MG PO TABS: 100.0000 mg | ORAL_TABLET | Freq: Two times a day (BID) | ORAL | 0 refills | Status: DC

## 2018-01-09 NOTE — Addendum Note (Signed)
Addended by: Agnes Lawrence on: 01/09/2018 09:59 AM   Modules accepted: Orders

## 2018-02-08 ENCOUNTER — Encounter: Payer: Self-pay | Admitting: Nurse Practitioner

## 2018-02-08 ENCOUNTER — Inpatient Hospital Stay: Payer: 59 | Attending: Hematology and Oncology | Admitting: Nurse Practitioner

## 2018-02-08 ENCOUNTER — Inpatient Hospital Stay: Payer: 59

## 2018-02-08 ENCOUNTER — Telehealth: Payer: Self-pay

## 2018-02-08 VITALS — BP 132/90 | HR 88 | Temp 98.6°F | Resp 18 | Ht 66.0 in | Wt 211.0 lb

## 2018-02-08 DIAGNOSIS — M545 Low back pain: Secondary | ICD-10-CM | POA: Diagnosis not present

## 2018-02-08 DIAGNOSIS — D649 Anemia, unspecified: Secondary | ICD-10-CM

## 2018-02-08 DIAGNOSIS — Z8041 Family history of malignant neoplasm of ovary: Secondary | ICD-10-CM

## 2018-02-08 DIAGNOSIS — E119 Type 2 diabetes mellitus without complications: Secondary | ICD-10-CM | POA: Diagnosis not present

## 2018-02-08 DIAGNOSIS — I10 Essential (primary) hypertension: Secondary | ICD-10-CM | POA: Diagnosis not present

## 2018-02-08 DIAGNOSIS — Z803 Family history of malignant neoplasm of breast: Secondary | ICD-10-CM | POA: Diagnosis not present

## 2018-02-08 LAB — CBC WITH DIFFERENTIAL (CANCER CENTER ONLY)
Basophils Absolute: 0 10*3/uL (ref 0.0–0.1)
Basophils Relative: 1 %
Eosinophils Absolute: 0.1 10*3/uL (ref 0.0–0.5)
Eosinophils Relative: 2 %
HCT: 36.9 % (ref 34.8–46.6)
Hemoglobin: 11.8 g/dL (ref 11.6–15.9)
LYMPHS ABS: 1.7 10*3/uL (ref 0.9–3.3)
LYMPHS PCT: 29 %
MCH: 27.4 pg (ref 25.1–34.0)
MCHC: 32 g/dL (ref 31.5–36.0)
MCV: 85.8 fL (ref 79.5–101.0)
MONO ABS: 0.4 10*3/uL (ref 0.1–0.9)
Monocytes Relative: 8 %
Neutro Abs: 3.6 10*3/uL (ref 1.5–6.5)
Neutrophils Relative %: 60 %
PLATELETS: 297 10*3/uL (ref 145–400)
RBC: 4.3 MIL/uL (ref 3.70–5.45)
RDW: 14.1 % (ref 11.2–14.5)
WBC Count: 5.9 10*3/uL (ref 3.9–10.3)

## 2018-02-08 NOTE — Telephone Encounter (Signed)
Printed avs and calender of upcoming appointment. Per 9/11 los 

## 2018-02-08 NOTE — Progress Notes (Addendum)
[facility]  Telephone:([phone] [phone]  Clinic Follow up Note   Patient Care Team: Lucretia Kern, DO as PCP - General (Family Medicine) Rutherford Guys, MD as Consulting Physician (Ophthalmology) 02/08/2018  DIAGNOSIS: anemia   HISTORY OF PREVENT ILLNESS:  Ms. Panther was previously seen by Dr. Lebron Conners in 07/2017 for mild intermittent normocytic, normochromic anemia dating back to 07/2011 when Hgb was found to be 11.3. CBC was normal in 2017. Hgb dropped slightly to 11.4 in 2018, TSH was normal, B12 slightly elevated. Iron studies in 07/2017 were in low range of normal, ferritin 26, iron 43 and transferrin saturation 12%. She has never been on iron therapy. No evidence of hemolysis. Last CBC in 07/2017 was normal. She denies GI bleeding, FOBT per PCP reportedly was normal; has not had colonoscopy. No history of GI surgeries. LMP in 01/27/18 was early and darker red, but otherwise normal cycles occurring monthly x5 days with regular flow. She had one episode of clots in the past and underwent vaginal [place] per GYN in 07/2017 which was normal.   PMH includes controlled DM and HTN on medication, diagnosed 10 years ago, and GERD. Socially, she works for [employer]. She is somewhat active with walking and yoga. She denies drug, alcohol, or tobacco use. She is single, no children. Independent of ADLs. Family is significant for anemia in her mother, breast cancer in paternal GM in her 22's, ovarian cancer in paternal aunt in late 79's, ovarian cancer in maternal great-aunt, and ovarian cancer in late 20's in maternal cousin. Patient is up to date on mammograms and PAP smear.   INTERVAL HISTORY: Today, she is recovering from recent sinus infection and inner ear infection. She has mild productive cough with yellow sputum. No recent fever, chills, chest pain, or dyspnea. Has mild fatigue. Denies unintentional weight loss or change in appetite. Denies night sweats. No n/v/c/d or  GI bleeding. She has recurrent right low back pain on lying down, that improves when she gets up and is active. Does not radiate. Has had this in the past. Denies changes in her urine.    REVIEW OF SYSTEMS:   Constitutional: Denies fevers, chills or abnormal weight loss (+) mild fatigue  Ears, nose, mouth, throat, and face: Denies mucositis or sore throat Respiratory: Denies dyspnea or wheezes (+) mild productive cough, yellow sputum (+) recent resp infection  Cardiovascular: Denies palpitation, chest discomfort or lower extremity swelling Gastrointestinal:  Denies nausea, vomiting, constipation, diarrhea, hematochezia, heartburn or change in bowel habits GU/GYN: Denies hematuria (+) normal menses, last cycle came early  Skin: Denies abnormal skin rashes Lymphatics: Denies new lymphadenopathy or easy bruising Neurological:Denies numbness, tingling or new weaknesses Behavioral/Psych: Mood is stable, no new changes  MSK: (+) recurrent right low back pain on lying down   All other systems were reviewed with the patient and are negative.  MEDICAL HISTORY:  Past Medical History:  Diagnosis Date  . Abnormal Pap smear of cervix - hpv per report in 2014, seeing gyn 11/16/2013   normal pap with gyn 2015  . Arthritis    hand  . Diabetes mellitus without complication ([place])   . Foreign body of hand, right 07/2011   right MP  . GERD (gastroesophageal reflux disease)   . Headache(784.0)    sinus  . Hypertension    denies HTN, but takes Hyzaar   . Stenosing tenosynovitis of wrist 07/2011   right    SURGICAL HISTORY: Past Surgical History:  Procedure  Laterality Date  . DORSAL COMPARTMENT RELEASE  08/26/2011   Procedure: RELEASE DORSAL COMPARTMENT (DEQUERVAIN);  Surgeon: Cammie Sickle., MD;  Location: [facility];  Service: Orthopedics;  Laterality: Right;  . FOREIGN BODY REMOVAL  08/26/2011   Procedure: FOREIGN BODY REMOVAL ADULT;  Surgeon: Cammie Sickle., MD;  Location:  [facility];  Service: Orthopedics;  Laterality: Right;    I have reviewed the social history and family history with the patient and they are unchanged from previous note.  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  MEDICATIONS:  Current Outpatient Medications  Medication Sig Dispense Refill  . acetaminophen (TYLENOL) 650 MG CR tablet Take 650 mg by mouth every 8 (eight) hours as needed for pain.    . Ascorbic Acid (VITAMIN C PO) Take by mouth daily.    . cholecalciferol (VITAMIN D) 1000 units tablet Take 1,000 Units by mouth daily.    . fluticasone (FLONASE) 50 MCG/ACT nasal spray Place into both nostrils daily.    Marland Kitchen FOLIC ACID PO Take 932 mg by mouth daily.    . hydrochlorothiazide (HYDRODIURIL) 25 MG tablet Take 1 tablet (25 mg total) by mouth daily. 90 tablet 1  . losartan (COZAAR) 50 MG tablet Take 1 tablet (50 mg total) by mouth daily. 90 tablet 1  . metFORMIN (GLUCOPHAGE) 1000 MG tablet Take 1 tablet (1,000 mg total) by mouth 2 (two) times daily with a meal. 180 tablet 1  . mometasone (ELOCON) 0.1 % cream Apply 1 application topically daily. 45 g 1  . omeprazole (PRILOSEC) 20 MG capsule TAKE ONE CAPSULE BY MOUTH DAILY 90 capsule 1  . ORTHO MICRONOR 0.35 MG tablet Take 1 tablet (0.35 mg total) by mouth daily. 3 Package 3  . SALINE NASAL SPRAY NA Place into the nose as needed.    . triamcinolone cream (KENALOG) 0.1 %     . doxycycline (VIBRA-TABS) 100 MG tablet Take 1 tablet (100 mg total) by mouth 2 (two) times daily. 10 tablet 0  . Meloxicam 15 MG TBDP Take by mouth daily as needed.     No current facility-administered medications for this visit.     PHYSICAL EXAMINATION: ECOG PERFORMANCE STATUS: 0 - Asymptomatic  Vitals:   02/08/18 0838 02/08/18 0900  BP: (!) 141/93 132/90  Pulse: 88   Resp: 18   Temp: 98.6 F (37 C)   SpO2: 100%    Filed Weights   02/08/18 0838  Weight: 211 lb (95.7 kg)    GENERAL:alert, no distress and  comfortable SKIN: skin color, texture, turgor are normal, no rashes or significant lesions EYES: sclera clear OROPHARYNX:no thrush or ulcers LYMPH:  no palpable cervical or supraclavicular lymphadenopathy  LUNGS: clear to auscultation with normal breathing effort HEART: regular rate & rhythm, no lower extremity edema ABDOMEN:abdomen soft, non-tender and normal bowel sounds Musculoskeletal:no cyanosis of digits and no clubbing  NEURO: alert & oriented x 3 with fluent speech, no focal motor/sensory deficits  LABORATORY DATA:  I have reviewed the data as listed CBC Latest Ref Rng & Units 02/08/2018 07/18/2017 06/15/2017  WBC 3.9 - 10.3 K/uL 5.9 5.5 4.7  Hemoglobin 11.6 - 15.9 g/dL 11.8 12.0 11.1(L)  Hematocrit 34.8 - 46.6 % 36.9 36.9 34.3(L)  Platelets 145 - 400 K/uL 297 295 274.0     CMP Latest Ref Rng & Units 10/06/2017 07/18/2017 04/04/2017  Glucose 70 - 99 mg/dL 95 73 109(H)  BUN 6 - 23 mg/dL  11 17 17   Creatinine 0.40 - 1.20 mg/dL 0.80 0.92 0.84  Sodium 135 - 145 mEq/L 138 139 137  Potassium 3.5 - 5.1 mEq/L 4.2 3.4(L) 3.7  Chloride 96 - 112 mEq/L 105 104 103  CO2 19 - 32 mEq/L 27 23 27   Calcium 8.4 - 10.5 mg/dL 9.1 9.9 9.2  Total Protein 6.4 - 8.3 g/dL - 7.4 -  Total Bilirubin 0.2 - 1.2 mg/dL - 0.5 -  Alkaline Phos 40 - 150 U/L - 96 -  AST 5 - 34 U/L - 15 -  ALT 0 - 55 U/L - 8 -      RADIOGRAPHIC STUDIES: I have personally reviewed the radiological images as listed and agreed with the findings in the report. No results found.   ASSESSMENT & PLAN: Linda Conrad is a pleasant 42 year old female with intermittent mild anemia, DM, HTN, and GERD  1. Normocytic, normochromic anemia  -The patient was seen with Dr. Burr Medico; we reviewed her medical record in detail with the patient.  -She has chronic mild intermittent normocytic, normochromic anemia, no other cytopenias; no evidence of hemolysis, B12 deficiency, or severe iron deficiency. She reportedly had negative FOBT in the past    -Iron studies in 07/2017 were in low-normal range, Dr. Burr Medico recommends to take multivitamin with iron daily -Her mild anemia and iron deficiency are felt to be related to blood loss from her menses; she does not require aggressive iron replacement with IV Feraheme  -Her mild anemia may also have component of chronic disease related to her DM, although last A1c was 6.0 and appears well controlled  -She will see her PCP next month, we will see her back in 8 months, then likely annually.   2. Family history of cancer -She has multiple family members with cancer, including ovarian cancer on maternal and paternal lineage and breast cancer on paternal side -we reviewed indications for genetic testing and she agrees to meet our genetic counselor, I placed referral today -she is compliant with mammogram and PAP screening   3. Recurrent right low back pain  -Pain is worse when lying down, improves with movement -She had remove lumbar Xray in 2016 that showed minimal degenerative disc changes but otherwise no compression fracture or bony abnormality.  -she had h/o of kidney stones -Could be muscular, we recommend heat and f/u with PCP  PLAN -begin daily multivitamin with iron -Control DM, HTN, and other co-morbidities  -heat for back pain, f/u with PCP -referral to genetics    Orders Placed This Encounter  Procedures  . CBC with Differential (Cancer Center Only)    Standing Status:   Standing    Number of Occurrences:   50    Standing Expiration Date:   02/09/2024  . Iron and TIBC    Standing Status:   Standing    Number of Occurrences:   50    Standing Expiration Date:   02/09/2024  . Ferritin    Standing Status:   Standing    Number of Occurrences:   50    Standing Expiration Date:   02/09/2023  . Ambulatory referral to Genetics    Referral Priority:   Routine    Referral Type:   Consultation    Referral Reason:   Specialty Services Required    Number of Visits Requested:   1   All  questions were answered. The patient knows to call the clinic with any problems, questions or concerns. No barriers to  learning was detected.     Alla Feeling, NP 02/08/18   Addendum  I have seen the patient, examined her. I agree with the assessment and and plan and have edited the notes.   I have reviewed Ms Throgmorton's records including her lab results.  She has mild intermittent anemia, likely related to mild iron deficiency and probable component of anemia of chronic disease.  Her CBC today is completely normal.  No further work-up needed at this point.  I recommend her to try multivitamins with iron, and repeat lab in 8 months. She will see her PCP in the interim with lab. Will refer her to genetics due to family history of breast and ovarian cancer.   Truitt Merle  02/08/2018

## 2018-02-14 ENCOUNTER — Telehealth: Payer: Self-pay | Admitting: Hematology

## 2018-02-14 NOTE — Telephone Encounter (Signed)
Tried multiple times to reach pt to schedule appts per 9/11 sch message

## 2018-03-09 ENCOUNTER — Ambulatory Visit (INDEPENDENT_AMBULATORY_CARE_PROVIDER_SITE_OTHER): Payer: 59

## 2018-03-09 DIAGNOSIS — R059 Cough, unspecified: Secondary | ICD-10-CM

## 2018-03-09 DIAGNOSIS — R05 Cough: Secondary | ICD-10-CM | POA: Diagnosis not present

## 2018-03-16 ENCOUNTER — Encounter: Payer: 59 | Admitting: Family Medicine

## 2018-03-21 ENCOUNTER — Encounter: Payer: Self-pay | Admitting: Family Medicine

## 2018-03-21 ENCOUNTER — Ambulatory Visit (INDEPENDENT_AMBULATORY_CARE_PROVIDER_SITE_OTHER): Payer: 59 | Admitting: Family Medicine

## 2018-03-21 VITALS — BP 100/82 | HR 93 | Temp 98.8°F | Ht 66.0 in | Wt 210.9 lb

## 2018-03-21 DIAGNOSIS — Z0000 Encounter for general adult medical examination without abnormal findings: Secondary | ICD-10-CM

## 2018-03-21 DIAGNOSIS — I10 Essential (primary) hypertension: Secondary | ICD-10-CM | POA: Diagnosis not present

## 2018-03-21 DIAGNOSIS — I152 Hypertension secondary to endocrine disorders: Secondary | ICD-10-CM

## 2018-03-21 DIAGNOSIS — E1159 Type 2 diabetes mellitus with other circulatory complications: Secondary | ICD-10-CM | POA: Diagnosis not present

## 2018-03-21 DIAGNOSIS — Z23 Encounter for immunization: Secondary | ICD-10-CM

## 2018-03-21 DIAGNOSIS — E119 Type 2 diabetes mellitus without complications: Secondary | ICD-10-CM | POA: Diagnosis not present

## 2018-03-21 DIAGNOSIS — J3489 Other specified disorders of nose and nasal sinuses: Secondary | ICD-10-CM

## 2018-03-21 NOTE — Patient Instructions (Signed)
BEFORE YOU LEAVE: -flu shot -follow up: 3 months  We have ordered labs or studies at this visit. It can take up to 1-2 weeks for results and processing. IF results require follow up or explanation, we will call you with instructions. Clinically stable results will be released to your [facility]. If you have not heard from [place] or cannot find your results in [facility] in 2 weeks please contact our office at [phone].  If you are not yet signed up for [facility], please consider signing up.  Follow up with ENT about the nasal lesion if not resolving. Labs are pending.   Preventive Care 40-64 Years, Female Preventive care refers to lifestyle choices and visits with your health care provider that can promote health and wellness. What does preventive care include?  A yearly physical exam. This is also called an annual well check.  Dental exams once or twice a year.  Routine eye exams. Ask your health care provider how often you should have your eyes checked.  Personal lifestyle choices, including: ? Daily care of your teeth and gums. ? Regular physical activity. ? Eating a healthy diet. ? Avoiding tobacco and drug use. ? Limiting alcohol use. ? Practicing safe sex. ? Taking vitamin and mineral supplements as recommended by your health care provider. What happens during an annual well check? The services and screenings done by your health care provider during your annual well check will depend on your age, overall health, lifestyle risk factors, and family history of disease. Counseling Your health care provider may ask you questions about your:  Alcohol use.  Tobacco use.  Drug use.  Emotional well-being.  Home and relationship well-being.  Sexual activity.  Eating habits.  Work and work Statistician.  Method of birth control.  Menstrual cycle.  Pregnancy history.  Screening You may have the following tests or measurements:  Height, weight, and BMI.  Blood  pressure.  Lipid and cholesterol levels. These may be checked every 5 years, or more frequently if you are over 52 years old.  Skin check.  Lung cancer screening. You may have this screening every year starting at age 39 if you have a 30-pack-year history of smoking and currently smoke or have quit within the past 15 years.  Fecal occult blood test (FOBT) of the stool. You may have this test every year starting at age 17.  Flexible sigmoidoscopy or colonoscopy. You may have a sigmoidoscopy every 5 years or a colonoscopy every 10 years starting at age 67.  Hepatitis C blood test.  Hepatitis B blood test.  Sexually transmitted disease (STD) testing.  Diabetes screening. This is done by checking your blood sugar (glucose) after you have not eaten for a while (fasting). You may have this done every 1-3 years.  Mammogram. This may be done every 1-2 years. Talk to your health care provider about when you should start having regular mammograms. This may depend on whether you have a family history of breast cancer.  BRCA-related cancer screening. This may be done if you have a family history of breast, ovarian, tubal, or peritoneal cancers.  Pelvic exam and Pap test. This may be done every 3 years starting at age 65. Starting at age 26, this may be done every 5 years if you have a Pap test in combination with an HPV test.  Bone density scan. This is done to screen for osteoporosis. You may have this scan if you are at high risk for osteoporosis.  Discuss your test  results, treatment options, and if necessary, the need for more tests with your health care provider. Vaccines Your health care provider may recommend certain vaccines, such as:  Influenza vaccine. This is recommended every year.  Tetanus, diphtheria, and acellular pertussis (Tdap, Td) vaccine. You may need a Td booster every 10 years.  Varicella vaccine. You may need this if you have not been vaccinated.  Zoster vaccine. You  may need this after age 17.  Measles, mumps, and rubella (MMR) vaccine. You may need at least one dose of MMR if you were born in 1957 or later. You may also need a second dose.  Pneumococcal 13-valent conjugate (PCV13) vaccine. You may need this if you have certain conditions and were not previously vaccinated.  Pneumococcal polysaccharide (PPSV23) vaccine. You may need one or two doses if you smoke cigarettes or if you have certain conditions.  Meningococcal vaccine. You may need this if you have certain conditions.  Hepatitis A vaccine. You may need this if you have certain conditions or if you travel or work in places where you may be exposed to hepatitis A.  Hepatitis B vaccine. You may need this if you have certain conditions or if you travel or work in places where you may be exposed to hepatitis B.  Haemophilus influenzae type b (Hib) vaccine. You may need this if you have certain conditions.  Talk to your health care provider about which screenings and vaccines you need and how often you need them. This information is not intended to replace advice given to you by your health care provider. Make sure you discuss any questions you have with your health care provider. Document Released: 06/13/2015 Document Revised: 02/04/2016 Document Reviewed: 03/18/2015 Elsevier Interactive Patient Education  [employer].

## 2018-03-21 NOTE — Progress Notes (Signed)
HPI:  Using dictation device. Unfortunately this device frequently misinterprets words/phrases.  Here for CPE:  -Concerns and/or follow up today:  Chronic medical problems summarized below were reviewed for changes. Understand may be additional charge for addressing at preventive visit, but prefer this over separate visit.   New concern R nares lesion: -has occurred 3 times in the last year -seeing ent, reports they gave her mupirocin, doesn't seem to help -reports this started up this past week -painful crusting lesion R nares - she is treating with flonase, mupirocin and hydrogen peroxide -no fever, malaise, nose bleed -reports no hx herpes here, denies any culture done with ENT  Chronic issues:  Diabetes mellitus: -meds: metformin 1000bid, arb -no known complications -wants to eventually stop medications- prefers not to take medications, not on statin or asa  HTN/Obesity: -meds: losartan-hctz  GERD: -meds: prilosecas needed -stable  Anemia: -sees gyn for heavy menstrual bleeding -stool cards and anemia panel normal -she saw hematologist 2019 given her anxiety regarding this - per note felt related to menstrual cycles, doing genetic screening for FH  -Diet: variety of foods, balance and well rounded, larger portion sizes -Exercise: trying to get regular exercies -Taking folic acid, vitamin D or calcium: no -Diabetes and Dyslipidemia Screening: labs, fasting 4 hours -Vaccines: see vaccine section EPIC -pap history: sees gyn -FDLMP: see nursing notes -sexual activity: not discussed -wants STI testing (Hep C if born 36-65): no -FH breast, colon or ovarian ca: see FH, seeing onc and plans to do genetic testing Last mammogram: done Last colon cancer screening: n/a Breast Ca Risk Assessment: see family history and pt history DEXA (>/= 65): n/a  -Alcohol, Tobacco, drug use: see social history  Review of Systems - no fevers, unintentional weight loss, vision  loss, hearing loss, chest pain, sob, hemoptysis, melena, hematochezia, hematuria, genital discharge, changing or concerning skin lesions, bleeding, bruising, loc, thoughts of self harm or SI  Past Medical History:  Diagnosis Date  . Abnormal Pap smear of cervix - hpv per report in 2014, seeing gyn 11/16/2013   normal pap with gyn 2015  . Arthritis    hand  . Diabetes mellitus without complication ([place])   . Foreign body of hand, right 07/2011   right MP  . GERD (gastroesophageal reflux disease)   . Headache(784.0)    sinus  . Hypertension    denies HTN, but takes Hyzaar   . Stenosing tenosynovitis of wrist 07/2011   right    Past Surgical History:  Procedure Laterality Date  . DORSAL COMPARTMENT RELEASE  08/26/2011   Procedure: RELEASE DORSAL COMPARTMENT (DEQUERVAIN);  Surgeon: Cammie Sickle., MD;  Location[facility];  Service: Orthopedics;  Laterality: Right;  . FOREIGN BODY REMOVAL  08/26/2011   Procedure: FOREIGN BODY REMOVAL ADULT;  Surgeon: Cammie Sickle., MD;  Location: [place];  Service: Orthopedics;  Laterality: Right;    Family History  Problem Relation Age of Onset  . Hyperlipidemia Mother   . Hypertension Mother   . Hypertension Father   . Stroke Father   . Breast cancer Paternal Grandmother        Age 2's  . Diabetes Mellitus II Paternal Grandmother   . Stroke Paternal Grandfather   . Hypertension Sister   . Heart failure Maternal Grandfather   . Ovarian cancer Paternal Aunt        Age 21/58   . Ovarian cancer Other        Great Maternal  Aunt   . Ovarian cancer Cousin 48       mother's brother's daughter, ovarian     Social History   Socioeconomic History  . Marital status: Single    Spouse name: Not on file  . Number of children: 0  . Years of education: Not on file  . Highest education level: Not on file  Occupational History  . Not on file  Social Needs  . Financial resource strain: Not on file  . Food  insecurity:    Worry: Not on file    Inability: Not on file  . Transportation needs:    Medical: Not on file    Non-medical: Not on file  Tobacco Use  . Smoking status: Never Smoker  . Smokeless tobacco: Never Used  Substance and Sexual Activity  . Alcohol use: Yes    Alcohol/week: 0.0 standard drinks    Comment: occasionally  . Drug use: No  . Sexual activity: Never    Birth control/protection: Pill    Comment: Micronor  Lifestyle  . Physical activity:    Days per week: Not on file    Minutes per session: Not on file  . Stress: Not on file  Relationships  . Social connections:    Talks on phone: Not on file    Gets together: Not on file    Attends religious service: Not on file    Active member of club or organization: Not on file    Attends meetings of clubs or organizations: Not on file    Relationship status: Not on file  Other Topics Concern  . Not on file  Social History Narrative   Work or School: works with senior center      Home Situation: lives alone      Spiritual Beliefs: none, Christian      Lifestyle: active at work but not otherwise; avoiding carbs              Current Outpatient Medications:  .  acetaminophen (TYLENOL) 650 MG CR tablet, Take 650 mg by mouth every 8 (eight) hours as needed for pain., Disp: , Rfl:  .  Ascorbic Acid (VITAMIN C PO), Take by mouth daily., Disp: , Rfl:  .  cholecalciferol (VITAMIN D) 1000 units tablet, Take 1,000 Units by mouth daily., Disp: , Rfl:  .  fluticasone (FLONASE) 50 MCG/ACT nasal spray, Place into both nostrils daily., Disp: , Rfl:  .  FOLIC ACID PO, Take 073 mg by mouth daily., Disp: , Rfl:  .  hydrochlorothiazide (HYDRODIURIL) 25 MG tablet, Take 1 tablet (25 mg total) by mouth daily., Disp: 90 tablet, Rfl: 1 .  losartan (COZAAR) 50 MG tablet, Take 1 tablet (50 mg total) by mouth daily., Disp: 90 tablet, Rfl: 1 .  Meloxicam 15 MG TBDP, Take by mouth daily as needed., Disp: , Rfl:  .  metFORMIN (GLUCOPHAGE)  1000 MG tablet, Take 1 tablet (1,000 mg total) by mouth 2 (two) times daily with a meal., Disp: 180 tablet, Rfl: 1 .  mometasone (ELOCON) 0.1 % cream, Apply 1 application topically daily., Disp: 45 g, Rfl: 1 .  omeprazole (PRILOSEC) 20 MG capsule, TAKE ONE CAPSULE BY MOUTH DAILY, Disp: 90 capsule, Rfl: 1 .  ORTHO MICRONOR 0.35 MG tablet, Take 1 tablet (0.35 mg total) by mouth daily., Disp: 3 Package, Rfl: 3 .  SALINE NASAL SPRAY NA, Place into the nose as needed., Disp: , Rfl:  .  triamcinolone cream (KENALOG) 0.1 %, , Disp: ,  Rfl:   EXAM:  Vitals:   03/21/18 1508  BP: 100/82  Pulse: 93  Temp: 98.8 F (37.1 C)  SpO2: 98%  Body mass index is 34.04 kg/m.   GENERAL: vitals reviewed and listed below, alert, oriented, appears well hydrated and in no acute distress  HEENT: head atraumatic, PERRLA, normal appearance of eyes, ears, nose and mouth. moist mucus membranes. Crusty lesion R medial nares.  NECK: supple, no masses or lymphadenopathy  LUNGS: clear to auscultation bilaterally, no rales, rhonchi or wheeze  CV: HRRR, no peripheral edema or cyanosis, normal pedal pulses  ABDOMEN: bowel sounds normal, soft, non tender to palpation, no masses, no rebound or guarding, no hernia  GU/BREAST: declined, does with gyn  SKIN: no rash or abnormal lesions  MS: normal gait, moves all extremities normally  NEURO: normal gait, speech and thought processing grossly intact, muscle tone grossly intact throughout  PSYCH: normal affect, pleasant and cooperative  ASSESSMENT AND PLAN:  Discussed the following assessment and plan:  PREVENTIVE EXAM: -Discussed and advised all [place] preventive services health task force level A and B recommendations for age, sex and risks. -Advised at least 150 minutes of exercise per week and a healthy diet with avoidance of (less then 1 serving per week) processed foods, white starches, red meat, fast foods and sweets and consisting of: * 5-9 servings of fresh  fruits and vegetables (not corn or potatoes) *nuts and seeds, beans *olives and olive oil *lean meats such as fish and white chicken  *whole grains -labs, studies and vaccines per orders this encounter  2. Nasal lesion -we discussed possible serious and likely etiologies, workup and treatment, treatment risks and return precautions -after this discussion, [place] opted for culture/viral culture today; aquaphor, mupirocin in intertim, follow up ent if worsening or persists -of course, we advised [place]  to return or notify a doctor immediately if symptoms worsen or persist or new concerns arise. - Viral culture - WOUND CULTURE  3. Type 2 diabetes mellitus without complication, without long-term current use of insulin ([facility]) -lifestyle recs -cont current meds pending labs - Hemoglobin A1c - Lipid panel  4. Hypertension associated with diabetes ([place]) -cont current tx, lifestyle recs - Basic metabolic panel  5. Need for immunization against influenza - Flu Vaccine QUAD 6+ mos PF IM (Fluarix Quad PF)    Patient advised to return to clinic immediately if symptoms worsen or persist or new concerns.  Patient Instructions  BEFORE YOU LEAVE: -flu shot -follow up: 3 months  We have ordered labs or studies at this visit. It can take up to 1-2 weeks for results and processing. IF results require follow up or explanation, we will call you with instructions. Clinically stable results will be released to your [facility]. If you have not heard from [place] or cannot find your results in [facility] in 2 weeks please contact our office at [phone].  If you are not yet signed up for [facility], please consider signing up.  Follow up with ENT about the nasal lesion if not resolving. Labs are pending.   Preventive Care 40-64 Years, Female Preventive care refers to lifestyle choices and visits with your health care provider that can promote health and wellness. What does preventive care include?  A yearly  physical exam. This is also called an annual well check.  Dental exams once or twice a year.  Routine eye exams. Ask your health care provider how often you should have your eyes checked.  Personal lifestyle choices, including: ?  Daily care of your teeth and gums. ? Regular physical activity. ? Eating a healthy diet. ? Avoiding tobacco and drug use. ? Limiting alcohol use. ? Practicing safe sex. ? Taking vitamin and mineral supplements as recommended by your health care provider. What happens during an annual well check? The services and screenings done by your health care provider during your annual well check will depend on your age, overall health, lifestyle risk factors, and family history of disease. Counseling Your health care provider may ask you questions about your:  Alcohol use.  Tobacco use.  Drug use.  Emotional well-being.  Home and relationship well-being.  Sexual activity.  Eating habits.  Work and work Statistician.  Method of birth control.  Menstrual cycle.  Pregnancy history.  Screening You may have the following tests or measurements:  Height, weight, and BMI.  Blood pressure.  Lipid and cholesterol levels. These may be checked every 5 years, or more frequently if you are over 66 years old.  Skin check.  Lung cancer screening. You may have this screening every year starting at age 41 if you have a 30-pack-year history of smoking and currently smoke or have quit within the past 15 years.  Fecal occult blood test (FOBT) of the stool. You may have this test every year starting at age 45.  Flexible sigmoidoscopy or colonoscopy. You may have a sigmoidoscopy every 5 years or a colonoscopy every 10 years starting at age 22.  Hepatitis C blood test.  Hepatitis B blood test.  Sexually transmitted disease (STD) testing.  Diabetes screening. This is done by checking your blood sugar (glucose) after you have not eaten for a while (fasting). You  may have this done every 1-3 years.  Mammogram. This may be done every 1-2 years. Talk to your health care provider about when you should start having regular mammograms. This may depend on whether you have a family history of breast cancer.  BRCA-related cancer screening. This may be done if you have a family history of breast, ovarian, tubal, or peritoneal cancers.  Pelvic exam and Pap test. This may be done every 3 years starting at age 60. Starting at age 84, this may be done every 5 years if you have a Pap test in combination with an HPV test.  Bone density scan. This is done to screen for osteoporosis. You may have this scan if you are at high risk for osteoporosis.  Discuss your test results, treatment options, and if necessary, the need for more tests with your health care provider. Vaccines Your health care provider may recommend certain vaccines, such as:  Influenza vaccine. This is recommended every year.  Tetanus, diphtheria, and acellular pertussis (Tdap, Td) vaccine. You may need a Td booster every 10 years.  Varicella vaccine. You may need this if you have not been vaccinated.  Zoster vaccine. You may need this after age 42.  Measles, mumps, and rubella (MMR) vaccine. You may need at least one dose of MMR if you were born in 1957 or later. You may also need a second dose.  Pneumococcal 13-valent conjugate (PCV13) vaccine. You may need this if you have certain conditions and were not previously vaccinated.  Pneumococcal polysaccharide (PPSV23) vaccine. You may need one or two doses if you smoke cigarettes or if you have certain conditions.  Meningococcal vaccine. You may need this if you have certain conditions.  Hepatitis A vaccine. You may need this if you have certain conditions or if you travel or  work in places where you may be exposed to hepatitis A.  Hepatitis B vaccine. You may need this if you have certain conditions or if you travel or work in places where you  may be exposed to hepatitis B.  Haemophilus influenzae type b (Hib) vaccine. You may need this if you have certain conditions.  Talk to your health care provider about which screenings and vaccines you need and how often you need them. This information is not intended to replace advice given to you by your health care provider. Make sure you discuss any questions you have with your health care provider. Document Released: 06/13/2015 Document Revised: 02/04/2016 Document Reviewed: 03/18/2015 Elsevier Interactive Patient Education  2018 [employer].          No follow-ups on file.  Lucretia Kern, DO

## 2018-03-22 LAB — BASIC METABOLIC PANEL
BUN: 19 mg/dL (ref 6–23)
CO2: 27 mEq/L (ref 19–32)
Calcium: 10 mg/dL (ref 8.4–10.5)
Chloride: 99 mEq/L (ref 96–112)
Creatinine, Ser: 0.9 mg/dL (ref 0.40–1.20)
GFR: 87.63 mL/min (ref >60.00)
Glucose, Bld: 73 mg/dL (ref 70–99)
Potassium: 3.6 mEq/L (ref 3.5–5.1)
Sodium: 135 mEq/L (ref 135–145)

## 2018-03-22 LAB — LIPID PANEL
CHOL/HDL RATIO: 3
Cholesterol: 143 mg/dL (ref 0–200)
HDL: 49.4 mg/dL (ref >39.00)
LDL Cholesterol: 71 mg/dL (ref 0–99)
NONHDL: 93.48
Triglycerides: 114 mg/dL (ref 0.0–149.0)
VLDL: 22.8 mg/dL (ref 0.0–40.0)

## 2018-03-22 LAB — HEMOGLOBIN A1C: Hgb A1c MFr Bld: 6.3 % (ref 4.6–6.5)

## 2018-04-07 LAB — VIRAL CULTURE VIRC
MICRO NUMBER: 91268815
SPECIMEN QUALITY: ADEQUATE

## 2018-04-07 LAB — WOUND CULTURE
MICRO NUMBER:: 91270038
RESULT: NORMAL
SPECIMEN QUALITY: ADEQUATE

## 2018-05-04 DIAGNOSIS — M25562 Pain in left knee: Secondary | ICD-10-CM | POA: Diagnosis not present

## 2018-05-04 DIAGNOSIS — M79642 Pain in left hand: Secondary | ICD-10-CM | POA: Diagnosis not present

## 2018-05-04 DIAGNOSIS — M79641 Pain in right hand: Secondary | ICD-10-CM | POA: Diagnosis not present

## 2018-05-26 ENCOUNTER — Other Ambulatory Visit: Payer: Self-pay | Admitting: Family Medicine

## 2018-05-26 NOTE — Telephone Encounter (Signed)
Requested medication (s) are due for refill today: yes  Requested medication (s) are on the active medication list: yes  Last refill:  Last filled by historical provider  Future visit scheduled: yes  Notes to clinic:  Last filled by historical provider    Requested Prescriptions  Pending Prescriptions Disp Refills   triamcinolone cream (KENALOG) 0.1 % 30 g      Dermatology:  Corticosteroids Passed - 05/26/2018  3:55 PM      Passed - Valid encounter within last 12 months    Recent Outpatient Visits          2 months ago Encounter for preventive health examination   [facility]rap[facility], [place], DO   4 months ago Cough   Therapist, [facility] at [place], DO   5 months ago Non-recurrent acute suppurative otitis media of right ear without spontaneous rupture of tympanic membrane   [place] [facility], [place], DO   7 months ago Type 2 diabetes mellitus without complication, without long-term current use of insulin ([place])   Therapist[facility], [place], DO   10 months ago Type 2 diabetes mellitus without complication, without long-term current use of insulin ([place])   Therapist[facility], [place], DO      Future Appointments            In 1 week Maudie Mercury, Nickola Major, [place] at [place], [facility]

## 2018-05-26 NOTE — Telephone Encounter (Signed)
Copied from [place] [id number]. Topic: Quick Communication - Rx Refill/Question >> May 26, 2018  3:51 PM [place], [place] D wrote: Medication: triamcinolone cream (KENALOG) 0.1 %   Has the patient contacted their pharmacy? Yes.   (Agent: If no, request that the patient contact the pharmacy for the refill.) (Agent: If yes, when and what did the pharmacy advise?)  Preferred Pharmacy (with phone number or street name): [facility] [address] [place] - [place] [phone] (Phone) [phone] (Fax)    Agent: Please be advised that RX refills may take up to 3 business days. We ask that you follow-up with your pharmacy.

## 2018-05-29 NOTE — Telephone Encounter (Signed)
Can you see what she is planning to use this for? If eczema ok to refill. O/w would recommend evaluation. Thanks.

## 2018-05-29 NOTE — Telephone Encounter (Signed)
Unable to leave a message due to voicemail being full. 

## 2018-05-30 MED ORDER — TRIAMCINOLONE ACETONIDE 0.1 % EX CREA: TOPICAL_CREAM | Freq: Every day | CUTANEOUS | 1 refills | Status: AC

## 2018-05-30 NOTE — Telephone Encounter (Signed)
I called the pt and she stated she uses this for eczema noted around the sides of her forehead.  She is aware the Rx was sent to her pharmacy.

## 2018-05-31 DIAGNOSIS — N870 Mild cervical dysplasia: Secondary | ICD-10-CM

## 2018-05-31 HISTORY — DX: Mild cervical dysplasia: N87.0

## 2018-06-01 NOTE — Progress Notes (Signed)
HPI:  Using dictation device. Unfortunately this device frequently misinterprets words/phrases.  Linda Conrad is a pleasant 43 y.o. here for follow up. Chronic medical problems summarized below were reviewed for changes and stability and were updated as needed below. These issues and their treatment remain stable for the most part. Reports doing well and getting back into walking with the trun of the new year. Denies CP, SOB, DOE, treatment intolerance or new symptoms. Occ neck spasm, mainly on R. Poor posture and screen time. Working on this and also getting massage. No radiation, weakness, numbness, severe pain. CPE 03/21/2018  Diabetes mellitus: -meds: metformin 1000bid, arb -wants to eventually stop medications- prefers not to take medications, not on statin (refused)  HTN/Obesity: -meds: losartan-hctz  GERD: -meds: prilosecas needed -stable  Anemia: -sees gyn for heavy menstrual bleeding -stool cards and anemia panel normal -she saw hematologist 2019 given her anxiety regarding this - per note felt related to menstrual cycles, doing genetic screening for FH  ROS: See pertinent positives and negatives per HPI.  Past Medical History:  Diagnosis Date  . Abnormal Pap smear of cervix - hpv per report in 2014, seeing gyn 11/16/2013   normal pap with gyn 2015  . Arthritis    hand  . Diabetes mellitus without complication ([place])   . Foreign body of hand, right 07/2011   right MP  . GERD (gastroesophageal reflux disease)   . Headache(784.0)    sinus  . Hypertension    denies HTN, but takes Hyzaar   . Stenosing tenosynovitis of wrist 07/2011   right    Past Surgical History:  Procedure Laterality Date  . DORSAL COMPARTMENT RELEASE  08/26/2011   Procedure: RELEASE DORSAL COMPARTMENT (DEQUERVAIN);  Surgeon: Cammie Sickle., MD;  Location: [facility];  Service: Orthopedics;  Laterality: Right;  . FOREIGN BODY REMOVAL  08/26/2011   Procedure: FOREIGN  BODY REMOVAL ADULT;  Surgeon: Cammie Sickle., MD;  Location: [place];  Service: Orthopedics;  Laterality: Right;    Family History  Problem Relation Age of Onset  . Hyperlipidemia Mother   . Hypertension Mother   . Hypertension Father   . Stroke Father   . Breast cancer Paternal Grandmother        Age 71's  . Diabetes Mellitus II Paternal Grandmother   . Stroke Paternal Grandfather   . Hypertension Sister   . Heart failure Maternal Grandfather   . Ovarian cancer Paternal Aunt        Age 76/58   . Ovarian cancer Other        Great Maternal Aunt   . Ovarian cancer Cousin 55       mother's brother's daughter, ovarian     SOCIAL HX: see hpi   Current Outpatient Medications:  .  acetaminophen (TYLENOL) 650 MG CR tablet, Take 650 mg by mouth every 8 (eight) hours as needed for pain., Disp: , Rfl:  .  Ascorbic Acid (VITAMIN C PO), Take by mouth daily., Disp: , Rfl:  .  cholecalciferol (VITAMIN D) 1000 units tablet, Take 1,000 Units by mouth daily., Disp: , Rfl:  .  fluticasone (FLONASE) 50 MCG/ACT nasal spray, Place into both nostrils daily., Disp: , Rfl:  .  FOLIC ACID PO, Take 656 mg by mouth daily., Disp: , Rfl:  .  hydrochlorothiazide (HYDRODIURIL) 25 MG tablet, Take 1 tablet (25 mg total) by mouth daily., Disp: 90 tablet, Rfl: 1 .  losartan (COZAAR) 50 MG tablet,  Take 1 tablet (50 mg total) by mouth daily., Disp: 90 tablet, Rfl: 1 .  Meloxicam 15 MG TBDP, Take by mouth daily as needed., Disp: , Rfl:  .  metFORMIN (GLUCOPHAGE) 1000 MG tablet, Take 1 tablet (1,000 mg total) by mouth 2 (two) times daily with a meal., Disp: 180 tablet, Rfl: 1 .  mometasone (ELOCON) 0.1 % cream, Apply 1 application topically daily., Disp: 45 g, Rfl: 1 .  omeprazole (PRILOSEC) 20 MG capsule, TAKE ONE CAPSULE BY MOUTH DAILY, Disp: 90 capsule, Rfl: 1 .  ORTHO MICRONOR 0.35 MG tablet, Take 1 tablet (0.35 mg total) by mouth daily., Disp: 3 Package, Rfl: 3 .  SALINE NASAL SPRAY NA,  Place into the nose as needed., Disp: , Rfl:  .  triamcinolone cream (KENALOG) 0.1 %, Apply topically daily., Disp: 30 g, Rfl: 1  EXAM:  Vitals:   06/05/18 1023  BP: 122/78  Pulse: 91  Temp: 98.7 F (37.1 C)    Body mass index is 34.4 kg/m.  GENERAL: vitals reviewed and listed above, alert, oriented, appears well hydrated and in no acute distress  HEENT: atraumatic, conjunttiva clear, no obvious abnormalities on inspection of external nose and ears  NECK: no obvious masses on inspection, normal ROM head and neck, neg spurling, no bony TTP, some mild TTP cervical paraspinal muscles R>L, normal NV exam upper extremities, poor posture - head forward  LUNGS: clear to auscultation bilaterally, no wheezes, rales or rhonchi, good air movement  CV: HRRR, no peripheral edema  MS: moves all extremities without noticeable abnormality  PSYCH: pleasant and cooperative, no obvious depression or anxiety  ASSESSMENT AND PLAN:  Discussed the following assessment and plan:  Type 2 diabetes mellitus without complication, without long-term current use of insulin (HCC)  Hypertension associated with diabetes (HCC)  Obesity (BMI 30.0-34.9)  Neck muscle spasm  -foot exam done --we discussed possible serious and likely etiologies, workup and treatment, treatment risks and return precautions for the neck pain -after this discussion, Linda Conrad opted for home postural exercises, massage -follow up advised 3 months, or sooner if persists -of course, we advised Linda Conrad  to return or notify a doctor immediately if symptoms worsen or persist or new concerns arise. -labs next visit -lifestyle recs -Patient advised to return or notify a doctor immediately if symptoms worsen or persist or new concerns arise.  Patient Instructions  BEFORE YOU LEAVE: -follow up: 3-4 months, labs then, take medications before appointment  Do the postural neck strengthening exercises daily: -stand with heals, buttocks,  shoulder blades and head against wall -press back against wall gently with your head for 5 seconds, then against counter pressure with finger tips: to the right for 5 seconds, forward for 5 seconds, then to the left for 5 second (keep head straight and against wall the entire time) -repeat 10 times  I hope you are feeling better soon! Seek care promptly if your symptoms worsen, new concerns arise or you are not improving with treatment.   We recommend the following healthy lifestyle for LIFE: 1) Small portions. But, make sure to get regular (at least 3 per day), healthy meals and small healthy snacks if needed.  2) Eat a healthy clean diet.   TRY TO EAT: -at least 5-7 servings of low sugar, colorful, and nutrient rich vegetables per day (not corn, potatoes or bananas.) -berries are the best choice if you wish to eat fruit (only eat small amounts if trying to reduce weight)  -lean meets (fish, white  meat of chicken or [place]) -vegan proteins for some meals - beans or tofu, whole grains, nuts and seeds -Replace bad fats with good fats - good fats include: fish, nuts and seeds, canola oil, olive oil -small amounts of low fat or non fat dairy -small amounts of100 % whole grains - check the lables -drink plenty of water  AVOID: -SUGAR, sweets, anything with added sugar, corn syrup or sweeteners - must read labels as even foods advertised as "healthy" often are loaded with sugar -if you must have a sweetener, small amounts of stevia may be best -sweetened beverages and artificially sweetened beverages -simple starches (rice, bread, potatoes, pasta, chips, etc - small amounts of 100% whole grains are ok) -red meat, pork, butter -fried foods, fast food, processed food, excessive dairy, eggs and coconut.  3)Get at least 150 minutes of sweaty aerobic exercise per week.  4)Reduce stress - consider counseling, meditation and relaxation to balance other aspects of your life.       Lucretia Kern, DO

## 2018-06-05 ENCOUNTER — Ambulatory Visit: Payer: 59 | Admitting: Family Medicine

## 2018-06-05 ENCOUNTER — Encounter: Payer: Self-pay | Admitting: Family Medicine

## 2018-06-05 VITALS — BP 122/78 | HR 91 | Temp 98.7°F | Ht 66.0 in | Wt 213.1 lb

## 2018-06-05 DIAGNOSIS — E1159 Type 2 diabetes mellitus with other circulatory complications: Secondary | ICD-10-CM | POA: Diagnosis not present

## 2018-06-05 DIAGNOSIS — E119 Type 2 diabetes mellitus without complications: Secondary | ICD-10-CM

## 2018-06-05 DIAGNOSIS — E669 Obesity, unspecified: Secondary | ICD-10-CM

## 2018-06-05 DIAGNOSIS — I152 Hypertension secondary to endocrine disorders: Secondary | ICD-10-CM

## 2018-06-05 DIAGNOSIS — I10 Essential (primary) hypertension: Secondary | ICD-10-CM

## 2018-06-05 DIAGNOSIS — M62838 Other muscle spasm: Secondary | ICD-10-CM

## 2018-06-05 NOTE — Patient Instructions (Signed)
BEFORE YOU LEAVE: -follow up: 3-4 months, labs then, take medications before appointment  Do the postural neck strengthening exercises daily: -stand with heals, buttocks, shoulder blades and head against wall -press back against wall gently with your head for 5 seconds, then against counter pressure with finger tips: to the right for 5 seconds, forward for 5 seconds, then to the left for 5 second (keep head straight and against wall the entire time) -repeat 10 times  I hope you are feeling better soon! Seek care promptly if your symptoms worsen, new concerns arise or you are not improving with treatment.   We recommend the following healthy lifestyle for LIFE: 1) Small portions. But, make sure to get regular (at least 3 per day), healthy meals and small healthy snacks if needed.  2) Eat a healthy clean diet.   TRY TO EAT: -at least 5-7 servings of low sugar, colorful, and nutrient rich vegetables per day (not corn, potatoes or bananas.) -berries are the best choice if you wish to eat fruit (only eat small amounts if trying to reduce weight)  -lean meets (fish, white meat of chicken or [place]) -vegan proteins for some meals - beans or tofu, whole grains, nuts and seeds -Replace bad fats with good fats - good fats include: fish, nuts and seeds, canola oil, olive oil -small amounts of low fat or non fat dairy -small amounts of100 % whole grains - check the lables -drink plenty of water  AVOID: -SUGAR, sweets, anything with added sugar, corn syrup or sweeteners - must read labels as even foods advertised as "healthy" often are loaded with sugar -if you must have a sweetener, small amounts of stevia may be best -sweetened beverages and artificially sweetened beverages -simple starches (rice, bread, potatoes, pasta, chips, etc - small amounts of 100% whole grains are ok) -red meat, pork, butter -fried foods, fast food, processed food, excessive dairy, eggs and coconut.  3)Get at least  150 minutes of sweaty aerobic exercise per week.  4)Reduce stress - consider counseling, meditation and relaxation to balance other aspects of your life.

## 2018-06-08 ENCOUNTER — Telehealth: Payer: Self-pay | Admitting: Family Medicine

## 2018-06-08 NOTE — Telephone Encounter (Signed)
Unable to leave a message due to voicemail being full.  CRM also created. 

## 2018-06-08 NOTE — Telephone Encounter (Signed)
Metformin can have some diarrhea and stomach side effects - though not usually severe that she is describing in this message. Ok to lower to 500mg . But, must eat a very healthy low sugar diet and get regular exercise to ensure blood sugar stays good. Also need follow up in a few months to recheck. If any bowel issues persist advise she schedule appt promptly.

## 2018-06-08 NOTE — Telephone Encounter (Signed)
Copied from [place] [id number]. Topic: Quick Communication - See Telephone Encounter >> Jun 08, 2018 10:01 AM Vernona Rieger wrote: CRM for notification. See Telephone encounter for: 06/08/18.  Patient states she was in the office on Monday with Dr Maudie Mercury and mention to her that she has been having stomach pains and cramps and can hardly make it to the bathroom. She said her mom is having the same symptoms and she went to her doctor yesterday and they lowered her Metformin. She would like to know could Dr Maudie Mercury lower her dosage on her metFORMIN (GLUCOPHAGE) 1000 MG tablet. She said she did not take it yesterday and felt good and has not took it today. Please Advise.

## 2018-06-09 NOTE — Telephone Encounter (Signed)
Unable to leave a message due to voicemail being full. 

## 2018-06-19 ENCOUNTER — Other Ambulatory Visit: Payer: Self-pay | Admitting: Obstetrics and Gynecology

## 2018-06-19 MED ORDER — ORTHO MICRONOR 0.35 MG PO TABS: 1.0000 | ORAL_TABLET | Freq: Every day | ORAL | 0 refills | Status: DC

## 2018-06-19 NOTE — Telephone Encounter (Signed)
Patient called again regarding this prescription, she has been waiting at the pharmacy . She asked if it is possible to have this refilled today.?

## 2018-06-19 NOTE — Telephone Encounter (Signed)
Medication refill request: OCP Last AEX:  08/15/17 BS Next AEX: 08/30/18 Last MMG (if hormonal medication request): 09/13/17 BIRADS 1 negative/density c Refill authorized: 08/15/17 #3 w/3 refills; today please advise. Order has been adjusted to last patient until her AEX.

## 2018-06-19 NOTE — Telephone Encounter (Signed)
Patient is asking for a refill of her birth control [facility] control to [facility]  @ [address]. She states that she has been out of her prescriptionfor two days. She asked if this could be done today.

## 2018-06-20 ENCOUNTER — Telehealth: Payer: Self-pay | Admitting: Obstetrics and Gynecology

## 2018-06-20 MED ORDER — NORETHINDRONE 0.35 MG PO TABS: 1.0000 | ORAL_TABLET | Freq: Every day | ORAL | 0 refills | Status: DC

## 2018-06-20 NOTE — Telephone Encounter (Signed)
Patient says [facility] on [address] [facility] [place]. Would like a prescription sent there if possible. Patient is also concerned with taking 3 pills today if she get the prescription.

## 2018-06-20 NOTE — Telephone Encounter (Signed)
Call returned to patient, no answer, no voicemail to leave message.   [facility] to [facility], spoke with [facility].  Rx for Ortho Micronor sent on 06/19/18 cancelled.  New RX for [facility], brand or generic, #[id number] to [facility] on [address].

## 2018-06-20 NOTE — Telephone Encounter (Signed)
Patient calling regarding birth control prescription. [facility]or and would like authorization on a replacement. States they cannot send via fax and require call from nurse. [facility] [address] Phone: [phone].

## 2018-06-22 NOTE — Telephone Encounter (Signed)
Spoke with patient, notified of RX. Patient was able to pick up generic of Micronor, started on Tuesday. Patient missed pill on Sunday and Monday. Patient was advised by pharmacist not to take missed pills, start will new pack. Patient reports her menses started on 1/20, not currently SA. Advised to use BUM for 7 days, may have some irregular bleeding due to missed pills. Patient thankful for f/u.  Routing to provider for final review. Patient is agreeable to disposition. Will close encounter.

## 2018-06-30 ENCOUNTER — Ambulatory Visit: Payer: 59 | Admitting: Adult Health

## 2018-06-30 ENCOUNTER — Encounter: Payer: Self-pay | Admitting: Adult Health

## 2018-06-30 VITALS — BP 120/84 | Temp 98.3°F | Wt 212.0 lb

## 2018-06-30 DIAGNOSIS — J018 Other acute sinusitis: Secondary | ICD-10-CM

## 2018-06-30 NOTE — Progress Notes (Signed)
Subjective:    Patient ID: Linda Conrad, female    DOB: 18-Jul-1975, 43 y.o.   MRN: [id number]  Sinusitis  This is a new problem. The current episode started in the past 7 days. The problem has been gradually improving since onset. There has been no fever. Associated symptoms include congestion, headaches, sinus pressure, sneezing and a sore throat. Pertinent negatives include no chills, ear pain or shortness of breath. Treatments tried: flonase and saline spray  The treatment provided mild relief.      Review of Systems  Constitutional: Negative for activity change, appetite change, chills, fatigue and fever.  HENT: Positive for congestion, rhinorrhea, sinus pressure, sneezing and sore throat. Negative for ear pain, postnasal drip, sinus pain and trouble swallowing.   Respiratory: Negative for shortness of breath.   Cardiovascular: Negative.   Gastrointestinal: Negative.   Musculoskeletal: Negative.   Skin: Negative.   Neurological: Positive for headaches.  Psychiatric/Behavioral: Negative.    Past Medical History:  Diagnosis Date  . Abnormal Pap smear of cervix - hpv per report in 2014, seeing gyn 11/16/2013   normal pap with gyn 2015  . Arthritis    hand  . Diabetes mellitus without complication ([place])   . Foreign body of hand, right 07/2011   right MP  . GERD (gastroesophageal reflux disease)   . Headache(784.0)    sinus  . Hypertension    denies HTN, but takes Hyzaar   . Stenosing tenosynovitis of wrist 07/2011   right    Social History   Socioeconomic History  . Marital status: Single    Spouse name: Not on file  . Number of children: 0  . Years of education: Not on file  . Highest education level: Not on file  Occupational History  . Not on file  Social Needs  . Financial resource strain: Not on file  . Food insecurity:    Worry: Not on file    Inability: Not on file  . Transportation needs:    Medical: Not on file    Non-medical: Not on file  Tobacco  Use  . Smoking status: Never Smoker  . Smokeless tobacco: Never Used  Substance and Sexual Activity  . Alcohol use: Yes    Alcohol/week: 0.0 standard drinks    Comment: occasionally  . Drug use: No  . Sexual activity: Never    Birth control/protection: Pill    Comment: Micronor  Lifestyle  . Physical activity:    Days per week: Not on file    Minutes per session: Not on file  . Stress: Not on file  Relationships  . Social connections:    Talks on phone: Not on file    Gets together: Not on file    Attends religious service: Not on file    Active member of club or organization: Not on file    Attends meetings of clubs or organizations: Not on file    Relationship status: Not on file  . Intimate partner violence:    Fear of current or ex partner: Not on file    Emotionally abused: Not on file    Physically abused: Not on file    Forced sexual activity: Not on file  Other Topics Concern  . Not on file  Social History Narrative   Work or School: works with senior center      Home Situation: lives alone      Spiritual Beliefs: none, Christian      Lifestyle: active at  work but not otherwise; avoiding carbs             Past Surgical History:  Procedure Laterality Date  . DORSAL COMPARTMENT RELEASE  08/26/2011   Procedure: RELEASE DORSAL COMPARTMENT (DEQUERVAIN);  Surgeon: Cammie Sickle., MD;  Location: [facility];  Service: Orthopedics;  Laterality: Right;  . FOREIGN BODY REMOVAL  08/26/2011   Procedure: FOREIGN BODY REMOVAL ADULT;  Surgeon: Cammie Sickle., MD;  Location: [place];  Service: Orthopedics;  Laterality: Right;    Family History  Problem Relation Age of Onset  . Hyperlipidemia Mother   . Hypertension Mother   . Hypertension Father   . Stroke Father   . Breast cancer Paternal Grandmother        Age 6's  . Diabetes Mellitus II Paternal Grandmother   . Stroke Paternal Grandfather   . Hypertension Sister   .  Heart failure Maternal Grandfather   . Ovarian cancer Paternal Aunt        Age 69/58   . Ovarian cancer Other        Great Maternal Aunt   . Ovarian cancer Cousin 25       mother's brother's daughter, ovarian     Allergies  Allergen Reactions  . Oak Bark [Qu[place]us [place] trees  . Soap Rash    Current Outpatient Medications on File Prior to Visit  Medication Sig Dispense Refill  . acetaminophen (TYLENOL) 650 MG CR tablet Take 650 mg by mouth every 8 (eight) hours as needed for pain.    . Ascorbic Acid (VITAMIN C PO) Take by mouth daily.    . cholecalciferol (VITAMIN D) 1000 units tablet Take 1,000 Units by mouth daily.    . fluticasone (FLONASE) 50 MCG/ACT nasal spray Place into both nostrils daily.    Marland Kitchen FOLIC ACID PO Take 536 mg by mouth daily.    . hydrochlorothiazide (HYDRODIURIL) 25 MG tablet Take 1 tablet (25 mg total) by mouth daily. 90 tablet 1  . losartan (COZAAR) 50 MG tablet Take 1 tablet (50 mg total) by mouth daily. 90 tablet 1  . Meloxicam 15 MG TBDP Take by mouth daily as needed.    . metFORMIN (GLUCOPHAGE) 1000 MG tablet Take 1 tablet (1,000 mg total) by mouth 2 (two) times daily with a meal. 180 tablet 1  . mometasone (ELOCON) 0.1 % cream Apply 1 application topically daily. 45 g 1  . norethindrone (ORTHO MICRONOR) 0.35 MG tablet Take 1 tablet (0.35 mg total) by mouth daily. 3 Package 0  . omeprazole (PRILOSEC) 20 MG capsule TAKE ONE CAPSULE BY MOUTH DAILY 90 capsule 1  . SALINE NASAL SPRAY NA Place into the nose as needed.    . triamcinolone cream (KENALOG) 0.1 % Apply topically daily. 30 g 1   No current facility-administered medications on file prior to visit.     BP 120/84   Temp 98.3 F (36.8 C)   Wt 212 lb (96.2 kg)   BMI 34.22 kg/m       Objective:   Physical Exam Vitals signs and nursing note reviewed.  Constitutional:      Appearance: Normal appearance. She is normal weight.  HENT:     Head: Normocephalic and atraumatic.     Right  Ear: Tympanic membrane, ear canal and external ear normal. There is no impacted cerumen.     Left Ear: Tympanic membrane, ear canal and external ear normal. There is  no impacted cerumen.     Nose: Congestion and rhinorrhea (clear) present. No mucosal edema.     Right Turbinates: Swollen.     Left Turbinates: Swollen.     Right Sinus: No maxillary sinus tenderness or frontal sinus tenderness.     Left Sinus: No maxillary sinus tenderness or frontal sinus tenderness.     Mouth/Throat:     Mouth: Mucous membranes are moist.     Pharynx: Oropharynx is clear. No oropharyngeal exudate or posterior oropharyngeal erythema.  Eyes:     Conjunctiva/sclera: Conjunctivae normal.     Pupils: Pupils are equal, round, and reactive to light.  Cardiovascular:     Rate and Rhythm: Normal rate and regular rhythm.     Pulses: Normal pulses.     Heart sounds: Normal heart sounds.  Pulmonary:     Effort: Pulmonary effort is normal.     Breath sounds: Normal breath sounds.  Abdominal:     General: Abdomen is flat.     Palpations: Abdomen is soft.  Skin:    General: Skin is warm and dry.  Neurological:     General: No focal deficit present.     Mental Status: She is alert.  Psychiatric:        Mood and Affect: Mood normal.        Thought Content: Thought content normal.        Judgment: Judgment normal.       Assessment & Plan:  1. Acute non-recurrent sinusitis of other sinus - Appears to be viral in nature  - Continue with nasal sprays  - rest and stay hydrated - Follow up if not resolved in the next 2-3 days   Dorothyann Peng, AGNP

## 2018-07-03 DIAGNOSIS — H6983 Other specified disorders of Eustachian tube, bilateral: Secondary | ICD-10-CM | POA: Diagnosis not present

## 2018-07-03 DIAGNOSIS — H5213 Myopia, bilateral: Secondary | ICD-10-CM | POA: Diagnosis not present

## 2018-07-03 DIAGNOSIS — E119 Type 2 diabetes mellitus without complications: Secondary | ICD-10-CM | POA: Diagnosis not present

## 2018-07-03 DIAGNOSIS — B029 Zoster without complications: Secondary | ICD-10-CM | POA: Diagnosis not present

## 2018-07-03 DIAGNOSIS — Z7984 Long term (current) use of oral hypoglycemic drugs: Secondary | ICD-10-CM | POA: Diagnosis not present

## 2018-07-04 ENCOUNTER — Ambulatory Visit: Payer: 59 | Admitting: Family Medicine

## 2018-07-05 NOTE — Progress Notes (Signed)
HPI:  Using dictation device. Unfortunately this device frequently misinterprets words/phrases.  Acute visit for lesion in nose: -was seeing ENT for this and per notes was dx with vestibulitis in summer 2019 and treated with mupirocin -today reports: report lesion in the R nares for a few weeks, mild discomfort, now just outside nares as well, saw ENT and she was advised to come here and was told may be herpes - no testing or treatment done -denies:fever, malaise, eye pain, vision changes, lesions elsewhere  Due for diabetic foot exam and eye exam  ROS: See pertinent positives and negatives per HPI.  Past Medical History:  Diagnosis Date  . Abnormal Pap smear of cervix - hpv per report in 2014, seeing gyn 11/16/2013   normal pap with gyn 2015  . Arthritis    hand  . Diabetes mellitus without complication ([place])   . Foreign body of hand, right 07/2011   right MP  . GERD (gastroesophageal reflux disease)   . Headache(784.0)    sinus  . Hypertension    denies HTN, but takes Hyzaar   . Stenosing tenosynovitis of wrist 07/2011   right    Past Surgical History:  Procedure Laterality Date  . DORSAL COMPARTMENT RELEASE  08/26/2011   Procedure: RELEASE DORSAL COMPARTMENT (DEQUERVAIN);  Surgeon: Cammie Sickle., MD;  Location: [facility];  Service: Orthopedics;  Laterality: Right;  . FOREIGN BODY REMOVAL  08/26/2011   Procedure: FOREIGN BODY REMOVAL ADULT;  Surgeon: Cammie Sickle., MD;  Location: [place];  Service: Orthopedics;  Laterality: Right;    Family History  Problem Relation Age of Onset  . Hyperlipidemia Mother   . Hypertension Mother   . Hypertension Father   . Stroke Father   . Breast cancer Paternal Grandmother        Age 20's  . Diabetes Mellitus II Paternal Grandmother   . Stroke Paternal Grandfather   . Hypertension Sister   . Heart failure Maternal Grandfather   . Ovarian cancer Paternal Aunt        Age 62/58   .  Ovarian cancer Other        Great Maternal Aunt   . Ovarian cancer Cousin 29       mother's brother's daughter, ovarian     SOCIAL HX: see hpi   Current Outpatient Medications:  .  acetaminophen (TYLENOL) 650 MG CR tablet, Take 650 mg by mouth every 8 (eight) hours as needed for pain., Disp: , Rfl:  .  Ascorbic Acid (VITAMIN C PO), Take by mouth daily., Disp: , Rfl:  .  cholecalciferol (VITAMIN D) 1000 units tablet, Take 1,000 Units by mouth daily., Disp: , Rfl:  .  fluticasone (FLONASE) 50 MCG/ACT nasal spray, Place into both nostrils daily., Disp: , Rfl:  .  FOLIC ACID PO, Take 024 mg by mouth daily., Disp: , Rfl:  .  hydrochlorothiazide (HYDRODIURIL) 25 MG tablet, Take 1 tablet (25 mg total) by mouth daily., Disp: 90 tablet, Rfl: 1 .  losartan (COZAAR) 50 MG tablet, Take 1 tablet (50 mg total) by mouth daily., Disp: 90 tablet, Rfl: 1 .  Meloxicam 15 MG TBDP, Take by mouth daily as needed., Disp: , Rfl:  .  metFORMIN (GLUCOPHAGE) 1000 MG tablet, Take 1 tablet (1,000 mg total) by mouth 2 (two) times daily with a meal. (Patient taking differently: Take 500 mg by mouth 2 (two) times daily with a meal. ), Disp: 180 tablet, Rfl: 1 .  mometasone (ELOCON) 0.1 % cream, Apply 1 application topically daily., Disp: 45 g, Rfl: 1 .  Multiple Vitamins-Minerals (ZINC PO), Take by mouth., Disp: , Rfl:  .  norethindrone (ORTHO MICRONOR) 0.35 MG tablet, Take 1 tablet (0.35 mg total) by mouth daily., Disp: 3 Package, Rfl: 0 .  omeprazole (PRILOSEC) 20 MG capsule, TAKE ONE CAPSULE BY MOUTH DAILY, Disp: 90 capsule, Rfl: 1 .  SALINE NASAL SPRAY NA, Place into the nose as needed., Disp: , Rfl:  .  triamcinolone cream (KENALOG) 0.1 %, Apply topically daily., Disp: 30 g, Rfl: 1 .  valACYclovir (VALTREX) 1000 MG tablet, Take 1 tablet (1,000 mg total) by mouth 3 (three) times daily., Disp: 14 tablet, Rfl: 0  EXAM:  Vitals:   07/06/18 1026  BP: 116/82  Pulse: 83  Temp: 98.7 F (37.1 C)    Body mass index  is 34.8 kg/m.  GENERAL: vitals reviewed and listed above, alert, oriented, appears well hydrated and in no acute distress  HEENT: atraumatic, conjunttiva clear, few papules just outside R nares, small area irritation, dried blood, crusting R nares ant septum  NECK: no obvious masses on inspection  LUNGS: clear to auscultation bilaterally, no wheezes, rales or rhonchi, good air movement  CV: HRRR, no peripheral edema  MS: moves all extremities without noticeable abnormality  PSYCH: pleasant and cooperative, no obvious depression or anxiety  ASSESSMENT AND PLAN:  Discussed the following assessment and plan:  Non-healing skin lesion of nose  -viral culture obtain -tx with valtrex given ENT concerns - no vesicles today and discuss potential for poor response given duration symptoms - discussed potential complications, risks if herpes/shingles - she feels is improving, agrees to follow up promptly if any worsening or spreading -derm referral if persists -follow up 2-3 weeks, sooner as needed -Patient advised to return or notify a doctor immediately if symptoms worsen or persist or new concerns arise.  Patient Instructions  BEFORE YOU LEAVE: -viral culture -follow up: 2-3 weeks for recheck  Take the Valtrex per instructions.  Follow up sooner if worsening, new concerns, spreading or other symptoms.  Seek care immediately if fevers, malaise, spreading toward eye or concerns.   Lucretia Kern, DO

## 2018-07-06 ENCOUNTER — Encounter: Payer: Self-pay | Admitting: Family Medicine

## 2018-07-06 ENCOUNTER — Ambulatory Visit: Payer: 59 | Admitting: Family Medicine

## 2018-07-06 VITALS — BP 116/82 | HR 83 | Temp 98.7°F | Ht 66.0 in | Wt 215.6 lb

## 2018-07-06 DIAGNOSIS — L989 Disorder of the skin and subcutaneous tissue, unspecified: Secondary | ICD-10-CM | POA: Diagnosis not present

## 2018-07-06 MED ORDER — VALACYCLOVIR HCL 1 G PO TABS: 1000.0000 mg | ORAL_TABLET | Freq: Three times a day (TID) | ORAL | 0 refills | Status: DC

## 2018-07-06 NOTE — Patient Instructions (Signed)
BEFORE YOU LEAVE: -viral culture -follow up: 2-3 weeks for recheck  Take the Valtrex per instructions.  Follow up sooner if worsening, new concerns, spreading or other symptoms.  Seek care immediately if fevers, malaise, spreading toward eye or concerns.

## 2018-07-06 NOTE — Addendum Note (Signed)
Addended by: Lahoma Crocker A on: 07/06/2018 11:30 AM   Modules accepted: Orders

## 2018-07-10 LAB — VIRAL CULTURE VIRC
MICRO NUMBER:: 162326
SPECIMEN QUALITY:: ADEQUATE

## 2018-07-11 ENCOUNTER — Telehealth: Payer: Self-pay | Admitting: Family Medicine

## 2018-07-11 NOTE — Telephone Encounter (Signed)
Linda Conrad., Customer Service Representative from [facility] call to give a report of fluid culture collected on 07/04/18. I advised it was already resulted by the provider and the patient given the results. See result note 07/11/18 by Dr. Maudie Mercury.

## 2018-07-20 ENCOUNTER — Ambulatory Visit: Payer: 59 | Admitting: Family Medicine

## 2018-07-20 ENCOUNTER — Encounter: Payer: Self-pay | Admitting: Family Medicine

## 2018-07-20 VITALS — BP 120/72 | HR 91 | Temp 98.4°F | Ht 66.0 in | Wt 219.1 lb

## 2018-07-20 DIAGNOSIS — I10 Essential (primary) hypertension: Secondary | ICD-10-CM | POA: Diagnosis not present

## 2018-07-20 DIAGNOSIS — E1159 Type 2 diabetes mellitus with other circulatory complications: Secondary | ICD-10-CM | POA: Diagnosis not present

## 2018-07-20 DIAGNOSIS — E119 Type 2 diabetes mellitus without complications: Secondary | ICD-10-CM | POA: Diagnosis not present

## 2018-07-20 DIAGNOSIS — B009 Herpesviral infection, unspecified: Secondary | ICD-10-CM

## 2018-07-20 DIAGNOSIS — E669 Obesity, unspecified: Secondary | ICD-10-CM | POA: Diagnosis not present

## 2018-07-20 DIAGNOSIS — I152 Hypertension secondary to endocrine disorders: Secondary | ICD-10-CM

## 2018-07-20 LAB — CBC
HCT: 35.3 % — ABNORMAL LOW (ref 36.0–46.0)
Hemoglobin: 11.8 g/dL — ABNORMAL LOW (ref 12.0–15.0)
MCHC: 33.3 g/dL (ref 30.0–36.0)
MCV: 85.5 fl (ref 78.0–100.0)
Platelets: 290 10*3/uL (ref 150.0–400.0)
RBC: 4.13 Mil/uL (ref 3.87–5.11)
RDW: 14.2 % (ref 11.5–15.5)
WBC: 4.9 10*3/uL (ref 4.0–10.5)

## 2018-07-20 LAB — BASIC METABOLIC PANEL
BUN: 15 mg/dL (ref 6–23)
CALCIUM: 8.8 mg/dL (ref 8.4–10.5)
CO2: 29 meq/L (ref 19–32)
Chloride: 100 mEq/L (ref 96–112)
Creatinine, Ser: 0.84 mg/dL (ref 0.40–1.20)
GFR: 89.14 mL/min (ref >60.00)
Glucose, Bld: 193 mg/dL — ABNORMAL HIGH (ref 70–99)
Potassium: 4 mEq/L (ref 3.5–5.1)
Sodium: 136 mEq/L (ref 135–145)

## 2018-07-20 LAB — HEMOGLOBIN A1C: Hgb A1c MFr Bld: 6.8 % — ABNORMAL HIGH (ref 4.6–6.5)

## 2018-07-20 MED ORDER — VALACYCLOVIR HCL 500 MG PO TABS: 500.0000 mg | ORAL_TABLET | Freq: Every day | ORAL | 3 refills | Status: DC

## 2018-07-20 NOTE — Progress Notes (Addendum)
HPI:  Using dictation device. Unfortunately this device frequently misinterprets words/phrases.  Linda Conrad is a pleasant 43 y.o. here for follow up. Chronic medical problems summarized below were reviewed for changes and stability and were updated as needed below. These issues and their treatment remain stable for the most part.  Nasal lesion healed completely with the valtrex. She is so glad as has been dealing with this on and off for 2 years. Not walking. Worried bloods sugar might be up as not as good with lifestyle. Had diabetes eye exam. No reported CP, SOB, fever, DOE, treatment intolerance or new symptoms. hse has had a little PND, cough since URI. Will be seeing allergist soon. Due for labs, foot exam CPE 03/21/2018  Herpes Simplex: -recurrent nasal lesion - was seeing ENT for this and they treated with mupirocin -she came here when did not resolve -culture showed herpes simplex  Diabetes mellitus: -meds: metformin 1000bid, arb -wants to eventually stop medications- prefers not to take medications, not on statin (refused)  HTN/Obesity: -meds: losartan-hctz  GERD: -meds: prilosecas needed -stable  Anemia: -sees gyn for heavy menstrual bleeding -stool cards and anemia panel normal -she saw hematologist 2019 given her anxiety regarding this - per note felt related to menstrual cycles, doing genetic screening forFH   ROS: See pertinent positives and negatives per HPI.  Past Medical History:  Diagnosis Date  . Abnormal Pap smear of cervix - hpv per report in 2014, seeing gyn 11/16/2013   normal pap with gyn 2015  . Arthritis    hand  . Diabetes mellitus without complication ([place])   . Foreign body of hand, right 07/2011   right MP  . GERD (gastroesophageal reflux disease)   . Headache(784.0)    sinus  . Hypertension    denies HTN, but takes Hyzaar   . Stenosing tenosynovitis of wrist 07/2011   right    Past Surgical History:  Procedure Laterality  Date  . DORSAL COMPARTMENT RELEASE  08/26/2011   Procedure: RELEASE DORSAL COMPARTMENT (DEQUERVAIN);  Surgeon: Cammie Sickle., MD;  [facility];  Service: Orthopedics;  Laterality: Right;  . FOREIGN BODY REMOVAL  08/26/2011   Procedure: FOREIGN BODY REMOVAL ADULT;  Surgeon: Cammie Sickle., MD;  Location: [place];  Service: Orthopedics;  Laterality: Right;    Family History  Problem Relation Age of Onset  . Hyperlipidemia Mother   . Hypertension Mother   . Hypertension Father   . Stroke Father   . Breast cancer Paternal Grandmother        Age 66's  . Diabetes Mellitus II Paternal Grandmother   . Stroke Paternal Grandfather   . Hypertension Sister   . Heart failure Maternal Grandfather   . Ovarian cancer Paternal Aunt        Age 26/58   . Ovarian cancer Other        Great Maternal Aunt   . Ovarian cancer Cousin 49       mother's brother's daughter, ovarian     SOCIAL HX: see hpi   Current Outpatient Medications:  .  acetaminophen (TYLENOL) 650 MG CR tablet, Take 650 mg by mouth every 8 (eight) hours as needed for pain., Disp: , Rfl:  .  Ascorbic Acid (VITAMIN C PO), Take by mouth daily., Disp: , Rfl:  .  cholecalciferol (VITAMIN D) 1000 units tablet, Take 1,000 Units by mouth daily., Disp: , Rfl:  .  fluticasone (FLONASE) 50 MCG/ACT nasal spray,  Place into both nostrils daily., Disp: , Rfl:  .  FOLIC ACID PO, Take 614 mg by mouth daily., Disp: , Rfl:  .  hydrochlorothiazide (HYDRODIURIL) 25 MG tablet, Take 1 tablet (25 mg total) by mouth daily., Disp: 90 tablet, Rfl: 1 .  losartan (COZAAR) 50 MG tablet, Take 1 tablet (50 mg total) by mouth daily., Disp: 90 tablet, Rfl: 1 .  Meloxicam 15 MG TBDP, Take by mouth daily as needed., Disp: , Rfl:  .  metFORMIN (GLUCOPHAGE) 1000 MG tablet, Take 1 tablet (1,000 mg total) by mouth 2 (two) times daily with a meal. (Patient taking differently: Take 500 mg by mouth 2 (two) times daily with a  meal. ), Disp: 180 tablet, Rfl: 1 .  mometasone (ELOCON) 0.1 % cream, Apply 1 application topically daily., Disp: 45 g, Rfl: 1 .  Multiple Vitamins-Minerals (ZINC PO), Take by mouth., Disp: , Rfl:  .  norethindrone (ORTHO MICRONOR) 0.35 MG tablet, Take 1 tablet (0.35 mg total) by mouth daily., Disp: 3 Package, Rfl: 0 .  omeprazole (PRILOSEC) 20 MG capsule, TAKE ONE CAPSULE BY MOUTH DAILY, Disp: 90 capsule, Rfl: 1 .  SALINE NASAL SPRAY NA, Place into the nose as needed., Disp: , Rfl:  .  triamcinolone cream (KENALOG) 0.1 %, Apply topically daily., Disp: 30 g, Rfl: 1 .  valACYclovir (VALTREX) 500 MG tablet, Take 1 tablet (500 mg total) by mouth daily., Disp: 90 tablet, Rfl: 3  EXAM:  Vitals:   07/20/18 1304  BP: 120/72  Pulse: 91  Temp: 98.4 F (36.9 C)    Body mass index is 35.36 kg/m.  GENERAL: vitals reviewed and listed above, alert, oriented, appears well hydrated and in no acute distress  HEENT: atraumatic, conjunttiva clear, no obvious abnormalities on inspection of external nose and ears  NECK: no obvious masses on inspection  LUNGS: clear to auscultation bilaterally, no wheezes, rales or rhonchi, good air movement  CV: HRRR, no peripheral edema  MS: moves all extremities without noticeable abnormality  PSYCH: pleasant and cooperative, no obvious depression or anxiety  ASSESSMENT AND PLAN:  Discussed the following assessment and plan:  Type 2 diabetes mellitus without complication, without long-term current use of insulin (HCC) - Plan: Hemoglobin A1c  Hypertension associated with diabetes ([place]) - Plan: Basic metabolic panel, CBC  Obesity (BMI 30.0-34.9)  Herpes simplex  -discussed tx options, implications, transmission, risks for herpes simplex; she opted for suppressive treatment given recurrent -foot exam done -eye exam advised -labs per orders -lifetsyle recs -advised nasal saline for pnd along with allergy regimen and allergy eval as planned, follow up  promptly if worsening, sinus pain, sob, fevers, etc or not improving. -follow up 3-4 months, sooner as needed  Patient Instructions  BEFORE YOU LEAVE: -labs today -follow up: can reschedule follow up in April to in 3-4 months since seen today (let her know it was not a physical)  Start the valtrex and take once daily.  We have ordered labs or studies at this visit. It can take up to 1-2 weeks for results and processing. IF results require follow up or explanation, we will call you with instructions. Clinically stable results will be released to your [facility]. If you have not heard from [place] or cannot find your results in [facility] in 2 weeks please contact our office at [phone].  If you are not yet signed up for [facility], please consider signing up.   We recommend the following healthy lifestyle for LIFE: 1) Small portions. But, make  sure to get regular (at least 3 per day), healthy meals and small healthy snacks if needed.  2) Eat a healthy clean diet.   TRY TO EAT: -at least 5-7 servings of low sugar, colorful, and nutrient rich vegetables per day (not corn, potatoes or bananas.) -berries are the best choice if you wish to eat fruit (only eat small amounts if trying to reduce weight)  -lean meets (fish, white meat of chicken or [place]) -vegan proteins for some meals - beans or tofu, whole grains, nuts and seeds -Replace bad fats with good fats - good fats include: fish, nuts and seeds, canola oil, olive oil -small amounts of low fat or non fat dairy -small amounts of100 % whole grains - check the lables -drink plenty of water  AVOID: -SUGAR, sweets, anything with added sugar, corn syrup or sweeteners - must read labels as even foods advertised as "healthy" often are loaded with sugar -if you must have a sweetener, small amounts of stevia may be best -sweetened beverages and artificially sweetened beverages -simple starches (rice, bread, potatoes, pasta, chips, etc - small amounts of  100% whole grains are ok) -red meat, pork, butter -fried foods, fast food, processed food, excessive dairy, eggs and coconut.  3)Get at least 150 minutes of sweaty aerobic exercise per week.  4)Reduce stress - consider counseling, meditation and relaxation to balance other aspects of your life.          Lucretia Kern, DO

## 2018-07-20 NOTE — Patient Instructions (Signed)
BEFORE YOU LEAVE: -labs today -follow up: can reschedule follow up in April to in 3-4 months since seen today (let her know it was not a physical)  Start the valtrex and take once daily.  We have ordered labs or studies at this visit. It can take up to 1-2 weeks for results and processing. IF results require follow up or explanation, we will call you with instructions. Clinically stable results will be released to your [facility]. If you have not heard from [place] or cannot find your results in [facility] in 2 weeks please contact our office at [phone].  If you are not yet signed up for [facility], please consider signing up.   We recommend the following healthy lifestyle for LIFE: 1) Small portions. But, make sure to get regular (at least 3 per day), healthy meals and small healthy snacks if needed.  2) Eat a healthy clean diet.   TRY TO EAT: -at least 5-7 servings of low sugar, colorful, and nutrient rich vegetables per day (not corn, potatoes or bananas.) -berries are the best choice if you wish to eat fruit (only eat small amounts if trying to reduce weight)  -lean meets (fish, white meat of chicken or [place]) -vegan proteins for some meals - beans or tofu, whole grains, nuts and seeds -Replace bad fats with good fats - good fats include: fish, nuts and seeds, canola oil, olive oil -small amounts of low fat or non fat dairy -small amounts of100 % whole grains - check the lables -drink plenty of water  AVOID: -SUGAR, sweets, anything with added sugar, corn syrup or sweeteners - must read labels as even foods advertised as "healthy" often are loaded with sugar -if you must have a sweetener, small amounts of stevia may be best -sweetened beverages and artificially sweetened beverages -simple starches (rice, bread, potatoes, pasta, chips, etc - small amounts of 100% whole grains are ok) -red meat, pork, butter -fried foods, fast food, processed food, excessive dairy, eggs and coconut.  3)Get  at least 150 minutes of sweaty aerobic exercise per week.  4)Reduce stress - consider counseling, meditation and relaxation to balance other aspects of your life.

## 2018-07-27 DIAGNOSIS — J301 Allergic rhinitis due to pollen: Secondary | ICD-10-CM | POA: Diagnosis not present

## 2018-07-27 DIAGNOSIS — J3081 Allergic rhinitis due to animal (cat) (dog) hair and dander: Secondary | ICD-10-CM | POA: Diagnosis not present

## 2018-07-27 DIAGNOSIS — J3089 Other allergic rhinitis: Secondary | ICD-10-CM | POA: Diagnosis not present

## 2018-08-01 DIAGNOSIS — D2372 Other benign neoplasm of skin of left lower limb, including hip: Secondary | ICD-10-CM | POA: Diagnosis not present

## 2018-08-01 DIAGNOSIS — L723 Sebaceous cyst: Secondary | ICD-10-CM | POA: Diagnosis not present

## 2018-08-24 ENCOUNTER — Other Ambulatory Visit: Payer: Self-pay | Admitting: Family Medicine

## 2018-08-29 ENCOUNTER — Other Ambulatory Visit: Payer: Self-pay | Admitting: Obstetrics and Gynecology

## 2018-08-29 MED ORDER — NORETHINDRONE 0.35 MG PO TABS: 1.0000 | ORAL_TABLET | Freq: Every day | ORAL | 0 refills | Status: DC

## 2018-08-29 NOTE — Telephone Encounter (Signed)
Patient requesting a refill on birth control. [facility] on [address]. [phone]

## 2018-08-29 NOTE — Telephone Encounter (Signed)
Medication refill request: OCP Last AEX:  08/15/17 Dr. Quincy Simmonds  Next AEX: none Last MMG (if hormonal medication request): 09/13/17 [id number]:Neg  Refill authorized: 06/20/18 #3packs/0R. Today #3packs/0R?

## 2018-08-30 ENCOUNTER — Ambulatory Visit: Payer: 59 | Admitting: Obstetrics and Gynecology

## 2018-08-30 ENCOUNTER — Other Ambulatory Visit: Payer: Self-pay | Admitting: Obstetrics and Gynecology

## 2018-08-30 NOTE — Telephone Encounter (Signed)
rx did go thru. rx for today to be cancelled & note to pharmacy stating it was sent yesterday.

## 2018-08-30 NOTE — Telephone Encounter (Signed)
Medication refill request: Norlyda Last AEX:  08-15-17 Next AEX: aex was 08-30-2018, cancelled due to covid-19 Last MMG (if hormonal medication request): 09-13-17 neg Refill authorized: please approve if appropriate

## 2018-08-30 NOTE — Telephone Encounter (Signed)
Rx was filled yesterday to this pharmacy.  Please check to see if it went through.  Thank you.

## 2018-09-04 ENCOUNTER — Ambulatory Visit: Payer: 59 | Admitting: Family Medicine

## 2018-09-26 ENCOUNTER — Telehealth: Payer: Self-pay | Admitting: Nurse Practitioner

## 2018-09-26 NOTE — Telephone Encounter (Signed)
Rescheduled 5/13 appt per sch msg. Called patient. No answer, mailbox full.

## 2018-10-03 ENCOUNTER — Encounter: Payer: 59 | Admitting: Family Medicine

## 2018-10-11 ENCOUNTER — Ambulatory Visit: Payer: 59 | Admitting: Nurse Practitioner

## 2018-10-11 ENCOUNTER — Other Ambulatory Visit: Payer: 59

## 2018-10-12 ENCOUNTER — Other Ambulatory Visit: Payer: Self-pay

## 2018-10-12 ENCOUNTER — Other Ambulatory Visit: Payer: Self-pay | Admitting: Podiatry

## 2018-10-12 ENCOUNTER — Encounter: Payer: Self-pay | Admitting: Podiatry

## 2018-10-12 ENCOUNTER — Ambulatory Visit (INDEPENDENT_AMBULATORY_CARE_PROVIDER_SITE_OTHER): Payer: 59

## 2018-10-12 ENCOUNTER — Ambulatory Visit: Payer: 59 | Admitting: Podiatry

## 2018-10-12 VITALS — BP 126/76 | Temp 97.9°F

## 2018-10-12 DIAGNOSIS — M205X1 Other deformities of toe(s) (acquired), right foot: Secondary | ICD-10-CM | POA: Diagnosis not present

## 2018-10-12 DIAGNOSIS — M722 Plantar fascial fibromatosis: Secondary | ICD-10-CM

## 2018-10-12 DIAGNOSIS — M7989 Other specified soft tissue disorders: Secondary | ICD-10-CM | POA: Diagnosis not present

## 2018-10-12 DIAGNOSIS — M79671 Pain in right foot: Secondary | ICD-10-CM

## 2018-10-12 NOTE — Progress Notes (Signed)
Subjective:   Patient ID: Linda Conrad, female   DOB: 43 y.o.   MRN: [id number]   HPI 43 year old female presents the office today for concerns of a "knot" on the top of her right second finger for last 2 weeks.  There is painful with pressure in shoes.  She denies recent injury or trauma.  She said no recent treatment.  She states that she does not wear tight shoes so she is not sure why it started.  She also has secondary concerns of some discomfort the office aspect the left foot.  She said that she sprained her ankle about 2 years ago and since then she has noticed a popping/cracking sensation at times when she moves her ankle.  She had no recent treatment with this.  She had no treatment when she first sprained her ankle.    Review of Systems  All other systems reviewed and are negative.  Past Medical History:  Diagnosis Date  . Abnormal Pap smear of cervix - hpv per report in 2014, seeing gyn 11/16/2013   normal pap with gyn 2015  . Arthritis    hand  . Diabetes mellitus without complication ([place])   . Foreign body of hand, right 07/2011   right MP  . GERD (gastroesophageal reflux disease)   . Headache(784.0)    sinus  . Hypertension    denies HTN, but takes Hyzaar   . Stenosing tenosynovitis of wrist 07/2011   right    Past Surgical History:  Procedure Laterality Date  . DORSAL COMPARTMENT RELEASE  08/26/2011   Procedure: RELEASE DORSAL COMPARTMENT (DEQUERVAIN);  Surgeon: Cammie Sickle., MD;  Location: [facility];  Service: Orthopedics;  Laterality: Right;  . FOREIGN BODY REMOVAL  08/26/2011   Procedure: FOREIGN BODY REMOVAL ADULT;  Surgeon: Cammie Sickle., MD;  Location: [place];  Service: Orthopedics;  Laterality: Right;     Current Outpatient Medications:  .  acetaminophen (TYLENOL) 650 MG CR tablet, Take 650 mg by mouth every 8 (eight) hours as needed for pain., Disp: , Rfl:  .  Ascorbic Acid (VITAMIN C PO), Take by mouth  daily., Disp: , Rfl:  .  cholecalciferol (VITAMIN D) 1000 units tablet, Take 1,000 Units by mouth daily., Disp: , Rfl:  .  fluticasone (FLONASE) 50 MCG/ACT nasal spray, Place into both nostrils daily., Disp: , Rfl:  .  FOLIC ACID PO, Take 301 mg by mouth daily., Disp: , Rfl:  .  hydrochlorothiazide (HYDRODIURIL) 25 MG tablet, Take 1 tablet (25 mg total) by mouth daily., Disp: 90 tablet, Rfl: 1 .  losartan (COZAAR) 50 MG tablet, Take 1 tablet (50 mg total) by mouth daily., Disp: 90 tablet, Rfl: 1 .  Meloxicam 15 MG TBDP, Take by mouth daily as needed., Disp: , Rfl:  .  metFORMIN (GLUCOPHAGE) 1000 MG tablet, Take 1 tablet (1,000 mg total) by mouth 2 (two) times daily with a meal. (Patient taking differently: Take 500 mg by mouth 2 (two) times daily with a meal. ), Disp: 180 tablet, Rfl: 1 .  mometasone (ELOCON) 0.1 % cream, Apply 1 application topically daily., Disp: 45 g, Rfl: 1 .  Multiple Vitamins-Minerals (ZINC PO), Take by mouth., Disp: , Rfl:  .  norethindrone (ORTHO MICRONOR) 0.35 MG tablet, Take 1 tablet (0.35 mg total) by mouth daily., Disp: 3 Package, Rfl: 0 .  omeprazole (PRILOSEC) 20 MG capsule, TAKE ONE CAPSULE BY MOUTH DAILY, Disp: 30 capsule, Rfl: 5 .  SALINE NASAL SPRAY NA, Place into the nose as needed., Disp: , Rfl:  .  triamcinolone cream (KENALOG) 0.1 %, Apply topically daily., Disp: 30 g, Rfl: 1 .  valACYclovir (VALTREX) 500 MG tablet, Take 1 tablet (500 mg total) by mouth daily., Disp: 90 tablet, Rfl: 3  Allergies  Allergen Reactions  . Oak Bark [[place] trees  . Soap Rash        Objective:  Physical Exam  General: AAO x3, NAD  Dermatological: Skin is warm, dry and supple bilateral. Nails x 10 are well manicured; remaining integument appears unremarkable at this time. There are no open sores, no preulcerative lesions, no rash or signs of infection present.  Vascular: Dorsalis Pedis artery and Posterior Tibial artery pedal pulses are 2/4 bilateral with  immedate capillary fill time. Pedal hair growth present. No varicosities and no lower extremity edema present bilateral. There is no pain with calf compression, swelling, warmth, erythema.   Neruologic: Grossly intact via light touch bilateral.  Protective threshold with Semmes Wienstein monofilament intact to all pedal sites bilateral.   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  There is a soft tissue prominence/knot.  There is no fluid present today.  There is no erythema or warmth.  Mild tenderness palpation along the prominence.  There is mild discomfort on the left, sinus tarsi.  There is no crepitation identified today no popping sensation.  No area pinpoint tenderness.  No pain to the lateral ankle ligaments.  Gait: Unassisted, Nonantalgic.     Assessment:   43 year old female likely cyst right second toe; capsulitis left subtalar joint    Plan:  -Treatment options discussed including all alternatives, risks, and complications -Etiology of symptoms were discussed -X-rays were obtained and reviewed with the patient.  No evidence of acute fracture, stress fracture or foreign body present. -Steroid injection performed for soft tissue mass on the right second toe.  The skin was prepped with alcohol.  Next quarter cc dexamethasone phosphate, Marcaine plain was infiltrated into the soft tissue Complications.  She tolerated well postinjection flare was discussed. -Ice to the area -In general discussed supportive shoes, arch supports.  Trula Slade DPM

## 2018-10-24 ENCOUNTER — Encounter: Payer: 59 | Admitting: Family Medicine

## 2018-10-25 ENCOUNTER — Other Ambulatory Visit: Payer: Self-pay

## 2018-10-25 ENCOUNTER — Encounter: Payer: Self-pay | Admitting: Family Medicine

## 2018-10-25 ENCOUNTER — Ambulatory Visit (INDEPENDENT_AMBULATORY_CARE_PROVIDER_SITE_OTHER): Payer: 59 | Admitting: Family Medicine

## 2018-10-25 ENCOUNTER — Telehealth: Payer: Self-pay | Admitting: *Deleted

## 2018-10-25 DIAGNOSIS — E1159 Type 2 diabetes mellitus with other circulatory complications: Secondary | ICD-10-CM | POA: Diagnosis not present

## 2018-10-25 DIAGNOSIS — K219 Gastro-esophageal reflux disease without esophagitis: Secondary | ICD-10-CM | POA: Diagnosis not present

## 2018-10-25 DIAGNOSIS — B009 Herpesviral infection, unspecified: Secondary | ICD-10-CM

## 2018-10-25 DIAGNOSIS — E119 Type 2 diabetes mellitus without complications: Secondary | ICD-10-CM | POA: Diagnosis not present

## 2018-10-25 DIAGNOSIS — N920 Excessive and frequent menstruation with regular cycle: Secondary | ICD-10-CM

## 2018-10-25 DIAGNOSIS — E785 Hyperlipidemia, unspecified: Secondary | ICD-10-CM

## 2018-10-25 DIAGNOSIS — E1169 Type 2 diabetes mellitus with other specified complication: Secondary | ICD-10-CM

## 2018-10-25 DIAGNOSIS — I152 Hypertension secondary to endocrine disorders: Secondary | ICD-10-CM

## 2018-10-25 DIAGNOSIS — I10 Essential (primary) hypertension: Secondary | ICD-10-CM

## 2018-10-25 MED ORDER — CYANOCOBALAMIN 2000 MCG PO TABS: 2000.0000 ug | ORAL_TABLET | Freq: Every day | ORAL | Status: DC

## 2018-10-25 MED ORDER — METFORMIN HCL 500 MG PO TABS: 500.0000 mg | ORAL_TABLET | Freq: Two times a day (BID) | ORAL | 1 refills | Status: DC

## 2018-10-25 NOTE — Progress Notes (Signed)
Virtual Visit via Video Note  I connected with Linda Conrad  on 10/25/18 at 11:00 AM EDT by a video enabled telemedicine application and verified that I am speaking with the correct person using two identifiers.  Location patient: home Location provider:work office Persons participating in the virtual visit: patient, provider  I discussed the limitations of evaluation and management by telemedicine and the availability of in person appointments. The patient expressed understanding and agreed to proceed.   Linda Conrad DOB: 05/10/1976 Encounter date: 10/25/2018  This is a 43 y.o. female who presents to establish care. Chief Complaint  Patient presents with  . Establish Care    transfer from Dr Maudie Mercury  . Cyst    noted on right posterior thigh x9 months, seeing dermatologist on 6/2  . Cyst    seen by Dr Jacqualyn Posey at [facility] and had drainage of cyst right 2nd digit x2 weeks ago   Last visit with [facility] was 07/20/18:  History of present illness: Toe has felt better since seeing podiatry. She will see derm next week for "cyst" on thigh.   HTN: hctz 25mg , losartan 50mg . She does not check bp at home.  GERD: omeprazole 20mg  daily. Tries to take when eating trigger food; but sometimes just takes daily. Has not been able to get off of this medication in past. Sx controlled on medication. Added [place] daily.   DMII: metformin 500mg  BID, A1C 07/2018 was 6.8 (up from 6.3 previously). She has been watching what she is eating and drinking more water. Hasn't been working out but has been walking regularly.   Has refused statin in past; wants to be off medications per notes. Has been eating healthier.   Follows with gyn for menorrhagia. She has been clotting a lot when she is menstruating. (Sees Dr. Quincy Simmonds). She does have stronger periods and does get cramping. Has appointment next week with gyn.   Dr. Kalman Shan appointment in July - blood levels were low; they were further evaluating the anemia.     Past Medical History:  Diagnosis Date  . Abnormal Pap smear of cervix - hpv per report in 2014, seeing gyn 11/16/2013   normal pap with gyn 2015  . Arthritis    hand  . Diabetes mellitus without complication ([place])   . Foreign body of hand, right 07/2011   right MP  . GERD (gastroesophageal reflux disease)   . Headache(784.0)    sinus  . Hypertension    denies HTN, but takes Hyzaar   . 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 CYST EXCISION    . DORSAL COMPARTMENT RELEASE  08/26/2011   Procedure: RELEASE DORSAL COMPARTMENT (DEQUERVAIN);  Surgeon: Cammie Sickle., MD;  Location: [facility];  Service: Orthopedics;  Laterality: Right;  . FOREIGN BODY REMOVAL  08/26/2011   Procedure: FOREIGN BODY REMOVAL ADULT;  Surgeon: Cammie Sickle., MD;  Location: [facility];  Service: Orthopedics;  Laterality: Right;   Allergies  Allergen Reactions  . Oak Bark [Quercus [place]ia trees  . Soap Rash   Current Meds  Medication Sig  . acetaminophen (TYLENOL) 650 MG CR tablet Take 650 mg by mouth every 8 (eight) hours as needed for pain.  . Ascorbic Acid (VITAMIN C PO) Take by mouth daily.  . cholecalciferol (VITAMIN D) 1000 units tablet Take 1,000 Units by mouth daily.  . fluticasone (  FLONASE) 50 MCG/ACT nasal spray Place into both nostrils daily.  Marland Kitchen FOLIC ACID PO Take 517 mg by mouth daily.  . hydrochlorothiazide (HYDRODIURIL) 25 MG tablet Take 1 tablet (25 mg total) by mouth daily.  Marland Kitchen losartan (COZAAR) 50 MG tablet Take 1 tablet (50 mg total) by mouth daily.  . Meloxicam 15 MG TBDP Take by mouth daily as needed.  . mometasone (ELOCON) 0.1 % cream Apply 1 application topically daily.  . Multiple Vitamins-Minerals (ZINC PO) Take by mouth.  . norethindrone (ORTHO MICRONOR) 0.35 MG tablet Take 1 tablet (0.35 mg total) by mouth daily.  Marland Kitchen omeprazole (PRILOSEC) 20 MG capsule TAKE ONE CAPSULE  BY MOUTH DAILY  . SALINE NASAL SPRAY NA Place into the nose as needed.  . triamcinolone cream (KENALOG) 0.1 % Apply topically daily.  . valACYclovir (VALTREX) 500 MG tablet Take 1 tablet (500 mg total) by mouth daily.  . [DISCONTINUED] metFORMIN (GLUCOPHAGE) 1000 MG tablet Take 1 tablet (1,000 mg total) by mouth 2 (two) times daily with a meal. (Patient taking differently: Take 500 mg by mouth 2 (two) times daily with a meal. )   Social History   Tobacco Use  . Smoking status: Never Smoker  . Smokeless tobacco: Never Used  Substance Use Topics  . Alcohol use: Yes    Alcohol/week: 0.0 standard drinks    Comment: occasionally   Family History  Problem Relation Age of Onset  . Hyperlipidemia Mother   . Hypertension Mother   . Hypertension Father   . Stroke Father   . Breast cancer Paternal Grandmother        Age 30's  . Diabetes Mellitus II Paternal Grandmother   . Stroke Paternal Grandfather   . Hypertension Sister   . Diabetes Sister   . Arthritis Maternal Grandmother   . Heart failure Maternal Grandfather   . Ovarian cancer Paternal Aunt        Age 73/58   . Ovarian cancer Other        Great Maternal Aunt   . Ovarian cancer Cousin 30       mother's brother's daughter, ovarian      Review of Systems  Constitutional: Negative for chills, fatigue and fever.  Respiratory: Negative for cough, chest tightness, shortness of breath and wheezing.   Cardiovascular: Negative for chest pain, palpitations and leg swelling.    Objective:  There were no vitals taken for this visit.      BP Readings from Last 3 Encounters:  10/12/18 126/76  07/20/18 120/72  07/06/18 116/82   Wt Readings from Last 3 Encounters:  07/20/18 219 lb 1.6 oz (99.4 kg)  07/06/18 215 lb 9.6 oz (97.8 kg)  06/30/18 212 lb (96.2 kg)    EXAM:  GENERAL: alert, oriented, appears well and in no acute distress  HEENT: atraumatic, conjunctiva clear, no obvious abnormalities on inspection of external nose  and ears  NECK: normal movements of the head and neck  LUNGS: on inspection no signs of respiratory distress, breathing rate appears normal, no obvious gross SOB, gasping or wheezing  CV: no obvious cyanosis  MS: moves all visible extremities without noticeable abnormality  PSYCH/NEURO: pleasant and cooperative, no obvious depression or anxiety, speech and thought processing grossly intact  SKIN: no visible abnormalities face  Assessment/Plan  1. Hypertension associated with diabetes ([place]) Has been well controlled; continue current medication - Comprehensive metabolic panel; Future  2. Gastroesophageal reflux disease, esophagitis presence not specified Well controlled; continue to avoid dietary triggers.  3. Type 2 diabetes mellitus without complication, without long-term current use of insulin ([place]) Plan to recheck bloodwork. - Hemoglobin A1c; Future - Microalbumin / creatinine urine ratio; Future - metFORMIN (GLUCOPHAGE) 500 MG tablet; Take 1 tablet (500 mg total) by mouth 2 (two) times daily with a meal.  Dispense: 180 tablet; Refill: 1  4. Hyperlipidemia associated with type 2 diabetes mellitus ([place]) Working on diet/exercise to control lipids - Lipid panel; Future - TSH; Future  5. Menorrhagia with regular cycle Following with gynecology/hematology. - CBC with Differential/Platelet; Future - IBC + Ferritin; Future  6. Herpes Valtrex has resolved nasal lesion.   Return for bloodwork. Last CPE was 03/21/18   I discussed the assessment and treatment plan with the patient. The patient was provided an opportunity to ask questions and all were answered. The patient agreed with the plan and demonstrated an understanding of the instructions.   The patient was advised to call back or seek an in-person evaluation if the symptoms worsen or if the condition fails to improve as anticipated.  I provided 35 minutes of non-face-to-face time during this encounter.   Micheline Rough, MD

## 2018-10-25 NOTE — Telephone Encounter (Signed)
-----   Message from Caren Macadam, MD sent at 10/25/2018 11:34 AM EDT ----- Can you schedule bloodwork please at her convenience

## 2018-10-25 NOTE — Telephone Encounter (Signed)
Unable to leave a message due to voicemail being full. 

## 2018-10-27 ENCOUNTER — Other Ambulatory Visit: Payer: Self-pay | Admitting: Family Medicine

## 2018-10-27 DIAGNOSIS — Z1231 Encounter for screening mammogram for malignant neoplasm of breast: Secondary | ICD-10-CM

## 2018-10-31 ENCOUNTER — Other Ambulatory Visit (HOSPITAL_COMMUNITY)
Admission: RE | Admit: 2018-10-31 | Discharge: 2018-10-31 | Disposition: A | Discharge status: Home / Self Care | Payer: 59 | Source: Ambulatory Visit | Attending: Obstetrics and Gynecology | Admitting: Obstetrics and Gynecology

## 2018-10-31 ENCOUNTER — Encounter: Payer: Self-pay | Admitting: Obstetrics and Gynecology

## 2018-10-31 ENCOUNTER — Ambulatory Visit: Payer: 59 | Admitting: Obstetrics and Gynecology

## 2018-10-31 ENCOUNTER — Other Ambulatory Visit: Payer: Self-pay

## 2018-10-31 VITALS — BP 110/72 | HR 76 | Temp 98.0°F | Resp 14 | Ht 65.5 in | Wt 216.0 lb

## 2018-10-31 DIAGNOSIS — R6882 Decreased libido: Secondary | ICD-10-CM

## 2018-10-31 DIAGNOSIS — Z01419 Encounter for gynecological examination (general) (routine) without abnormal findings: Secondary | ICD-10-CM | POA: Insufficient documentation

## 2018-10-31 DIAGNOSIS — Z23 Encounter for immunization: Secondary | ICD-10-CM | POA: Diagnosis not present

## 2018-10-31 MED ORDER — NORLYDA 0.35 MG PO TABS: 1.0000 | ORAL_TABLET | Freq: Every day | ORAL | 3 refills | Status: DC

## 2018-10-31 NOTE — Patient Instructions (Addendum)
EXERCISE AND DIET:  We recommended that you start or continue a regular exercise program for good health. Regular exercise means any activity that makes your heart beat faster and makes you sweat.  We recommend exercising at least 30 minutes per day at least 3 days a week, preferably 4 or 5.  We also recommend a diet low in fat and sugar.  Inactivity, poor dietary choices and obesity can cause diabetes, heart attack, stroke, and kidney damage, among others.    ALCOHOL AND SMOKING:  Women should limit their alcohol intake to no more than 7 drinks/beers/glasses of wine (combined, not each!) per week. Moderation of alcohol intake to this level decreases your risk of breast cancer and liver damage. And of course, no recreational drugs are part of a healthy lifestyle.  And absolutely no smoking or even second hand smoke. Most people know smoking can cause heart and lung diseases, but did you know it also contributes to weakening of your bones? Aging of your skin?  Yellowing of your teeth and nails?  CALCIUM AND VITAMIN D:  Adequate intake of calcium and Vitamin D are recommended.  The recommendations for exact amounts of these supplements seem to change often, but generally speaking 600 mg of calcium (either carbonate or citrate) and 800 units of Vitamin D per day seems prudent. Certain women may benefit from higher intake of Vitamin D.  If you are among these women, your doctor will have told you during your visit.    PAP SMEARS:  Pap smears, to check for cervical cancer or precancers,  have traditionally been done yearly, although recent scientific advances have shown that most women can have pap smears less often.  However, every woman still should have a physical exam from her gynecologist every year. It will include a breast check, inspection of the vulva and vagina to check for abnormal growths or skin changes, a visual exam of the cervix, and then an exam to evaluate the size and shape of the uterus and  ovaries.  And after 44 years of age, a rectal exam is indicated to check for rectal cancers. We will also provide age appropriate advice regarding health maintenance, like when you should have certain vaccines, screening for sexually transmitted diseases, bone density testing, colonoscopy, mammograms, etc.   MAMMOGRAMS:  All women over 40 years old should have a yearly mammogram. Many facilities now offer a "3D" mammogram, which may cost around $50 extra out of pocket. If possible,  we recommend you accept the option to have the 3D mammogram performed.  It both reduces the number of women who will be called back for extra views which then turn out to be normal, and it is better than the routine mammogram at detecting truly abnormal areas.    COLONOSCOPY:  Colonoscopy to screen for colon cancer is recommended for all women at age 50.  We know, you hate the idea of the prep.  We agree, BUT, having colon cancer and not knowing it is worse!!  Colon cancer so often starts as a polyp that can be seen and removed at colonscopy, which can quite literally save your life!  And if your first colonoscopy is normal and you have no family history of colon cancer, most women don't have to have it again for 10 years.  Once every ten years, you can do something that may end up saving your life, right?  We will be happy to help you get it scheduled when you are ready.    Be sure to check your insurance coverage so you understand how much it will cost.  It may be covered as a preventative service at no cost, but you should check your particular policy.     HPV (Human Papillomavirus) Vaccine: What You Need to Know 1. Why get vaccinated? HPV vaccine prevents infection with human papillomavirus (HPV) types that are associated with many cancers, including:  cervical cancer in females,  vaginal and vulvar cancers in females,  anal cancer in females and males,  throat cancer in females and males, and  penile cancer in  males. In addition, HPV vaccine prevents infection with HPV types that cause genital warts in both females and males. In the [place] about 12,000 women get cervical cancer every year, and about 4,000 women die from it. HPV vaccine can prevent most of these cases of cervical cancer. Vaccination is not a substitute for cervical cancer screening. This vaccine does not protect against all HPV types that can cause cervical cancer. Women should still get regular Pap tests. HPV infection usually comes from sexual contact, and most people will become infected at some point in their life. About 14 million Americans, including teens, get infected every year. Most infections will go away on their own and not cause serious problems. But thousands of women and men get cancer and other diseases from HPV. 2. HPV vaccine HPV vaccine is approved by FDA and is recommended by CDC for both males and females. It is routinely given at 51 or 44 years of age, but it may be given beginning at age 13 years through age 85 years. Most adolescents 9 through 44 years of age should get HPV vaccine as a two-dose series with the doses separated by 6-12 months. People who start HPV vaccination at 57 years of age and older should get the vaccine as a three-dose series with the second dose given 1-2 months after the first dose and the third dose given 6 months after the first dose. There are several exceptions to these age recommendations. Your health care provider can give you more information. 3. Some people should not get this vaccine  Anyone who has had a severe (life-threatening) allergic reaction to a dose of HPV vaccine should not get another dose.  Anyone who has a severe (life threatening) allergy to any component of HPV vaccine should not get the vaccine. ? Tell your doctor if you have any severe allergies that you know of, including a severe allergy to yeast.  HPV vaccine is not recommended for pregnant women. If you learn that  you were pregnant when you were vaccinated, there is no reason to expect any problems for you or your baby. Any woman who learns she was pregnant when she got HPV vaccine is encouraged to contact the manufacturer's registry for HPV vaccination during pregnancy at 731-297-6463. Women who are breastfeeding may be vaccinated.  If you have a mild illness, such as a cold, you can probably get the vaccine today. If you are moderately or severely ill, you should probably wait until you recover. Your doctor can advise you. 4. Risks of a vaccine reaction With any medicine, including vaccines, there is a chance of side effects. These are usually mild and go away on their own, but serious reactions are also possible. Most people who get HPV vaccine do not have any serious problems with it. Mild or moderate problems following HPV vaccine:  Reactions in the arm where the shot was given: ? Soreness (about 9  people in 10) ? Redness or swelling (about 1 person in 3)  Fever: ? Mild (100F) (about 1 person in 10) ? Moderate (102F) (about 1 person in 70)  Other problems: ? Headache (about 1 person in 3) Problems that could happen after any injected vaccine:  People sometimes faint after a medical procedure, including vaccination. Sitting or lying down for about 15 minutes can help prevent fainting, and injuries caused by a fall. Tell your doctor if you feel dizzy, or have vision changes or ringing in the ears.  Some people get severe pain in the shoulder and have difficulty moving the arm where a shot was given. This happens very rarely.  Any medication can cause a severe allergic reaction. Such reactions from a vaccine are very rare, estimated at about 1 in a million doses, and would happen within a few minutes to a few hours after the vaccination. As with any medicine, there is a very remote chance of a vaccine causing a serious injury or death. The safety of vaccines is always being monitored. For more  information, visit: http://www.aguilar.org/. 5. What if there is a serious reaction? What should I look for? Look for anything that concerns you, such as signs of a severe allergic reaction, very high fever, or unusual behavior. Signs of a severe allergic reaction can include hives, swelling of the face and throat, difficulty breathing, a fast heartbeat, dizziness, and weakness. These would usually start a few minutes to a few hours after the vaccination. What should I do? If you think it is a severe allergic reaction or other emergency that can't wait, call 9-1-1 or get to the nearest hospital. Otherwise, call your doctor. Afterward, the reaction should be reported to the Vaccine Adverse Event Reporting System (VAERS). Your doctor should file this report, or you can do it yourself through the VAERS web site at www.vaers.SamedayNews.es, or by calling 8562121942. VAERS does not give medical advice. 6. The National Vaccine Injury Compensation Program The [employer] Vaccine Injury Compensation Program (VICP) is a federal program that was created to compensate people who may have been injured by certain vaccines. Persons who believe they may have been injured by a vaccine can learn about the program and about filing a claim by calling (747)379-1537 or visiting the [place] website at GoldCloset.com.ee. There is a time limit to file a claim for compensation. 7. How can I learn more?  Ask your health care provider. He or she can give you the vaccine package insert or suggest other sources of information.  Call your local or state health department.  Contact the Centers for Disease Control and Prevention (CDC): ? Call 810-109-4227 (1-800-CDC-INFO) or ? Visit CDC's website at http://sweeney-todd.com/ Vaccine Information Statement HPV Vaccine (05/02/2015) This information is not intended to replace advice given to you by your health care provider. Make sure you discuss any questions you have with  your health care provider. Document Released: 12/12/2013 Document Revised: 12/27/2017 Document Reviewed: 12/27/2017 Elsevier Interactive Patient Education  2019 [employer].

## 2018-10-31 NOTE — Progress Notes (Signed)
43 y.o. G0P0000 Single African American female here for annual exam.    Having her menses monthly.  Bleeds for 5 days. Has clotting.  Pad change every time she goes to the bathroom, 3 - 4 times a day.  Not staining through clothing or bed sheets.  Her menses come one week early for the last couple of months.  States she has some cramping even when she is not on her cycle.  Normal pelvic US 07/2017.   Decreased sex drive.  Labs with PCP.  She will also follow up with the cancer center for her anemia work up.   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   PCP: Colin Benton, DO    Patient's last menstrual period was 10/25/2018.           Sexually active: No.  Has a boyfriend for 2 years.  The current method of family planning is oral progesterone-only contraceptive.    Exercising: Yes.    walking Smoker:  no  Health Maintenance: Pap:  12/09/15,Negative with neg HR HPV.  Pap and HR HPV both negative 05/21/14. History of abnormal Pap:  Yes, hx HPV on prior pap 2014. F/u paps normal MMG:  09/14/17 BIRADS 1 negative/density c -- scheduled 12/14/18 TBC TDaP:  03/19/13 Gardasil:   n/a HIV: 05/21/14 Negative Hep C: never Screening Labs: PCP and oncology   reports that she has never smoked. She has never used smokeless tobacco. She reports current alcohol use. She reports that she does not use drugs.  Past Medical History:  Diagnosis Date  . Abnormal Pap smear of cervix - hpv per report in 2014, seeing gyn 11/16/2013   normal pap with gyn 2015  . Arthritis    hand  . Diabetes mellitus without complication ([place])   . Foreign body of hand, right 07/2011   right MP  . GERD (gastroesophageal reflux disease)   . Headache(784.0)    sinus  . HSV-1 (herpes simplex virus 1) infection   . Hypertension    denies HTN, but takes Hyzaar   . Stenosing tenosynovitis of wrist 07/2011   right    Past Surgical History:  Procedure Laterality Date  . CYST EXCISION    . DORSAL COMPARTMENT RELEASE  08/26/2011    Procedure: RELEASE DORSAL COMPARTMENT (DEQUERVAIN);  Surgeon: Cammie Sickle., MD;  Location: [facility];  Service: Orthopedics;  Laterality: Right;  . FOREIGN BODY REMOVAL  08/26/2011   Procedure: FOREIGN BODY REMOVAL ADULT;  Surgeon: Cammie Sickle., MD;  Location: [facility];  Service: Orthopedics;  Laterality: Right;    Current Outpatient Medications  Medication Sig Dispense Refill  . acetaminophen (TYLENOL) 650 MG CR tablet Take 650 mg by mouth every 8 (eight) hours as needed for pain.    . Ascorbic Acid (VITAMIN C PO) Take by mouth daily.    . cholecalciferol (VITAMIN D) 1000 units tablet Take 1,000 Units by mouth daily.    . cyanocobalamin (CVS VITAMIN B12) 2000 MCG tablet Take 1 tablet (2,000 mcg total) by mouth daily.    . fluticasone (FLONASE) 50 MCG/ACT nasal spray Place into both nostrils daily.    . hydrochlorothiazide (HYDRODIURIL) 25 MG tablet Take 1 tablet (25 mg total) by mouth daily. 90 tablet 1  . losartan (COZAAR) 50 MG tablet Take 1 tablet (50 mg total) by mouth daily. 90 tablet 1  . metFORMIN (GLUCOPHAGE) 500 MG tablet Take 1 tablet (500 mg total) by mouth 2 (two) times daily with  a meal. (Patient taking differently: Take 500 mg by mouth daily. ) 180 tablet 1  . mometasone (ELOCON) 0.1 % cream Apply 1 application topically daily. 45 g 1  . Multiple Vitamins-Minerals (ZINC PO) Take by mouth.    . NORLYDA 0.35 MG tablet Take 1 tablet (0.35 mg total) by mouth daily. 3 Package 3  . omeprazole (PRILOSEC) 20 MG capsule TAKE ONE CAPSULE BY MOUTH DAILY 30 capsule 5  . SALINE NASAL SPRAY NA Place into the nose as needed.    . triamcinolone cream (KENALOG) 0.1 % Apply topically daily. 30 g 1  . valACYclovir (VALTREX) 500 MG tablet Take 1 tablet (500 mg total) by mouth daily. 90 tablet 3   No current facility-administered medications for this visit.     Family History  Problem Relation Age of Onset  . Hyperlipidemia Mother   .  Hypertension Mother   . Hypertension Father   . Stroke Father   . Breast cancer Paternal Grandmother        Age 58's  . Diabetes Mellitus II Paternal Grandmother   . Stroke Paternal Grandfather   . Hypertension Sister   . Diabetes Sister   . Arthritis Maternal Grandmother   . Heart failure Maternal Grandfather   . Ovarian cancer Paternal Aunt        Age 58/58   . Ovarian cancer Other        Great Maternal Aunt   . Ovarian cancer Cousin 88       mother's brother's daughter, ovarian     Review of Systems  Constitutional:       Lethargic   HENT: Negative.   Eyes: Negative.   Respiratory: Negative.   Cardiovascular: Negative.   Gastrointestinal: Negative.   Endocrine: Negative.   Genitourinary:       Low libido  Musculoskeletal: Negative.   Skin: Negative.   Allergic/Immunologic: Negative.   Neurological: Negative.   Hematological: Negative.   Psychiatric/Behavioral: Negative.     Exam:   BP 110/72 (BP Location: Right Arm, Patient Position: Sitting, Cuff Size: Large)   Pulse 76   Temp 98 F (36.7 C) (Temporal)   Resp 14   Ht 5' 5.5" (1.664 m)   Wt 216 lb (98 kg)   LMP 10/25/2018   BMI 35.40 kg/m     General appearance: alert, cooperative and appears stated age Head: normocephalic, without obvious abnormality, atraumatic Neck: no adenopathy, supple, symmetrical, trachea midline and thyroid normal to inspection and palpation Lungs: clear to auscultation bilaterally Breasts: normal appearance, no masses or tenderness, No nipple retraction or dimpling, No nipple discharge or bleeding, No axillary adenopathy Heart: regular rate and rhythm Abdomen: soft, non-tender; no masses, no organomegaly Extremities: extremities normal, atraumatic, no cyanosis or edema Skin: skin color, texture, turgor normal. No rashes or lesions Lymph nodes: cervical, supraclavicular, and axillary nodes normal. Neurologic: grossly normal  Pelvic: External genitalia:  no lesions               No abnormal inguinal nodes palpated.              Urethra:  normal appearing urethra with no masses, tenderness or lesions              Bartholins and Skenes: normal                 Vagina: normal appearing vagina with normal color and discharge, no lesions  Cervix: no lesions.  Mild menstrual flow.               Pap taken: Yes.   Bimanual Exam:  Uterus:  normal size, contour, position, consistency, mobility, non-tender              Adnexa: no mass, fullness, tenderness              Rectal exam: Yes.  .  Confirms.              Anus:  normal sphincter tone, no lesions  Chaperone was present for exam.  Assessment:   Well woman visit with normal exam. Pelvic cramping treated with Micronor.  Hx HPV.  Follow up paps normal and negative.  DM.  HTN.  HSV.  Decreased libido.   Plan: Mammogram screening discussed. Self breast awareness reviewed. Pap and HR HPV as above. Guidelines for Calcium, Vitamin D, regular exercise program including cardiovascular and weight bearing exercise. Start Gardasil series.  We discussed the multiple contributors to decreased libido.  Check testosterone level.  I did mention testosterone tx.  Refill of Micronor x 1 year.  I did mention progesterone IUD as an alternative to Micronor. Follow up annually and prn.   After visit summary provided.

## 2018-11-02 ENCOUNTER — Telehealth: Payer: Self-pay | Admitting: Hematology

## 2018-11-02 LAB — CYTOLOGY - PAP
Diagnosis: NEGATIVE
HPV 16/18/45 genotyping: POSITIVE — AB
HPV: DETECTED — AB

## 2018-11-02 NOTE — Telephone Encounter (Signed)
LB AM PAL 7/13 moved f/u to PM. Not able to reach patient by phone or leave message due to voicemail full. Schedule mailed. Patient also my chart active.

## 2018-11-05 ENCOUNTER — Other Ambulatory Visit: Payer: Self-pay | Admitting: Obstetrics and Gynecology

## 2018-11-05 LAB — TESTOSTERONE, FREE, DIRECT
Testosterone, Free: 2.5 pg/mL (ref 0.0–4.2)
Testosterone, Total, LC/MS: 28.1 ng/dL

## 2018-11-07 ENCOUNTER — Other Ambulatory Visit: Payer: Self-pay | Admitting: Family Medicine

## 2018-11-15 ENCOUNTER — Other Ambulatory Visit: Payer: Self-pay | Admitting: *Deleted

## 2018-11-15 DIAGNOSIS — R87810 Cervical high risk human papillomavirus (HPV) DNA test positive: Secondary | ICD-10-CM

## 2018-11-17 ENCOUNTER — Telehealth: Payer: Self-pay | Admitting: Obstetrics and Gynecology

## 2018-11-17 NOTE — Telephone Encounter (Signed)
Spoke with patient and conveyed benefits for colposcopy. Patient understands/agreeable with benefits. Patient is aware of cancellation policy.

## 2018-11-20 ENCOUNTER — Telehealth: Payer: Self-pay | Admitting: Obstetrics and Gynecology

## 2018-11-20 NOTE — Telephone Encounter (Signed)
Call returned to patient. Colpo rescheduled for 6/30 at 11am with Dr. Quincy Simmonds. Patient verbalizes understanding.   Routing to provider for final review. Patient is agreeable to disposition. Will close encounter.  Cc: Lerry Liner, Magdalene Patricia

## 2018-11-20 NOTE — Telephone Encounter (Signed)
Patient is scheduled for colpo Wednesday and period came on with clotting this morning.

## 2018-11-20 NOTE — Telephone Encounter (Signed)
Spoke with patient. Patient is scheduled for Colpo on 6/24, menses started on 6/21, needs to reschedule. Menses typically last for 5 days.  OCP for contraceptive.   Colpo cancelled for 6/24, advised will need to review schedule with nursing supervisor and return call, patient agreeable.

## 2018-11-22 ENCOUNTER — Ambulatory Visit: Payer: 59 | Admitting: Obstetrics and Gynecology

## 2018-11-24 ENCOUNTER — Other Ambulatory Visit: Payer: Self-pay

## 2018-11-28 ENCOUNTER — Ambulatory Visit: Payer: 59 | Admitting: Obstetrics and Gynecology

## 2018-11-28 ENCOUNTER — Encounter: Payer: Self-pay | Admitting: Obstetrics and Gynecology

## 2018-11-28 ENCOUNTER — Other Ambulatory Visit: Payer: Self-pay

## 2018-11-28 ENCOUNTER — Ambulatory Visit: Payer: Self-pay

## 2018-11-28 VITALS — BP 116/62 | HR 100 | Temp 97.2°F | Resp 14 | Wt 214.0 lb

## 2018-11-28 DIAGNOSIS — R87810 Cervical high risk human papillomavirus (HPV) DNA test positive: Secondary | ICD-10-CM | POA: Diagnosis not present

## 2018-11-28 DIAGNOSIS — Z01812 Encounter for preprocedural laboratory examination: Secondary | ICD-10-CM | POA: Diagnosis not present

## 2018-11-28 LAB — POCT URINE PREGNANCY: Preg Test, Ur: NEGATIVE

## 2018-11-28 NOTE — Progress Notes (Signed)
   Subjective:     Patient ID: Frazier Butt, female   DOB: 1976/05/20, 43 y.o.   MRN: [id number]  HPI  Pap History: 10/31/18 Neg:Pos HR HPV, Pos Subtype 16; 7/11/17Neg:Neg HR HPV; 05/21/14 Neg:Neg HR HPV; hx HPV on prior pap 2014  Patient states that she has taken 2 Tylenol tablets around 10 am  Has a lot of pressure right before her period.  She is currently using Micronor generic.  States she did much better on name brand. With her cycle, she does not have any significant discomfort.  Review of Systems LMP: 11/19/18 Contraception: Norlyda UPT: negative.  Gardasil vaccine:  First one done.      Objective:   Physical Exam Genitourinary:     Colposcopy - cervix, vagina. Consent for procedure.  3% acetic acid used in vagina. White light and green light filter used.  Colposcopy satisfactory:  Yes   __x___          No    _____ Findings:    Cervix:  acetowhite change versus squamous metaplasia at 6:00.  Vagina: no lesions.  Biopsies:   ECC, 6:00. Monsel's placed.  Minimal EBL. No complications.  Some difficulty tolerating vaginal speculum for procedure today.      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  Difficulty tolerating speculum exam.     Plan:     FU biopsies.  Final plan to follow.  If needs a LEEP, I would recommend doing in an outpatient surgical setting.    After visit summary to patient.

## 2018-11-28 NOTE — Patient Instructions (Signed)
Colposcopy, Care After This sheet gives you information about how to care for yourself after your procedure. Your doctor may also give you more specific instructions. If you have problems or questions, contact your doctor. What can I expect after the procedure? If you did not have a tissue sample removed (did not have a biopsy), you may only have some spotting for a few days. You can go back to your normal activities. If you had a tissue sample removed, it is common to have:  Soreness and pain. This may last for a few days.  Light-headedness.  Mild bleeding from your vagina or dark-colored, grainy discharge from your vagina. This may last for a few days. You may need to wear a sanitary pad.  Spotting for at least 48 hours after the procedure. Follow these instructions at home:   Take over-the-counter and prescription medicines only as told by your doctor. Ask your doctor what medicines you can start taking again. This is very important if you take blood-thinning medicine.  Do not drive or use heavy machinery while taking prescription pain medicine.  For 3 days, or as long as your doctor tells you, avoid: ? Douching. ? Using tampons. ? Having sex.  If you use birth control (contraception), keep using it.  Limit activity for the first day after the procedure. Ask your doctor what activities are safe for you.  It is up to you to get the results of your procedure. Ask your doctor when your results will be ready.  Keep all follow-up visits as told by your doctor. This is important. Contact a doctor if:  You get a skin rash. Get help right away if:  You are bleeding a lot from your vagina. It is a lot of bleeding if you are using more than one pad an hour for 2 hours in a row.  You have clumps of blood (blood clots) coming from your vagina.  You have a fever.  You have chills  You have pain in your lower belly (pelvic area).  You have signs of infection, such as vaginal  discharge that is: ? Different than usual. ? Yellow. ? Bad-smelling.  You have very pain or cramps in your lower belly that do not get better with medicine.  You feel light-headed.  You feel dizzy.  You pass out (faint). Summary  If you did not have a tissue sample removed (did not have a biopsy), you may only have some spotting for a few days. You can go back to your normal activities.  If you had a tissue sample removed, it is common to have mild pain and spotting for 48 hours.  For 3 days, or as long as your doctor tells you, avoid douching, using tampons and having sex.  Get help right away if you have bleeding, very bad pain, or signs of infection. This information is not intended to replace advice given to you by your health care provider. Make sure you discuss any questions you have with your health care provider. Document Released: 11/03/2007 Document Revised: 04/29/2017 Document Reviewed: 02/04/2016 Elsevier Patient Education  2020 Elsevier Inc.  

## 2018-12-04 ENCOUNTER — Telehealth: Payer: Self-pay | Admitting: *Deleted

## 2018-12-04 ENCOUNTER — Encounter: Payer: Self-pay | Admitting: Obstetrics and Gynecology

## 2018-12-04 NOTE — Telephone Encounter (Signed)
-----   Message from Nunzio Cobbs, MD sent at 12/04/2018 12:34 PM EDT ----- Please report results of colposcopic biopsy.  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  No treatment is needed.  Please place her in 08 recall.

## 2018-12-04 NOTE — Telephone Encounter (Signed)
Call to patient to review results. Unable to leave message. Voice mail box full.

## 2018-12-05 NOTE — Telephone Encounter (Signed)
Patient returned call

## 2018-12-05 NOTE — Telephone Encounter (Signed)
Call to patient. Patient states now is not a good time to talk- will need to return call.

## 2018-12-10 NOTE — Progress Notes (Signed)
[facility]   [phone] [phone]   Clinic Follow up Note   Patient Care Team: Caren Macadam, MD as PCP - General (Family Medicine) Rutherford Guys, MD as Consulting Physician (Ophthalmology) Yisroel Ramming, Everardo All, MD as Consulting Physician (Obstetrics and Gynecology) 12/11/2018  CHIEF COMPLAINT: f/u anemia of iron deficiency and chronic disease    CURRENT THERAPY: None  INTERVAL HISTORY: Ms. Kates returns for f/u as scheduled. She was last seen 02/08/18 on her first visit since transferring from Dr. Clydene Laming care. Her CBC was normal at that time. We recommended multivitamin with iron, but she has not been taking. She also stopped vitamin D and B12 due to constipation. She feels well in general. Had a biopsy on her cervix recently which was normal. Mammogram is scheduled this week. Appetite is normal. Denies bleeding. LMP 11/19/18. She has heavy bleeding on day 1 then tapers off over 5 days. Denies fever, chills, cough, chest pain, leg edema. Exertional dyspnea is stable. She tries to exercise with walking. 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    MEDICAL HISTORY:  Past Medical History:  Diagnosis Date   Abnormal Pap smear of cervix - hpv per report in 2014, seeing gyn 11/16/2013   normal pap with gyn 2015   Arthritis    hand   CIN I (cervical intraepithelial neoplasia I) 2020   Diabetes mellitus without complication ([place])    Foreign body of hand, right 07/2011   right MP   GERD (gastroesophageal reflux disease)    Headache(784.0)    sinus   HSV-1 (herpes simplex virus 1) infection    Hypertension    denies HTN, but takes Hyzaar    Stenosing tenosynovitis of wrist 07/2011   right    SURGICAL HISTORY: Past Surgical History:  Procedure Laterality Date   CYST EXCISION     DORSAL COMPARTMENT RELEASE  08/26/2011   Procedure: RELEASE DORSAL COMPARTMENT (DEQUERVAIN);  Surgeon:  Cammie Sickle., MD;  Location[facility];  Service: Orthopedics;  Laterality: Right;   FOREIGN BODY REMOVAL  08/26/2011   Procedure: FOREIGN BODY REMOVAL ADULT;  Surgeon: Cammie Sickle., MD;  Location: [facility];  Service: Orthopedics;  Laterality: Right;    I have reviewed the social history and family history with the patient and they are unchanged from previous note.  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  MEDICATIONS:  Current Outpatient Medications  Medication Sig Dispense Refill   acetaminophen (TYLENOL) 650 MG CR tablet Take 650 mg by mouth every 8 (eight) hours as needed for pain.     Ascorbic Acid (VITAMIN C PO) Take by mouth daily.     fluticasone (FLONASE) 50 MCG/ACT nasal spray Place into both nostrils daily.     hydrochlorothiazide (HYDRODIURIL) 25 MG tablet TAKE 1 TABLET BY MOUTH DAILY 90 tablet 1   losartan (COZAAR) 50 MG tablet TAKE 1 TABLET BY MOUTH DAILY 90 tablet 1   metFORMIN (GLUCOPHAGE) 500 MG tablet Take 1 tablet (500 mg total) by mouth 2 (two) times daily with a meal. 180 tablet 1   mometasone (ELOCON) 0.1 % cream Apply 1 application topically daily. 45 g 1   Multiple Vitamins-Minerals (ZINC PO) Take by mouth.     NORLYDA 0.35 MG tablet Take 1 tablet (0.35 mg total) by mouth daily. 3 Package 3   omeprazole (PRILOSEC) 20 MG capsule TAKE ONE  CAPSULE BY MOUTH DAILY 30 capsule 5   SALINE NASAL SPRAY NA Place into the nose as needed.     triamcinolone cream (KENALOG) 0.1 % Apply topically daily. 30 g 1   valACYclovir (VALTREX) 500 MG tablet Take 1 tablet (500 mg total) by mouth daily. 90 tablet 3   cholecalciferol (VITAMIN D) 1000 units tablet Take 1,000 Units by mouth daily.     cyanocobalamin (CVS VITAMIN B12) 2000 MCG tablet Take 1 tablet (2,000 mcg total) by mouth daily.     No current facility-administered medications for this visit.     PHYSICAL EXAMINATION: ECOG PERFORMANCE  STATUS: 0 - Asymptomatic  Vitals:   12/11/18 1259  BP: 121/75  Pulse: 99  Resp: 20  Temp: 99.1 F (37.3 C)  SpO2: 100%   Filed Weights   12/11/18 1259  Weight: 212 lb 12.8 oz (96.5 kg)    GENERAL:alert, no distress and comfortable SKIN: no obvious rash  EYES:  sclera clear LUNGS: respirations even and unlabored  HEART:  no lower extremity edema Musculoskeletal:no cyanosis of digits  NEURO: alert & oriented x 3 with fluent speech, normal gait Limited exam for covid19 outbreak   LABORATORY DATA:  I have reviewed the data as listed CBC Latest Ref Rng & Units 12/11/2018 07/20/2018 02/08/2018  WBC 4.0 - 10.5 K/uL 5.2 4.9 5.9  Hemoglobin 12.0 - 15.0 g/dL 12.9 11.8(L) 11.8  Hematocrit 36.0 - 46.0 % 39.2 35.3(L) 36.9  Platelets 150 - 400 K/uL 198 290.0 297     CMP Latest Ref Rng & Units 07/20/2018 03/21/2018 10/06/2017  Glucose 70 - 99 mg/dL 193(H) 73 95  BUN 6 - 23 mg/dL 15 19 11   Creatinine 0.40 - 1.20 mg/dL 0.84 0.90 0.80  Sodium 135 - 145 mEq/L 136 135 138  Potassium 3.5 - 5.1 mEq/L 4.0 3.6 4.2  Chloride 96 - 112 mEq/L 100 99 105  CO2 19 - 32 mEq/L 29 27 27   Calcium 8.4 - 10.5 mg/dL 8.8 10.0 9.1  Total Protein 6.4 - 8.3 g/dL - - -  Total Bilirubin 0.2 - 1.2 mg/dL - - -  Alkaline Phos 40 - 150 U/L - - -  AST 5 - 34 U/L - - -  ALT 0 - 55 U/L - - -      RADIOGRAPHIC STUDIES: I have personally reviewed the radiological images as listed and agreed with the findings in the report. No results found.   ASSESSMENT & PLAN: AHLEY BULLS is a pleasant 43 year old female with intermittent mild anemia, DM, HTN, and GERD  1. Normocytic, normochromic anemia  -Initially seen by me and Dr. Burr Medico in 01/2018 for chronic mild intermittent normocytic, normochromic anemia, no other cytopenias; no evidence of hemolysis, B12 deficiency, or severe iron deficiency. She reportedly had negative FOBT in the past  -Iron studies in 07/2017 were in low-normal range, Dr. Burr Medico recommends to take  multivitamin with iron daily but patient did not start iron supplement -Her mild anemia and iron deficiency are felt to be related to blood loss from her menses; she does not require aggressive iron replacement with IV Feraheme  -Her mild anemia may also have component of chronic disease related to her DM, although this is controlled  -Ms. Hornbaker appears well today. CBC and iron studies are normal. Anemia resolved. She does not require additional iron supplement at this time -she will return for lab and f/u in 1 year  2. Family history of cancer -She has multiple family  members with cancer, including ovarian cancer on maternal and paternal lineage and breast cancer on paternal side -we previously reviewed indications for genetic testing and she agreed to meet our genetic counselor; I referred her but she did not make an appointment -she agrees to be referred again today.  -she is due annual mammogram this week and continues routine gyn f/u. She recently had colposcopy that showed low grade dysplasia, ECC is benign.   PLAN: -Labs reviewed, anemia resolved. No additional iron supplement needed  -Lab, f/u in 1 year  -Refer back to genetics for family history of breast and GYN malignancy  -Proceed with mammogram 7/16 -Continue f/u with GYN, PCP   Orders Placed This Encounter  Procedures   Ambulatory referral to Genetics    Referral Priority:   Routine    Referral Type:   Consultation    Referral Reason:   Specialty Services Required    Number of Visits Requested:   1    All questions were answered. The patient knows to call the clinic with any problems, questions or concerns. No barriers to learning was detected.     Alla Feeling, NP 12/11/18

## 2018-12-11 ENCOUNTER — Other Ambulatory Visit: Payer: Self-pay

## 2018-12-11 ENCOUNTER — Other Ambulatory Visit: Payer: 59

## 2018-12-11 ENCOUNTER — Encounter: Payer: Self-pay | Admitting: Nurse Practitioner

## 2018-12-11 ENCOUNTER — Ambulatory Visit: Payer: 59 | Admitting: Nurse Practitioner

## 2018-12-11 ENCOUNTER — Telehealth: Payer: Self-pay | Admitting: Nurse Practitioner

## 2018-12-11 ENCOUNTER — Inpatient Hospital Stay: Payer: 59 | Attending: Nurse Practitioner

## 2018-12-11 ENCOUNTER — Inpatient Hospital Stay (HOSPITAL_BASED_OUTPATIENT_CLINIC_OR_DEPARTMENT_OTHER): Payer: 59 | Admitting: Nurse Practitioner

## 2018-12-11 VITALS — BP 121/75 | HR 99 | Temp 99.1°F | Resp 20 | Ht 65.5 in | Wt 212.8 lb

## 2018-12-11 DIAGNOSIS — E119 Type 2 diabetes mellitus without complications: Secondary | ICD-10-CM | POA: Insufficient documentation

## 2018-12-11 DIAGNOSIS — I10 Essential (primary) hypertension: Secondary | ICD-10-CM | POA: Diagnosis not present

## 2018-12-11 DIAGNOSIS — D649 Anemia, unspecified: Secondary | ICD-10-CM

## 2018-12-11 DIAGNOSIS — Z8041 Family history of malignant neoplasm of ovary: Secondary | ICD-10-CM | POA: Insufficient documentation

## 2018-12-11 DIAGNOSIS — D509 Iron deficiency anemia, unspecified: Secondary | ICD-10-CM | POA: Diagnosis not present

## 2018-12-11 LAB — CBC WITH DIFFERENTIAL (CANCER CENTER ONLY)
Abs Immature Granulocytes: 0.02 10*3/uL (ref 0.00–0.07)
Basophils Absolute: 0 10*3/uL (ref 0.0–0.1)
Basophils Relative: 0 %
Eosinophils Absolute: 0 10*3/uL (ref 0.0–0.5)
Eosinophils Relative: 1 %
HCT: 39.2 % (ref 36.0–46.0)
Hemoglobin: 12.9 g/dL (ref 12.0–15.0)
Immature Granulocytes: 0 %
Lymphocytes Relative: 30 %
Lymphs Abs: 1.6 10*3/uL (ref 0.7–4.0)
MCH: 27.9 pg (ref 26.0–34.0)
MCHC: 32.9 g/dL (ref 30.0–36.0)
MCV: 84.8 fL (ref 80.0–100.0)
Monocytes Absolute: 0.4 10*3/uL (ref 0.1–1.0)
Monocytes Relative: 7 %
Neutro Abs: 3.2 10*3/uL (ref 1.7–7.7)
Neutrophils Relative %: 62 %
Platelet Count: 198 10*3/uL (ref 150–400)
RBC: 4.62 MIL/uL (ref 3.87–5.11)
RDW: 13.2 % (ref 11.5–15.5)
WBC Count: 5.2 10*3/uL (ref 4.0–10.5)
nRBC: 0 % (ref 0.0–0.2)

## 2018-12-11 LAB — FERRITIN: Ferritin: 37 ng/mL (ref 11–307)

## 2018-12-11 LAB — IRON AND TIBC
Iron: 82 ug/dL (ref 41–142)
Saturation Ratios: 22 % (ref 21–57)
TIBC: 376 ug/dL (ref 236–444)
UIBC: 294 ug/dL (ref 120–384)

## 2018-12-11 NOTE — Telephone Encounter (Signed)
Patient returning call.

## 2018-12-11 NOTE — Telephone Encounter (Signed)
Scheduled appt per 7/13 los. °

## 2018-12-11 NOTE — Telephone Encounter (Signed)
Spoke with patient, advised of results as seen below per Dr. Quincy Simmonds. Patient verbalizes understanding and is agreeable.   08 recall placed.   Encounter closed.

## 2018-12-11 NOTE — Patient Instructions (Signed)

## 2018-12-12 ENCOUNTER — Telehealth: Payer: Self-pay | Admitting: *Deleted

## 2018-12-12 NOTE — Telephone Encounter (Signed)
Notified of message below

## 2018-12-12 NOTE — Telephone Encounter (Signed)
-----   Message from Alla Feeling, NP sent at 12/11/2018  3:37 PM EDT ----- Please let her know iron studies are normal. She does not need iron supplement for now.  Thanks, Regan Rakers NP

## 2018-12-14 ENCOUNTER — Ambulatory Visit: Payer: 59 | Admitting: Podiatry

## 2018-12-14 ENCOUNTER — Other Ambulatory Visit: Payer: Self-pay

## 2018-12-14 ENCOUNTER — Ambulatory Visit
Admission: RE | Admit: 2018-12-14 | Discharge: 2018-12-14 | Disposition: A | Discharge status: Home / Self Care | Payer: 59 | Source: Ambulatory Visit | Attending: Family Medicine | Admitting: Family Medicine

## 2018-12-14 DIAGNOSIS — Z1231 Encounter for screening mammogram for malignant neoplasm of breast: Secondary | ICD-10-CM

## 2019-01-01 ENCOUNTER — Ambulatory Visit: Payer: 59

## 2019-01-08 ENCOUNTER — Ambulatory Visit (INDEPENDENT_AMBULATORY_CARE_PROVIDER_SITE_OTHER): Payer: 59

## 2019-01-08 ENCOUNTER — Other Ambulatory Visit: Payer: Self-pay

## 2019-01-08 VITALS — BP 110/70 | HR 88 | Temp 97.3°F | Ht 65.5 in | Wt 208.6 lb

## 2019-01-08 DIAGNOSIS — Z23 Encounter for immunization: Secondary | ICD-10-CM

## 2019-01-08 NOTE — Progress Notes (Signed)
Patient in today for 2nd Gardasil injection.   Contraception: Norlyda LMP: 12/16/18 Last AEX: 10/31/18 with Dr. Quincy Simmonds  Injection given in Left Deltoid. Patient tolerated shot well.   Patient informed next injection due in about 4 months.  Advised patient, if not on birth control, to return for next injection with cycle.   Routed to provider for final review.  Encounter closed.

## 2019-02-12 ENCOUNTER — Telehealth: Payer: Self-pay | Admitting: Genetic Counselor

## 2019-02-12 NOTE — Telephone Encounter (Signed)
Called patient regarding upcoming Webex appointment, left a voicemail. This will be considered a walk-in appointment due to no communication to set this up as virtual.

## 2019-02-13 ENCOUNTER — Inpatient Hospital Stay: Payer: 59

## 2019-02-13 ENCOUNTER — Encounter: Payer: Self-pay | Admitting: Genetic Counselor

## 2019-02-13 ENCOUNTER — Other Ambulatory Visit: Payer: Self-pay

## 2019-02-13 ENCOUNTER — Inpatient Hospital Stay: Payer: 59 | Attending: Nurse Practitioner | Admitting: Genetic Counselor

## 2019-02-13 DIAGNOSIS — Z8041 Family history of malignant neoplasm of ovary: Secondary | ICD-10-CM

## 2019-02-13 DIAGNOSIS — Z8052 Family history of malignant neoplasm of bladder: Secondary | ICD-10-CM | POA: Insufficient documentation

## 2019-02-13 DIAGNOSIS — Z8042 Family history of malignant neoplasm of prostate: Secondary | ICD-10-CM | POA: Insufficient documentation

## 2019-02-13 DIAGNOSIS — Z803 Family history of malignant neoplasm of breast: Secondary | ICD-10-CM | POA: Diagnosis not present

## 2019-02-13 DIAGNOSIS — Z8051 Family history of malignant neoplasm of kidney: Secondary | ICD-10-CM

## 2019-02-13 NOTE — Progress Notes (Signed)
REFERRING PROVIDER: Alla Feeling, NP [place],  [place] [address]  PRIMARY PROVIDER:  Caren Macadam, MD  PRIMARY REASON FOR VISIT:  1. Family history of ovarian cancer   2. Family history of breast cancer   3. Family history of prostate cancer   4. Family history of bladder cancer   5. Family history of kidney cancer      HISTORY OF PRESENT ILLNESS:   Linda Conrad, a 43 y.o. female, was seen for a [place] cancer genetics consultation at the request of Dr. Kalman Shan due to a family history of ovarian, breast, bladder, kidney, and prostate cancer.  Linda Conrad presents to clinic today to discuss the possibility of a hereditary predisposition to cancer, genetic testing, and to further clarify her future cancer risks, as well as potential cancer risks for family members.   Linda Conrad is a 43 y.o. female with no personal history of cancer.    CANCER HISTORY:  Oncology History   No history exists.     RISK FACTORS:  Menarche was at age 54-12.  No live births. OCP use for approximately 24 years.  Ovaries intact: yes.  Hysterectomy: no.  Menopausal status: premenopausal.  HRT use: 0 years. Colonoscopy: no. Mammogram within the last year: yes. Number of breast biopsies: 0.   Past Medical History:  Diagnosis Date  . Abnormal Pap smear of cervix - hpv per report in 2014, seeing gyn 11/16/2013   normal pap with gyn 2015  . Arthritis    hand  . CIN I (cervical intraepithelial neoplasia I) 2020  . Diabetes mellitus without complication ([place])   . Family history of bladder cancer   . Family history of breast cancer   . Family history of kidney cancer   . Family history of ovarian cancer   . Family history of prostate cancer   . Foreign body of hand, right 07/2011   right MP  . GERD (gastroesophageal reflux disease)   . Headache(784.0)    sinus  . HSV-1 (herpes simplex virus 1) infection   . Hypertension    denies HTN, but takes Hyzaar   . Stenosing  tenosynovitis of wrist 07/2011   right    Past Surgical History:  Procedure Laterality Date  . CYST EXCISION    . DORSAL COMPARTMENT RELEASE  08/26/2011   Procedure: RELEASE DORSAL COMPARTMENT (DEQUERVAIN);  Surgeon: Cammie Sickle., MD;  Location: [facility];  Service: Orthopedics;  Laterality: Right;  . FOREIGN BODY REMOVAL  08/26/2011   Procedure: FOREIGN BODY REMOVAL ADULT;  Surgeon: Cammie Sickle., MD;  Location: [facility];  Service: Orthopedics;  Laterality: Right;    Social History   Socioeconomic History  . Marital status: Single    Spouse name: Not on file  . Number of children: 0  . Years of education: Not on file  . Highest education level: Not on file  Occupational History  . Not on file  Social Needs  . Financial resource strain: Not on file  . Food insecurity    Worry: Not on file    Inability: Not on file  . Transportation needs    Medical: Not on file    Non-medical: Not on file  Tobacco Use  . Smoking status: Never Smoker  . Smokeless tobacco: Never Used  Substance and Sexual Activity  . Alcohol use: Yes    Alcohol/week: 0.0 standard drinks    Comment: occasionally  . Drug  use: No  . Sexual activity: Never    Birth control/protection: Pill    Comment: Micronor  Lifestyle  . Physical activity    Days per week: Not on file    Minutes per session: Not on file  . Stress: Not on file  Relationships  . Social Herbalist on phone: Not on file    Gets together: Not on file    Attends religious service: Not on file    Active member of club or organization: Not on file    Attends meetings of clubs or organizations: Not on file    Relationship status: Not on file  Other Topics Concern  . Not on file  Social History Narrative   Work or School: works with senior center      Home Situation: lives alone      Spiritual Beliefs: none, Christian      Lifestyle: active at work but not otherwise; avoiding  carbs              FAMILY HISTORY:  We obtained a detailed, 4-generation family history.  Significant diagnoses are listed below: Family History  Problem Relation Age of Onset  . Hyperlipidemia Mother   . Hypertension Mother   . Hypertension Father   . Stroke Father   . Breast cancer Paternal Grandmother        Age 66s or 87s  . Diabetes Mellitus II Paternal Grandmother   . Dementia Paternal Grandmother   . Stroke Paternal Grandfather   . Hypertension Sister   . Diabetes Sister   . Arthritis Maternal Grandmother   . Heart failure Maternal Grandfather   . Diabetes Maternal Grandfather   . Cancer Paternal Aunt        bladder or kidney cancer, Age 51/58   . Ovarian cancer Other        Great Maternal Aunt, ovarian or bladder cancer  . Ovarian cancer Cousin 23       mother's brother's daughter, ovarian    Linda Conrad has no children. She has one full sister, who is 95, and a paternal half-sister and half-brother, who are in their late 53s. There are no known diagnoses of cancer among any of these individuals.  Linda Conrad mother is 52. She has three maternal uncles who are in their late 84s and 35s. She has one maternal first cousin who died from ovarian cancer at the age of 30. Linda Conrad is unsure if this cousin had genetic testing. Her maternal grandfather died in his 34s, and her maternal grandmother is living at the age of 68. Her maternal grandmother recently had a breast biopsy that is undergoing workup. Linda Conrad has two maternal-great aunts (sisters of her grandmother) who had cancer in their 53s. One had either ovarian or bladder cancer, and the other had an unknown cancer. One of those great-aunts also had a son who was recently diagnosed with prostate cancer in his late 60s.  Linda Conrad father is 52 and she believes that he may have prostate cancer, although this is unconfirmed. She has four paternal aunts and one paternal uncle. One aunt had either bladder or kidney  cancer and died in her 96s. The uncle had "lesions in his brain", that the family suspects may have been due to AIDS. Her paternal grandmother had breast cancer in her 51s or 84s and is currently living at age 56. Her paternal grandfather died in his 51s.   Linda Conrad is unaware of previous  family history of genetic testing for hereditary cancer risks. Her maternal ancestors are of Caucasian and Native American (Cherokee) descent, and paternal ancestors are of [place] American and Caucasian descent. There is no reported Ashkenazi Jewish ancestry. There is no known consanguinity.  GENETIC COUNSELING ASSESSMENT: Linda Conrad is a 43 y.o. female with a family history of ovarian, breast, prostate, kidney, and bladder cancer, which is somewhat suggestive of a hereditary cancer syndrome. We, therefore, discussed and recommended the following at today's visit.   DISCUSSION: We discussed that 15-20% of ovarian cancer is hereditary, with most cases associated with BRCA1/2.  There are other genes that can be associated with hereditary ovarian cancer syndromes.  These include BRIP1, RAD51C, RAD51D, EPCAM, MLH1, MSH2, MSH6, and PMS2.  We discussed that testing is beneficial for several reasons including knowing about other cancer risks, identifying potential screening and risk-reduction options that may be appropriate, and to understand if other family members could be at risk for cancer and allow them to undergo genetic testing.   We reviewed the characteristics, features and inheritance patterns of hereditary cancer syndromes. We also discussed genetic testing, including the appropriate family members to test, the process of testing, insurance coverage and turn-around-time for results. We discussed the implications of a negative, positive and/or variant of uncertain significant result. We recommended Linda Conrad pursue genetic testing for the [facility] CancerNext+RNAinsight gene panel.   The CancerNext gene panel  offered by [facility] includes sequencing and rearrangement analysis for the following 36 genes:   APC*, ATM*, AXIN2, BARD1, BMPR1A, BRCA1*, BRCA2*, BRIP1*, CDH1*, CDK4, CDKN2A, CHEK2*, DICER1, HOXB13, EPCAM, GREM1, MLH1*, MSH2*, MSH3, MSH6*, MUTYH*, NBN, NF1*, NTHL1, PALB2*, PMS2*, POLD1, POLE, PTEN*, RAD51C*, RAD51D*, RECQL, SMAD4, SMARCA4, STK11, and TP53*. DNA and RNA analyses performed for * genes.    Based on Linda Conrad family history of cancer, she meets medical criteria for genetic testing. Despite that she meets criteria, she may still have an out of pocket cost. We discussed that if her out of pocket cost for testing is over $100, the laboratory will call and confirm whether she wants to proceed with testing.  If the out of pocket cost of testing is less than $100 she will be billed by the genetic testing laboratory.   PLAN: After considering the risks, benefits, and limitations, Linda Conrad provided informed consent to pursue genetic testing and the blood sample was sent to [facility] for analysis of the CancerNext+RNAinsight panel. Results should be available within approximately two-three weeks' time, at which point they will be disclosed by telephone to Linda Conrad, as will any additional recommendations warranted by these results. Linda Conrad will receive a summary of her genetic counseling visit and a copy of her results once available. This information will also be available in Epic.   Lastly, we encouraged Linda Conrad to remain in contact with cancer genetics annually so that we can continuously update the family history and inform her of any changes in cancer genetics and testing that may be of benefit for this family.  Linda Conrad will attempt to learn more about her family history of cancer and will update me when she learns new information.  Linda Conrad questions were answered to her satisfaction today. Our contact information was provided should additional questions or  concerns arise. Thank you for the referral and allowing [place] to share in the care of your patient.   Clint Guy, MS, [facility] Certified Genetic Counselor [email or web address] Phone: [phone]  The patient was seen for a  total of 60 minutes in face-to-face genetic counseling.  This patient was discussed with Drs. Magrinat, Lindi Adie and/or Burr Medico who agrees with the above.    _______________________________________________________________________ For Office Staff:  Number of people involved in session: 1 Was an Intern/ student involved with case: no

## 2019-02-21 ENCOUNTER — Telehealth: Payer: Self-pay | Admitting: Genetic Counselor

## 2019-02-21 ENCOUNTER — Encounter: Payer: Self-pay | Admitting: Genetic Counselor

## 2019-02-21 NOTE — Telephone Encounter (Signed)
Ms. Jarrard provided the following additional family history information:  Maternal grandmother has a "spot" on her breast that they are watching but has not been diagnosed as cancer, she has a follow-up appointment in a month  Maternal great-aunt (Clem) had ovarian cancer, her son has lung cancer (not prostate cancer)  Maternal great-aunt Ronald Pippins, Clem's identical twin sister) had unknown cancer, her daughter had lung cancer  She will call back if there is any additional information that she learns about the family history.

## 2019-02-27 DIAGNOSIS — Z1379 Encounter for other screening for genetic and chromosomal anomalies: Secondary | ICD-10-CM | POA: Insufficient documentation

## 2019-02-28 ENCOUNTER — Other Ambulatory Visit (INDEPENDENT_AMBULATORY_CARE_PROVIDER_SITE_OTHER): Payer: 59

## 2019-02-28 ENCOUNTER — Telehealth: Payer: Self-pay | Admitting: Genetic Counselor

## 2019-02-28 ENCOUNTER — Other Ambulatory Visit: Payer: Self-pay

## 2019-02-28 DIAGNOSIS — E1159 Type 2 diabetes mellitus with other circulatory complications: Secondary | ICD-10-CM | POA: Diagnosis not present

## 2019-02-28 DIAGNOSIS — E1169 Type 2 diabetes mellitus with other specified complication: Secondary | ICD-10-CM | POA: Diagnosis not present

## 2019-02-28 DIAGNOSIS — N920 Excessive and frequent menstruation with regular cycle: Secondary | ICD-10-CM

## 2019-02-28 DIAGNOSIS — Z23 Encounter for immunization: Secondary | ICD-10-CM

## 2019-02-28 DIAGNOSIS — I10 Essential (primary) hypertension: Secondary | ICD-10-CM

## 2019-02-28 DIAGNOSIS — E119 Type 2 diabetes mellitus without complications: Secondary | ICD-10-CM

## 2019-02-28 DIAGNOSIS — I152 Hypertension secondary to endocrine disorders: Secondary | ICD-10-CM

## 2019-02-28 DIAGNOSIS — E785 Hyperlipidemia, unspecified: Secondary | ICD-10-CM

## 2019-02-28 LAB — CBC WITH DIFFERENTIAL/PLATELET
Basophils Absolute: 0 10*3/uL (ref 0.0–0.1)
Basophils Relative: 0.6 % (ref 0.0–3.0)
Eosinophils Absolute: 0.1 10*3/uL (ref 0.0–0.7)
Eosinophils Relative: 1.3 % (ref 0.0–5.0)
HCT: 40.2 % (ref 36.0–46.0)
Hemoglobin: 13.2 g/dL (ref 12.0–15.0)
Lymphocytes Relative: 30.5 % (ref 12.0–46.0)
Lymphs Abs: 1.6 10*3/uL (ref 0.7–4.0)
MCHC: 32.9 g/dL (ref 30.0–36.0)
MCV: 86.5 fl (ref 78.0–100.0)
Monocytes Absolute: 0.4 10*3/uL (ref 0.1–1.0)
Monocytes Relative: 7.4 % (ref 3.0–12.0)
Neutro Abs: 3.1 10*3/uL (ref 1.4–7.7)
Neutrophils Relative %: 60.2 % (ref 43.0–77.0)
Platelets: 289 10*3/uL (ref 150.0–400.0)
RBC: 4.65 Mil/uL (ref 3.87–5.11)
RDW: 14 % (ref 11.5–15.5)
WBC: 5.2 10*3/uL (ref 4.0–10.5)

## 2019-02-28 LAB — COMPREHENSIVE METABOLIC PANEL
ALT: 19 U/L (ref 0–35)
AST: 20 U/L (ref 0–37)
Albumin: 4.2 g/dL (ref 3.5–5.2)
Alkaline Phosphatase: 99 U/L (ref 39–117)
BUN: 13 mg/dL (ref 6–23)
CO2: 27 mEq/L (ref 19–32)
Calcium: 9.7 mg/dL (ref 8.4–10.5)
Chloride: 96 mEq/L (ref 96–112)
Creatinine, Ser: 0.82 mg/dL (ref 0.40–1.20)
GFR: 91.4 mL/min (ref >60.00)
Glucose, Bld: 310 mg/dL — ABNORMAL HIGH (ref 70–99)
Potassium: 3.8 mEq/L (ref 3.5–5.1)
Sodium: 134 mEq/L — ABNORMAL LOW (ref 135–145)
Total Bilirubin: 0.7 mg/dL (ref 0.2–1.2)
Total Protein: 6.8 g/dL (ref 6.0–8.3)

## 2019-02-28 LAB — LIPID PANEL
Cholesterol: 169 mg/dL (ref 0–200)
HDL: 55.8 mg/dL (ref >39.00)
LDL Cholesterol: 96 mg/dL (ref 0–99)
NonHDL: 113.22
Total CHOL/HDL Ratio: 3
Triglycerides: 84 mg/dL (ref 0.0–149.0)
VLDL: 16.8 mg/dL (ref 0.0–40.0)

## 2019-02-28 LAB — IBC + FERRITIN
Ferritin: 29.4 ng/mL (ref 10.0–291.0)
Iron: 71 ug/dL (ref 42–145)
Saturation Ratios: 18.9 % — ABNORMAL LOW (ref 20.0–50.0)
Transferrin: 269 mg/dL (ref 212.0–360.0)

## 2019-02-28 LAB — MICROALBUMIN / CREATININE URINE RATIO
Creatinine,U: 81.1 mg/dL
Microalb Creat Ratio: 0.9 mg/g (ref 0.0–30.0)
Microalb, Ur: <0.7 mg/dL (ref 0.0–1.9)

## 2019-02-28 LAB — HEMOGLOBIN A1C: Hgb A1c MFr Bld: 12.5 % — ABNORMAL HIGH (ref 4.6–6.5)

## 2019-02-28 LAB — TSH: TSH: 0.96 u[IU]/mL (ref 0.35–4.50)

## 2019-02-28 NOTE — Telephone Encounter (Signed)
LVM requesting a call back to go over Ms. Quiroga genetic testing results.

## 2019-03-01 NOTE — Telephone Encounter (Signed)
Revealed negative genetic testing.  Discussed that we do not know why there is cancer in the family. It could be due to a different gene that we are not testing, or maybe our current technology may not be able to pick something up.  It will be important for her to keep in contact with genetics to keep up with whether additional testing may be needed.  Two variants of uncertain significance were detected - one in the MSH3 gene called p.I461V, and one in the RAD51D gene called p.C117S.  The result is still considered normal and these variants should not change medical management.

## 2019-03-02 ENCOUNTER — Ambulatory Visit: Payer: Self-pay | Admitting: Genetic Counselor

## 2019-03-02 ENCOUNTER — Encounter: Payer: Self-pay | Admitting: Genetic Counselor

## 2019-03-02 DIAGNOSIS — Z1379 Encounter for other screening for genetic and chromosomal anomalies: Secondary | ICD-10-CM

## 2019-03-02 NOTE — Progress Notes (Signed)
HPI:  Ms. Hunton was previously seen in the [facility] clinic due to a family history of breast and ovarian cancer and concerns regarding a hereditary predisposition to cancer. Please refer to our prior cancer genetics clinic note for more information regarding our discussion, assessment and recommendations, at the time. Ms. Allerton's recent genetic test results were disclosed to her, as were recommendations warranted by these results. These results and recommendations are discussed in more detail below.  CANCER HISTORY:  Oncology History   No history exists.    FAMILY HISTORY:  We obtained a detailed, 4-generation family history.  Significant diagnoses are listed below: Family History  Problem Relation Age of Onset  . Hyperlipidemia Mother   . Hypertension Mother   . Hypertension Father   . Stroke Father   . Breast cancer Paternal Grandmother        Age 40s or 50s  . Diabetes Mellitus II Paternal Grandmother   . Dementia Paternal Grandmother   . Stroke Paternal Grandfather   . Hypertension Sister   . Diabetes Sister   . Arthritis Maternal Grandmother   . Heart failure Maternal Grandfather   . Diabetes Maternal Grandfather   . Cancer Paternal Aunt        bladder or kidney cancer, Age 57/58   . Ovarian cancer Other        maternal great-aunt, in her 70s  . Ovarian cancer Cousin 27       mother's brother's daughter, ovarian   . Cancer Other        matrenal great-aunt, unknown type    Ms. Vencill has no children. She has one full sister, who is 40, and a paternal half-sister and half-brother, who are in their late 20s. There are no known diagnoses of cancer among any of these individuals.  Ms. Lorenzi's mother is 64. She has three maternal uncles who are in their late 50s and 60s. She has one maternal first cousin who died from ovarian cancer at the age of 27. Ms. Neuberger is unsure if this cousin had genetic testing. Her maternal grandfather died in his 80s, and  her maternal grandmother is living at the age of 88. Her maternal grandmother recently had a breast biopsy that is undergoing workup. Ms. Saar has two maternal-great aunts (sisters of her grandmother) who had cancer in their 70s. One had ovarian cancer, and the other had colon cancer. The aunt with colon cancer has a son who was recently diagnosed with lung cancer in his 60s, and the aunt with ovarian cancer has a daughter who died from lung cancer in her late 60s.   Ms. Bartelt's father is 70 and she believes that he may have prostate cancer, although this is unconfirmed. She has four paternal aunts and one paternal uncle. One aunt had either bladder or kidney cancer and died in her 60s. The uncle had "lesions in his brain", that the family suspects may have been due to AIDS. Her paternal grandmother had breast cancer in her 40s or 50s and is currently living at age 93. Her paternal grandfather died in his 70s.   Ms. Freitas is unaware of previous family history of genetic testing for hereditary cancer risks. Her maternal ancestors are of Caucasian and Native American ([place]) descent, and paternal ancestors are of African American and Caucasian descent. There is no reported Ashkenazi Jewish ancestry. There is no known consanguinity.  GENETIC TEST RESULTS: Genetic testing reported out on 02/27/2019 through the [facility] CancerNext+RNAinsight cancer panel   found no pathogenic variants. The CancerNext gene panel offered by [facility] includes sequencing and rearrangement analysis for the following 36 genes:   APC*, ATM*, AXIN2, BARD1, BMPR1A, BRCA1*, BRCA2*, BRIP1*, CDH1*, CDK4, CDKN2A, CHEK2*, DICER1, HOXB13, EPCAM, GREM1, MLH1*, MSH2*, MSH3, MSH6*, MUTYH*, NBN, NF1*, NTHL1, PALB2*, PMS2*, POLD1, POLE, PTEN*, RAD51C*, RAD51D*, RECQL, SMAD4, SMARCA4, STK11, and TP53*. DNA and RNA analyses performed for * genes. The test report will be scanned into EPIC and located under the Molecular Pathology section of  the Results Review tab.  A portion of the result report is included below for reference.     We discussed with Ms. Zody that because current genetic testing is not perfect, it is possible there may be a gene mutation in one of these genes that current testing cannot detect, but that chance is small.  We also discussed, that there could be another gene that has not yet been discovered, or that we have not yet tested, that is responsible for the cancer diagnoses in the family. It is also possible there is a hereditary cause for the cancer in the family that Ms. Boatman did not inherit and therefore was not identified in her testing.  Therefore, it is important to remain in touch with cancer genetics in the future so that we can continue to offer Ms. Hauswirth the most up to date genetic testing.   Genetic testing did identify two Variants of uncertain significance (VUS) - one in the MSH3 gene called p.I461V, and a second in the RAD51D gene called p.C117S.  At this time, it is unknown if these variants are associated with increased cancer risk or if they are normal findings, but most variants such as these get reclassified to being inconsequential. They should not be used to make medical management decisions. With time, we suspect the lab will determine the significance of these variants, if any. If we do learn more about them, we will try to contact Ms. Deangelo to discuss it further. However, it is important to stay in touch with [place] periodically and keep the address and phone number up to date.  CANCER SCREENING RECOMMENDATIONS: Ms. Fentress test result is considered negative (normal).  This means that we have not identified a hereditary cause for her family history of cancer at this time. Most cancers happen by chance and this negative test suggests that her cancer may fall into this category.    While reassuring, this does not definitively rule out a hereditary predisposition to cancer. It is still  possible that there could be genetic mutations that are undetectable by current technology. There could be genetic mutations in genes that have not been tested or identified to increase cancer risk.  Therefore, it is recommended she continue to follow the cancer management and screening guidelines provided by herprimary healthcare provider.   An individual's cancer risk and medical management are not determined by genetic test results alone. Overall cancer risk assessment incorporates additional factors, including personal medical history, family history, and any available genetic information that may result in a personalized plan for cancer prevention and surveillance  Based on Ms. Nemmers family of cancer, as well as her genetic test results, statistical models (Tyrer-Cuzick) was used to estimate her risk of developing breast cancer. These estimate her lifetime risk of developing breast cancer to be approximately 17.4%.  The patient's lifetime breast cancer risk is a preliminary estimate based on available information using one of several models endorsed by the [place] (ACS). The ACS  recommends consideration of breast MRI screening as an adjunct to mammography for patients at high risk (defined as 20% or greater lifetime risk). A more detailed breast cancer risk assessment can be considered, if clinically indicated.     Risk Factors used to calculate Tyrer-Cuzick:  Age: 43  Weight: 208 lbs  Height: 5'5"  Menarche: 10  No prior births  Premenopausal  HRT use: never  BRCA gene: normal  Ovarian cancer: no  No prior biopsy  AJ ancestry: no  Family history: Mother - age 32, no prior cancer, BRCA unknown Sister - age 15, no prior cancer, BRCA unknown Paternal half-sister - age 22, no prior cancer, BRCA unknown Paternal half-sister - age 37, no prior cancer, BRCA unknown Maternal grandmother - age 44, no prior cancer, BRCA unknown Maternal uncle's daughter - ovarian  cancer age 22, BRCA unknown Paternal grandmother - age 15, breast cancer age 12, BRCA unknown Paternal aunt - age 59, no cancer, BRCA unknown Paternal aunt - age 43, no cancer, BRCA unknown Paternal aunt - age 32, no cancer, BRCA unknown Paternal aunt - age 57, no cancer, BRCA unknown  RECOMMENDATIONS FOR FAMILY MEMBERS:  Individuals in this family might be at some increased risk of developing cancer, over the general population risk, simply due to the family history of cancer.  We recommended women in this family have a yearly mammogram beginning at age 63, or 47 years younger than the earliest onset of cancer, an annual clinical breast exam, and perform monthly breast self-exams. Women in this family should also have a gynecological exam as recommended by their primary provider. All family members should have a colonoscopy by age 7.  It is also possible there is a hereditary cause for the cancer in Ms. Pelle family that she did not inherit and therefore was not identified in her.  Based on Ms. Gassner family history, we recommended relatives of her maternal cousin, who was diagnosed with ovarian cancer at age 79, as well as relatives of her mateernal great-aunt, who was diagnosed with ovarian cancer in her 1s, have genetic counseling and testing. Ms. Knee will let us know if we can be of any assistance in coordinating genetic counseling and/or testing for this family member.   FOLLOW-UP: Lastly, we discussed with Ms. Gowens that cancer genetics is a rapidly advancing field and it is possible that new genetic tests will be appropriate for her and/or her family members in the future. We encouraged her to remain in contact with cancer genetics on an annual basis so we can update her personal and family histories and let her know of advances in cancer genetics that may benefit this family.   Our contact number was provided. Ms. Macias questions were answered to her satisfaction, and she  knows she is welcome to call us at anytime with additional questions or concerns.   Clint Guy, MS, [facility] Certified Genetic Counselor [email or web address] Phone: [phone]

## 2019-03-09 ENCOUNTER — Telehealth (INDEPENDENT_AMBULATORY_CARE_PROVIDER_SITE_OTHER): Payer: 59 | Admitting: Family Medicine

## 2019-03-09 ENCOUNTER — Encounter: Payer: Self-pay | Admitting: Family Medicine

## 2019-03-09 ENCOUNTER — Other Ambulatory Visit: Payer: Self-pay

## 2019-03-09 DIAGNOSIS — E119 Type 2 diabetes mellitus without complications: Secondary | ICD-10-CM

## 2019-03-09 MED ORDER — BLOOD GLUCOSE MONITOR KIT: PACK | 0 refills | Status: DC

## 2019-03-09 NOTE — Progress Notes (Signed)
Virtual Visit via Video Note  I connected with Linda Conrad  on 03/09/19 at 10:30 AM EDT by a video enabled telemedicine application and verified that I am speaking with the correct person using two identifiers.  Location patient: home Location provider:work or home office Persons participating in the virtual visit: patient, provider  I discussed the limitations of evaluation and management by telemedicine and the availability of in person appointments. The patient expressed understanding and agreed to proceed.   Linda Conrad DOB: Oct 29, 1975 Encounter date: 03/09/2019  This is a 43 y.o. female who presents with Chief Complaint  Patient presents with  . Follow-up    discuss labs    History of present illness: A1C elevation on recent bloodwork to 12.5 from 6.8.   Thinks this is related to her "juicing". She was doing 3 smoothies/week from juice shop and feels like this was cause since she is otherwise eating cleanly. Had cramping with the metformin 1042m BID and went back to 5064mBID. She has recently increased to 10006mID and so far doing well with this.   Sugar free tea, zero coke, lots of water.   Eating chicken, salads. Really trying to eat cleanly.   Allergies  Allergen Reactions  . Oak Bark [Quercus RobIshpemingees  . Soap Rash    Everything except Dove   Current Meds  Medication Sig  . acetaminophen (TYLENOL) 650 MG CR tablet Take 650 mg by mouth every 8 (eight) hours as needed for pain.  . Ascorbic Acid (VITAMIN C PO) Take by mouth daily.  . fluticasone (FLONASE) 50 MCG/ACT nasal spray Place into both nostrils daily.  . hydrochlorothiazide (HYDRODIURIL) 25 MG tablet TAKE 1 TABLET BY MOUTH DAILY  . losartan (COZAAR) 50 MG tablet TAKE 1 TABLET BY MOUTH DAILY  . metFORMIN (GLUCOPHAGE) 500 MG tablet Take 1 tablet (500 mg total) by mouth 2 (two) times daily with a meal.  . mometasone (ELOCON) 0.1 % cream Apply 1 application topically daily.  .  Multiple Vitamins-Minerals (ZINC PO) Take by mouth.  . NORLYDA 0.35 MG tablet Take 1 tablet (0.35 mg total) by mouth daily.  . oMarland Kitcheneprazole (PRILOSEC) 20 MG capsule TAKE ONE CAPSULE BY MOUTH DAILY  . SALINE NASAL SPRAY NA Place into the nose as needed.  . triamcinolone cream (KENALOG) 0.1 % Apply topically daily.  . valACYclovir (VALTREX) 500 MG tablet Take 1 tablet (500 mg total) by mouth daily.    Review of Systems  Constitutional: Positive for appetite change (more sedentary).  HENT: Negative for congestion.   Respiratory: Negative for cough and shortness of breath.   Cardiovascular: Negative for chest pain.  Genitourinary: Positive for frequency.    Objective:  There were no vitals taken for this visit.      BP Readings from Last 3 Encounters:  01/08/19 110/70  12/11/18 121/75  11/28/18 116/62   Wt Readings from Last 3 Encounters:  01/08/19 208 lb 9.6 oz (94.6 kg)  12/11/18 212 lb 12.8 oz (96.5 kg)  11/28/18 214 lb (97.1 kg)    EXAM:  GENERAL: alert, oriented, appears well and in no acute distress  HEENT: atraumatic, conjunctiva clear, no obvious abnormalities on inspection of external nose and ears  NECK: normal movements of the head and neck  LUNGS: on inspection no signs of respiratory distress, breathing rate appears normal, no obvious gross SOB, gasping or wheezing  CV: no obvious cyanosis  MS: moves all visible extremities without noticeable abnormality  PSYCH/NEURO: pleasant and cooperative, no obvious depression or anxiety, speech and thought processing grossly intact   Assessment/Plan  1. Type 2 diabetes mellitus without complication, without long-term current use of insulin (HCC) She is going to check home sugars and report back in 2 weeks time. She is reluctant to start additional medications. She has increased metformin from 560m BID to 10042mBID and cut out smoothies. Already eating healthy; going to work on adding exercise.   Discussed additional  med add on consideration of rybelsus or jardiance if needed. We can determine after she messages with sugar update.   - blood glucose meter kit and supplies KIT; Dispense based on patient and insurance preference. Use up to three times daily as directed. (FOR ICD-9 250.00, 250.01).  Dispense: 1 each; Refill: 0  reviewed medications, benefits, monitoring sugars, management of diabetes. Over half of 30 minute visit spent in discussion about medications, sugar control.  I discussed the assessment and treatment plan with the patient. The patient was provided an opportunity to ask questions and all were answered. The patient agreed with the plan and demonstrated an understanding of the instructions.   The patient was advised to call back or seek an in-person evaluation if the symptoms worsen or if the condition fails to improve as anticipated.  I provided 30 minutes of non-face-to-face time during this encounter. Return in about 3 months (around 06/09/2019) for Chronic condition visit.   JuMicheline RoughMD

## 2019-03-11 ENCOUNTER — Other Ambulatory Visit: Payer: Self-pay | Admitting: Family Medicine

## 2019-03-12 ENCOUNTER — Telehealth: Payer: Self-pay | Admitting: *Deleted

## 2019-03-12 NOTE — Telephone Encounter (Signed)
Printed Rx was faxed to [facility] on 10/9 and the pt was informed.  Appt scheduled for 06/11/2019.

## 2019-03-12 NOTE — Telephone Encounter (Signed)
-----   Message from Caren Macadam, MD sent at 03/09/2019 11:09 AM EDT ----- I ordered glucometer; but it printed. Can you send rx to her [place] please? Also schedule in office [facility] in 3 mo time. Thanks!

## 2019-05-08 NOTE — Progress Notes (Signed)
Patient in today for 3rd and final Gardasil injection.   Contraception: Norlyda LMP: 04/28/19 Last AEX: 10/31/18 with Dr Quincy Simmonds.  Injection given in left deltoid. Patient tolerated shot well.    Routed to provider for final review.  Encounter closed.

## 2019-05-10 ENCOUNTER — Other Ambulatory Visit: Payer: Self-pay

## 2019-05-10 ENCOUNTER — Ambulatory Visit (INDEPENDENT_AMBULATORY_CARE_PROVIDER_SITE_OTHER): Payer: 59 | Admitting: Obstetrics and Gynecology

## 2019-05-10 VITALS — BP 110/60 | HR 82 | Temp 97.2°F | Ht 66.5 in | Wt 203.0 lb

## 2019-05-10 DIAGNOSIS — Z23 Encounter for immunization: Secondary | ICD-10-CM | POA: Diagnosis not present

## 2019-05-14 ENCOUNTER — Other Ambulatory Visit: Payer: Self-pay | Admitting: Family Medicine

## 2019-05-14 DIAGNOSIS — E119 Type 2 diabetes mellitus without complications: Secondary | ICD-10-CM

## 2019-06-01 DIAGNOSIS — R87810 Cervical high risk human papillomavirus (HPV) DNA test positive: Secondary | ICD-10-CM

## 2019-06-01 HISTORY — DX: Cervical high risk human papillomavirus (HPV) DNA test positive: R87.810

## 2019-06-11 ENCOUNTER — Ambulatory Visit: Payer: 59 | Admitting: Family Medicine

## 2019-06-11 ENCOUNTER — Other Ambulatory Visit: Payer: Self-pay

## 2019-06-11 NOTE — Progress Notes (Deleted)
  Kelton Pillar DOB: April 23, 1976 Encounter date: 06/11/2019  This is a 45 y.o. female who presents with No chief complaint on file.   History of present illness: I last visit was in October after doing some blood work and having a significant increase in A1c.  She suspected this was related to her doing "juicing".  We increase Metformin to 1000 mg twice daily and Decided to cut out some of these.  She was instructed to check sugars at home and see how those are running.  HPI   Allergies  Allergen Reactions  . Oak Bark [Quercus [place] trees  . Soap Rash    Everything except Dove   No outpatient medications have been marked as taking for the 06/11/19 encounter (Appointment) with Caren Macadam, MD.    Review of Systems  Objective:  There were no vitals taken for this visit.      BP Readings from Last 3 Encounters:  05/10/19 110/60  01/08/19 110/70  12/11/18 121/75   Wt Readings from Last 3 Encounters:  05/10/19 203 lb (92.1 kg)  01/08/19 208 lb 9.6 oz (94.6 kg)  12/11/18 212 lb 12.8 oz (96.5 kg)    Physical Exam  Assessment/Plan  There are no diagnoses linked to this encounter.       Micheline Rough, MD

## 2019-07-05 LAB — HM DIABETES EYE EXAM

## 2019-07-09 ENCOUNTER — Other Ambulatory Visit: Payer: Self-pay

## 2019-07-09 ENCOUNTER — Encounter: Payer: Self-pay | Admitting: Family Medicine

## 2019-07-09 ENCOUNTER — Ambulatory Visit: Payer: 59 | Admitting: Family Medicine

## 2019-07-09 ENCOUNTER — Ambulatory Visit (INDEPENDENT_AMBULATORY_CARE_PROVIDER_SITE_OTHER): Payer: 59

## 2019-07-09 VITALS — BP 108/78 | HR 89 | Temp 97.2°F | Ht 66.5 in | Wt 200.6 lb

## 2019-07-09 DIAGNOSIS — E1159 Type 2 diabetes mellitus with other circulatory complications: Secondary | ICD-10-CM

## 2019-07-09 DIAGNOSIS — E1165 Type 2 diabetes mellitus with hyperglycemia: Secondary | ICD-10-CM

## 2019-07-09 DIAGNOSIS — R0789 Other chest pain: Secondary | ICD-10-CM

## 2019-07-09 DIAGNOSIS — E119 Type 2 diabetes mellitus without complications: Secondary | ICD-10-CM

## 2019-07-09 DIAGNOSIS — I10 Essential (primary) hypertension: Secondary | ICD-10-CM

## 2019-07-09 DIAGNOSIS — I152 Hypertension secondary to endocrine disorders: Secondary | ICD-10-CM

## 2019-07-09 LAB — POCT GLYCOSYLATED HEMOGLOBIN (HGB A1C): Hemoglobin A1C: 12.1 % — AB (ref 4.0–5.6)

## 2019-07-09 MED ORDER — BLOOD GLUCOSE MONITOR KIT: PACK | 0 refills | Status: AC

## 2019-07-09 MED ORDER — JARDIANCE 10 MG PO TABS: 10.0000 mg | ORAL_TABLET | Freq: Every day | ORAL | 1 refills | Status: DC

## 2019-07-09 NOTE — Patient Instructions (Signed)
Keep metformin at 500mg  twice daily. Adding jardiance. Check sugars intermittently. Goal is range of 100-140. Check sugars fasting or 2 hour post meal.

## 2019-07-09 NOTE — Progress Notes (Signed)
Linda Conrad DOB: [birth year] Encounter date: 07/09/2019  This is a 44 y.o. female who presents with Chief Complaint  Patient presents with  . Follow-up    History of present illness: Last visit was in October/2020.  We discussed increased A1c at that time up to 12.5 from 6.8.  She thought this was related to "juicing"  Increase Metformin from 500 mg twice daily to 1000 mg twice daily and cut out some of these.  She was going to work on adding exercise.  She was given a glucometer to check sugars and was going to report back numbers to me.  Couldn't increase metformin because second pill upsets her stomach. Gets cramping in mid abdomen. No loose stools. Sometimes looser stools on period, but nothing regular.   Has added in walking when weather is nice. Has been walking with husband.  Has been working on water intake. Has switched soda to coke zero and sprite zero.     Allergies  Allergen Reactions  . Oak Bark [Quercus [place] trees  . Soap Rash    Everything except Dove   Current Meds  Medication Sig  . acetaminophen (TYLENOL) 650 MG CR tablet Take 650 mg by mouth every 8 (eight) hours as needed for pain.  . blood glucose meter kit and supplies KIT Dispense based on patient and insurance preference. Use up to three times daily as directed. (FOR ICD-9 250.00, 250.01).  . CINNAMON PO Take by mouth.  . ELDERBERRY PO Take by mouth.  . fluticasone (FLONASE) 50 MCG/ACT nasal spray Place into both nostrils daily.  . hydrochlorothiazide (HYDRODIURIL) 25 MG tablet TAKE ONE TABLET BY MOUTH DAILY  . IBUPROFEN PO Take by mouth as needed. Knee pain  . losartan (COZAAR) 50 MG tablet TAKE ONE TABLET BY MOUTH DAILY  . metFORMIN (GLUCOPHAGE) 500 MG tablet TAKE ONE TABLET BY MOUTH TWICE A DAY WITH A MEAL (Patient taking differently: daily with breakfast. )  . mometasone (ELOCON) 0.1 % cream Apply 1 application topically daily.  . Multiple Vitamins-Minerals (ZINC PO) Take by mouth.   . Omega-3 Fatty Acids (FISH OIL) 1000 MG CAPS Take by mouth.  Marland Kitchen omeprazole (PRILOSEC) 20 MG capsule TAKE ONE CAPSULE BY MOUTH DAILY  . OVER THE COUNTER MEDICATION Calcium 627m with vitamin D3  . SALINE NASAL SPRAY NA Place into the nose as needed.  . triamcinolone cream (KENALOG) 0.1 % Apply topically daily.  . TURMERIC CURCUMIN PO Take by mouth.  . valACYclovir (VALTREX) 500 MG tablet Take 1 tablet (500 mg total) by mouth daily.  . [DISCONTINUED] Ascorbic Acid (VITAMIN C PO) Take by mouth daily.  . [DISCONTINUED] blood glucose meter kit and supplies KIT Dispense based on patient and insurance preference. Use up to three times daily as directed. (FOR ICD-9 250.00, 250.01).    Review of Systems  Constitutional: Negative for chills, fatigue and fever.  Respiratory: Negative for cough, chest tightness, shortness of breath and wheezing.   Cardiovascular: Negative for chest pain, palpitations and leg swelling.    Objective:  BP 108/78 (BP Location: Left Arm, Patient Position: Sitting, Cuff Size: Large)   Pulse 89   Temp (!) 97.2 F (36.2 C) (Temporal)   Ht 5' 6.5" (1.689 m)   Wt 200 lb 9.6 oz (91 kg)   SpO2 98%   BMI 31.89 kg/m   Weight: 200 lb 9.6 oz (91 kg)   BP Readings from Last 3 Encounters:  07/09/19 108/78  05/10/19 110/60  01/08/19 110/70   Wt Readings from Last 3 Encounters:  07/09/19 200 lb 9.6 oz (91 kg)  05/10/19 203 lb (92.1 kg)  01/08/19 208 lb 9.6 oz (94.6 kg)    Physical Exam Constitutional:      General: She is not in acute distress.    Appearance: She is well-developed.  HENT:     Head: Normocephalic and atraumatic.  Cardiovascular:     Rate and Rhythm: Normal rate and regular rhythm.     Heart sounds: Normal heart sounds. No murmur.  Pulmonary:     Effort: Pulmonary effort is normal.     Breath sounds: Normal breath sounds.  Abdominal:     General: Bowel sounds are normal. There is no distension.     Palpations: Abdomen is soft.     Tenderness:  There is no abdominal tenderness. There is no guarding.  Skin:    General: Skin is warm and dry.     Comments: Sensory exam of the foot is normal, tested with the monofilament. Good pulses, no lesions or ulcers, good peripheral pulses.  Psychiatric:        Judgment: Judgment normal.     Assessment/Plan  1. Uncontrolled type 2 diabetes mellitus with hyperglycemia (HCC) Keep metformin at 562m twice daily. Adding jardiance. Check sugars intermittently. Goal is range of 100-140. Check sugars fasting or 2 hour post meal.  - POC HgB A1c  2. Hypertension associated with diabetes ([place] controlled.   3. Chest discomfort - DG Chest 2 View; Future    Return in about 1 month (around 08/06/2019) for virtual 30 min.     JMicheline Rough MD

## 2019-07-26 ENCOUNTER — Other Ambulatory Visit: Payer: Self-pay | Admitting: Family Medicine

## 2019-07-30 ENCOUNTER — Telehealth: Payer: Self-pay | Admitting: Obstetrics and Gynecology

## 2019-07-30 NOTE — Telephone Encounter (Signed)
Spoke with patient, she is unable to talk right now, she will return call to office later today.

## 2019-07-30 NOTE — Telephone Encounter (Signed)
Patient has been bleeding abnormally for 2 weeks.

## 2019-08-01 ENCOUNTER — Other Ambulatory Visit: Payer: Self-pay

## 2019-08-01 ENCOUNTER — Encounter: Payer: 59 | Attending: Family Medicine | Admitting: Dietician

## 2019-08-01 ENCOUNTER — Encounter: Payer: Self-pay | Admitting: Dietician

## 2019-08-01 DIAGNOSIS — E119 Type 2 diabetes mellitus without complications: Secondary | ICD-10-CM | POA: Diagnosis not present

## 2019-08-01 NOTE — Progress Notes (Signed)
Diabetes Self-Management Education  Visit Type: First/Initial  Appt. Start Time: 900a Appt. End Time: 1000a  08/01/2019  [name], identified by name and date of birth, is a 44 y.o. female with a diagnosis of Diabetes: Type 2.   ASSESSMENT  Height 5\' 6"  (1.676 m), weight 200 lb 12.8 oz (91.1 kg). Body mass index is 32.41 kg/m.   Pt reports she has not been eating as healthy since the COVID-19 pandemic began. She reports she consumed fruit smoothies ( orange or peach 3xs/week) from [employer] and tea daily during the summer of 2020. Pt reports she thinks this contributed to her A1c of 12.1. She notes she drank regular coca-cola frequently during this time as well. She reports she has improved her beverage choices by drinking more water and coke zero. She notes she drinks 4oz of regular coke occasionally. Pt reports she drinks water at work and has started eating peach-flavored Activia yogurt with sherbet, noting that sherbet makes the yogurt more palatable. She notes she loves to eat broccoli. Pt reports she eats out 4 times per week where she may order a salad with chicken kabob with fries and a drink.   Pt reports she is not checking blood sugar but was advised by her doctor to check it 3 times daily. She notes she will have it checked again next during a medical appointment.  Pt reports she started Jardiance in past 3-4 weeks and has lost 3 pounds with no other side effects. She reports she has been on metformin 5 years with no side effects. Pt reports she has been experiencing irregular menstural cycle for 2 weeks and has a history of fibroids. She reports she walks while working and outside of work for 30 minutes.  Diet recall:  Breakfast: 0.5 cup Oatmeal with sugar 1 tbsp 8-10oz water  Lunch: salad baked chicken kabob ranch dressing fries small  Dinner: grilled cheese ham with 1 can chicken soup   Beverages: 4oz regular coke occasionally 4oz zero coke occas likes  carbonation in soda   Typical day of eating no snacks 3 tall waters at work peach Activia yogurt  Typically eaten foods: chicken kabob with salad and fries, fish, hamburger, shrimp, smoothie with strawberry and banana with protein powder, silk almond milk, strawberry and blueberry alone or with food, jello, unsweet tea w/ artificial sweetener     Diabetes Self-Management Education - 08/01/19 0902      Visit Information   Visit Type  First/Initial      Initial Visit   Diabetes Type  Type 2      Psychosocial Assessment   Learning Readiness  Ready    How often do you need to have someone help you when you read instructions, pamphlets, or other written materials from your doctor or pharmacy?  1 - Never    What is the last grade level you completed in school?  college      Complications   Last HgB A1C per patient/outside source  12.1 %    How often do you check your blood sugar?  0 times/day (not testing)    Number of hypoglycemic episodes per month  0    Number of hyperglycemic episodes per week  0    Have you had a dilated eye exam in the past 12 months?  Yes    Have you had a dental exam in the past 12 months?  Yes    Are you checking your feet?  No  Exercise   Exercise Type  Light (walking / raking leaves)    How many days per week to you exercise?  5    How many minutes per day do you exercise?  30    Total minutes per week of exercise  150      Patient Education   Previous Diabetes Education  No    Disease state   Explored patient's options for treatment of their diabetes    Nutrition management   Carbohydrate counting;Role of diet in the treatment of diabetes and the relationship between the three main macronutrients and blood glucose level    Physical activity and exercise   Role of exercise on diabetes management, blood pressure control and cardiac health.      Post-Education Assessment   Patient understands the diabetes disease and treatment process.  Needs  Instruction    Patient understands incorporating nutritional management into lifestyle.  Demonstrates understanding / competency    Patient undertands incorporating physical activity into lifestyle.  Demonstrates understanding / competency    Patient understands using medications safely.  Demonstrates understanding / competency    Patient understands monitoring blood glucose, interpreting and using results  Needs Review    Patient understands prevention, detection, and treatment of acute complications.  Demonstrates understanding / competency    Patient understands prevention, detection, and treatment of chronic complications.  Demonstrates understanding / competency    Patient understands how to develop strategies to address psychosocial issues.  Demonstrates understanding / competency    Patient understands how to develop strategies to promote health/change behavior.  Demonstrates understanding / competency      Outcomes   Expected Outcomes  Demonstrated interest in learning. Expect positive outcomes    Future DMSE  4-6 wks       Individualized Plan for Diabetes Self-Management Training:   Learning Objective:  Patient will have a greater understanding of diabetes self-management. Patient education plan is to attend individual and/or group sessions per assessed needs and concerns.  Expected Outcomes:  Demonstrated interest in learning. Expect positive outcomes  Education material provided: ADA - How to Thrive: A Guide for Your Journey with Diabetes and Carbohydrate counting sheet  If problems or questions, patient to contact team via:  Phone  Future DSME appointment: 4-6 wks

## 2019-08-02 ENCOUNTER — Ambulatory Visit: Payer: 59 | Attending: Family

## 2019-08-02 DIAGNOSIS — Z23 Encounter for immunization: Secondary | ICD-10-CM

## 2019-08-02 NOTE — Progress Notes (Signed)
   Covid-19 Vaccination [facility]  Name:  Linda Conrad    MRN: [id number] DOB: September 08, 1975  08/02/2019  Linda Conrad was observed post Covid-19 immunization for 15 minutes without incident. She was provided with Vaccine Information Sheet and instruction to access the V-Safe system.   Linda Conrad was instructed to call 911 with any severe reactions post vaccine: Marland Kitchen Difficulty breathing  . Swelling of face and throat  . A fast heartbeat  . A bad rash all over body  . Dizziness and weakness   Immunizations Administered    Name Date Dose VIS Date Route   Moderna COVID-19 Vaccine 08/02/2019  3:46 PM 0.5 mL 05/01/2019 Intramuscular   Manufacturer: Moderna   Lot: [id number]   [id number]

## 2019-08-03 IMAGING — MG DIGITAL SCREENING BILATERAL MAMMOGRAM WITH TOMO AND CAD
8 of 15 series · 8 of 40 positions shown · non-contrast
Comparison: Previous exam(s).

CLINICAL DATA: Screening.

EXAM:
DIGITAL SCREENING BILATERAL MAMMOGRAM WITH TOMO AND CAD

[R MLO synth-2D (1 of 2)]
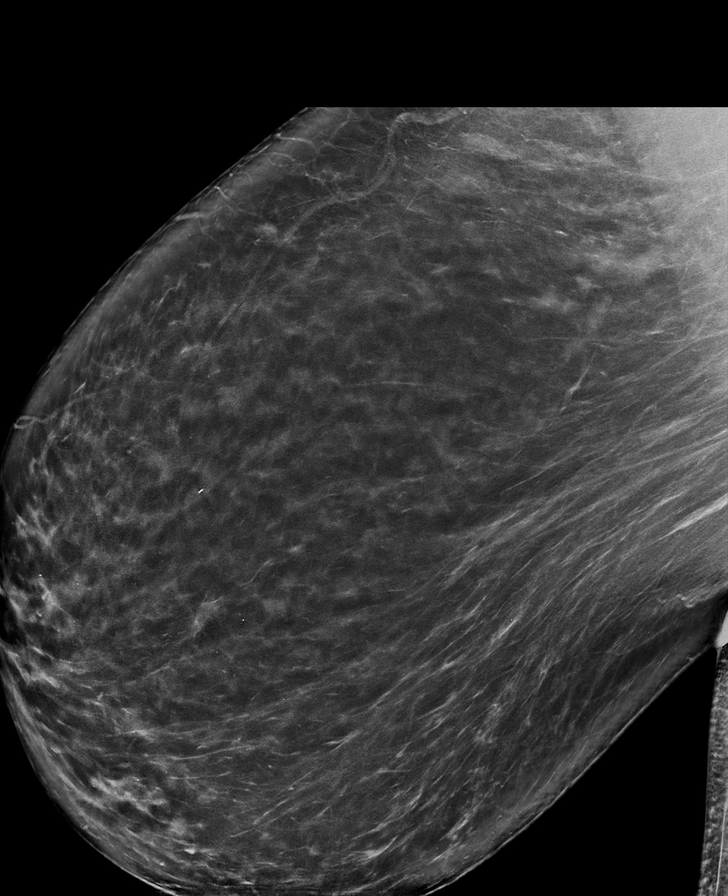

[R CC synth-2D (1 of 2)]
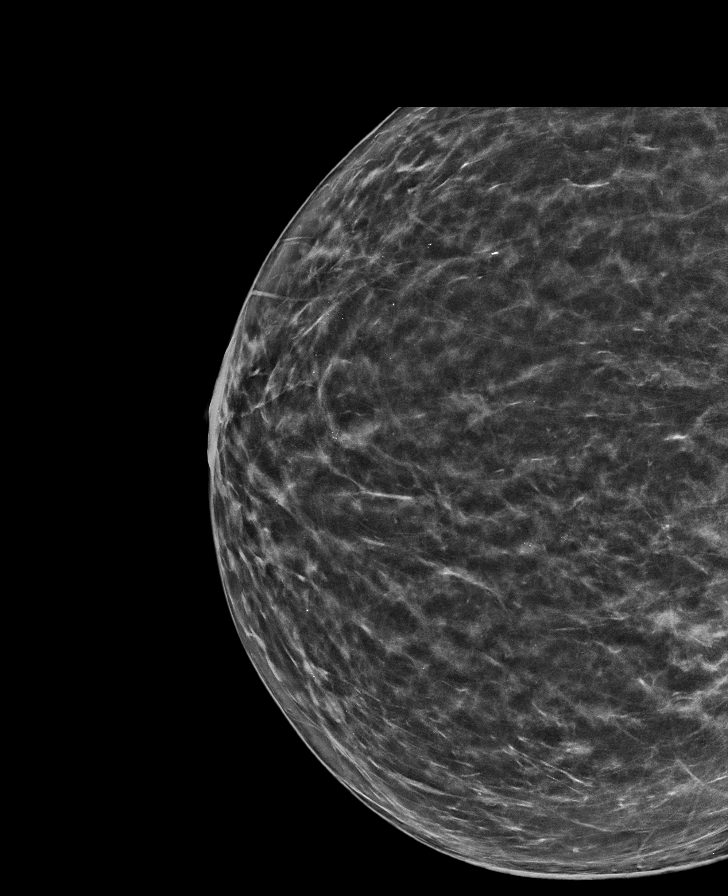

[L MLO synth-2D]
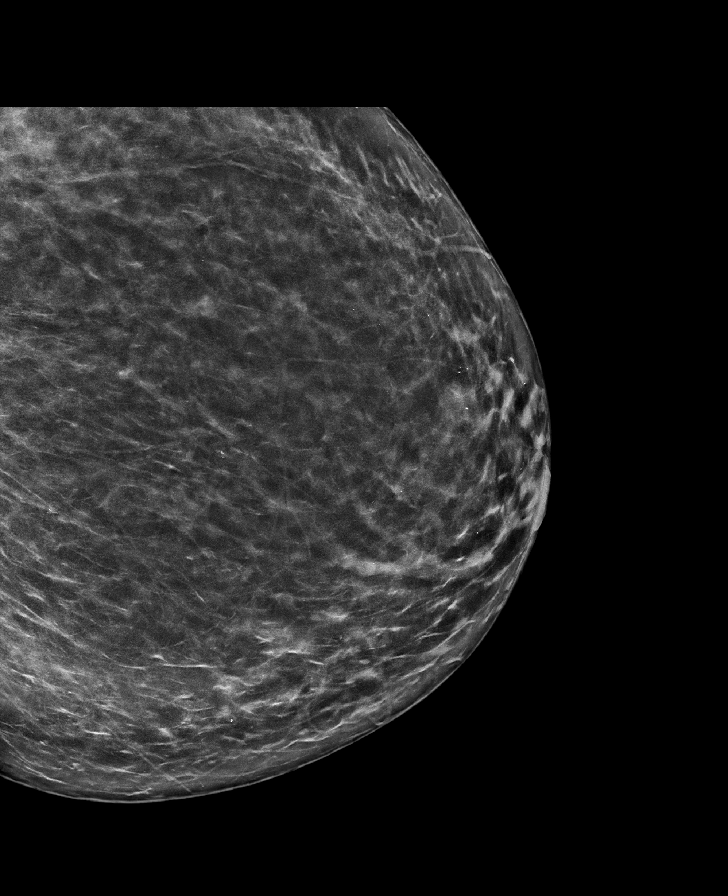

[R CC synth-2D (2 of 2)]
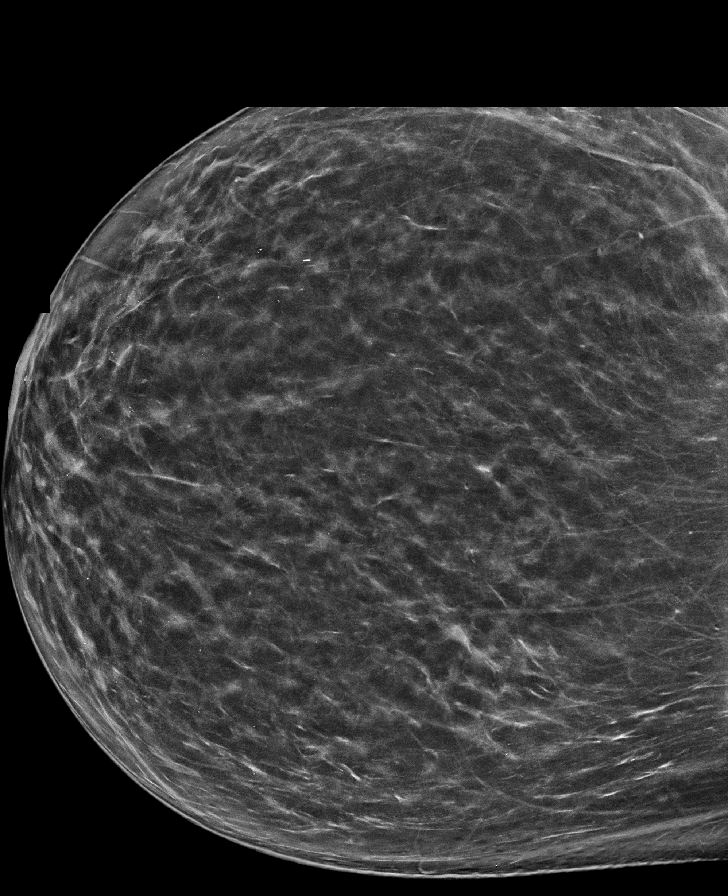

[L CC synth-2D (1 of 2)]
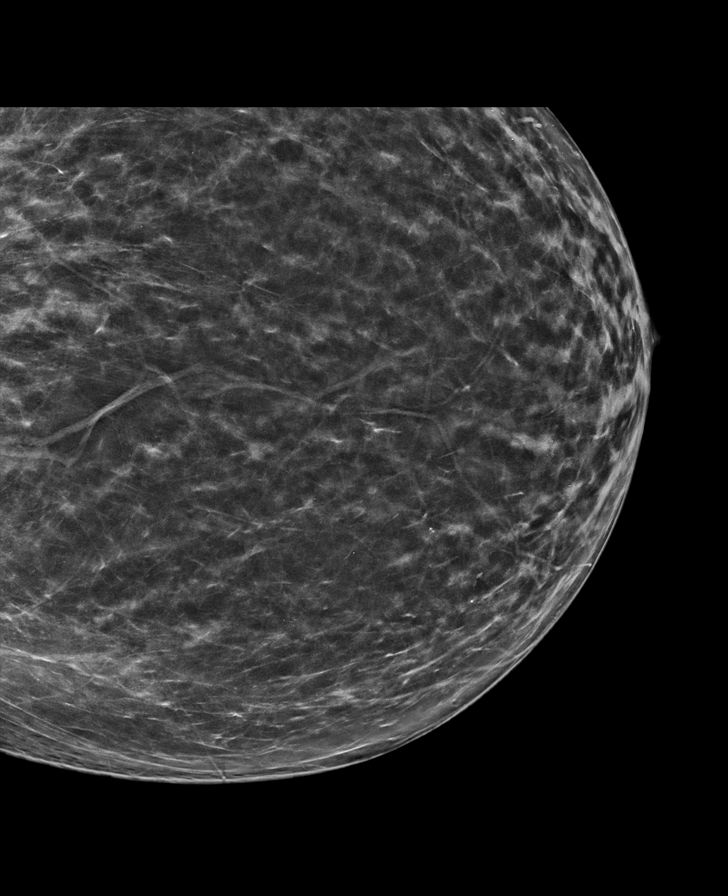

[R MLO synth-2D (2 of 2)]
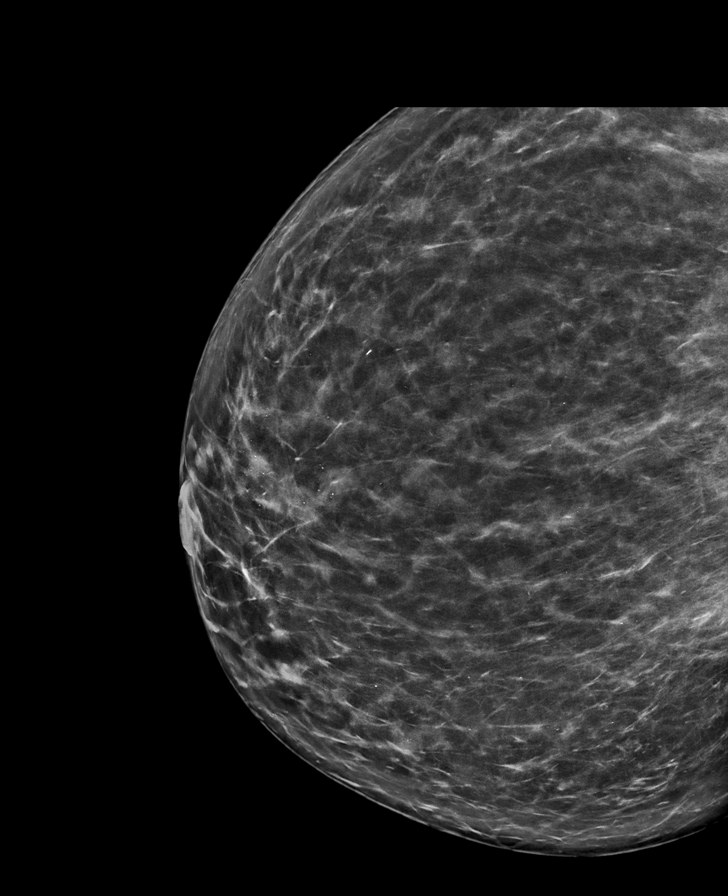

[L CC synth-2D (2 of 2)]
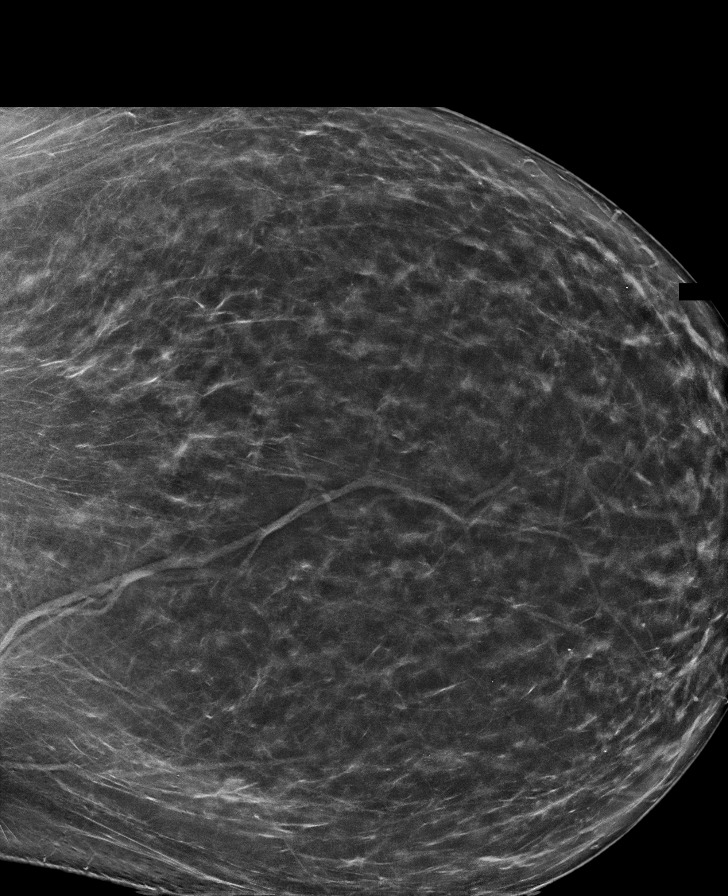

[L CC tomo · tomo slice 54/79.0]
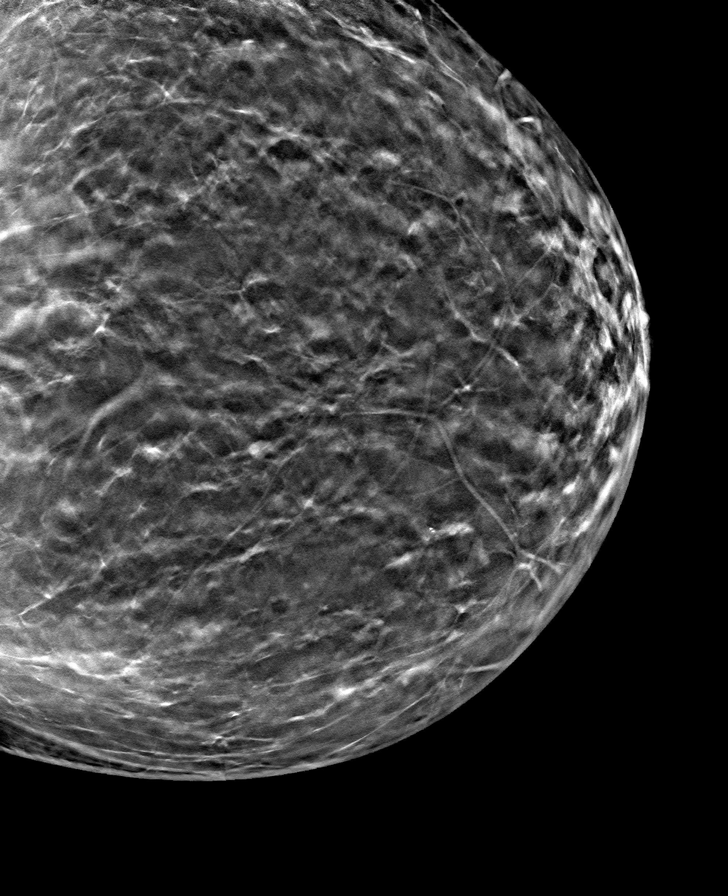

[8 of 40 positions shown; findings below may reference images not displayed]

ACR Breast Density Category b: There are scattered areas of
fibroglandular density.
FINDINGS: There are no findings suspicious for malignancy. Images were
processed with CAD.
IMPRESSION: No mammographic evidence of malignancy. A result letter of this
screening mammogram will be mailed directly to the patient.

RECOMMENDATION:
Screening mammogram in one year. (Code:[id number])

BI-RADS CATEGORY  1: Negative.

## 2019-08-06 NOTE — Telephone Encounter (Signed)
Attempted to call pt to discuss AUB. pts voicemail box full. Will send pt mychart message to call back to office to speak with triage RN.

## 2019-08-08 NOTE — Telephone Encounter (Signed)
Encounter closed

## 2019-08-08 NOTE — Telephone Encounter (Signed)
Patient has not returned call. Spoke with patient on 3/1, she was unable to talk at the time, stated she would return call to the office.   MyChart message to patient on 3/8.   Dr. Quincy Simmonds -ok to close encounter?

## 2019-08-10 ENCOUNTER — Other Ambulatory Visit: Payer: Self-pay | Admitting: Family Medicine

## 2019-08-15 ENCOUNTER — Other Ambulatory Visit: Payer: Self-pay

## 2019-08-15 ENCOUNTER — Telehealth: Payer: 59 | Admitting: Family Medicine

## 2019-08-15 ENCOUNTER — Encounter: Payer: Self-pay | Admitting: Family Medicine

## 2019-08-15 ENCOUNTER — Telehealth: Payer: Self-pay | Admitting: *Deleted

## 2019-08-15 ENCOUNTER — Telehealth (INDEPENDENT_AMBULATORY_CARE_PROVIDER_SITE_OTHER): Payer: 59 | Admitting: Family Medicine

## 2019-08-15 DIAGNOSIS — I152 Hypertension secondary to endocrine disorders: Secondary | ICD-10-CM

## 2019-08-15 DIAGNOSIS — E1165 Type 2 diabetes mellitus with hyperglycemia: Secondary | ICD-10-CM

## 2019-08-15 DIAGNOSIS — I10 Essential (primary) hypertension: Secondary | ICD-10-CM

## 2019-08-15 DIAGNOSIS — E1159 Type 2 diabetes mellitus with other circulatory complications: Secondary | ICD-10-CM | POA: Diagnosis not present

## 2019-08-15 NOTE — Telephone Encounter (Signed)
-----   Message from Caren Macadam, MD sent at 08/15/2019  3:49 PM EDT ----- Lab work 5/8 or after. Visit to follow.

## 2019-08-15 NOTE — Progress Notes (Signed)
Virtual Visit via Video Note  I connected with Linda Conrad  on 08/15/19 at  3:00 PM EDT by a video enabled telemedicine application and verified that I am speaking with the correct person using two identifiers.  Location patient: home Location provider:work or home office Persons participating in the virtual visit: patient, provider  I discussed the limitations of evaluation and management by telemedicine and the availability of in person appointments. The patient expressed understanding and agreed to proceed.   Linda Conrad DOB: Feb 17, 1976 Encounter date: 08/15/2019  This is a 44 y.o. female who presents with Chief Complaint  Patient presents with  . Follow-up    History of present illness:  HTN: not checking blood pressures regularly.   DMII: hasn't been checking blood sugar because there was delay in her getting meter. Has been on the jardiance for a month. Feels well overall. No sx of low sugars. Paying attention to portions, watching sugar intake. Drinking more water. Only sugar free drink if she does have something. Drinking mini can if she does have something. Doing ok with the metformin.   Has been trying to walk in evenings. [place] Does this twice/week and then on both days of weekends. Walked on [address] last week.   Hasn't had much sweet tea, but if she does it is no sugar.   Feeling better since starting the jardiance. Energy level feels better.   Doing better with frequent urination. Drinking 2L of water daily.    Allergies  Allergen Reactions  . Oak Bark [Quercus [place] trees  . Soap Rash    Everything except Dove   Current Meds  Medication Sig  . acetaminophen (TYLENOL) 650 MG CR tablet Take 650 mg by mouth every 8 (eight) hours as needed for pain.  . blood glucose meter kit and supplies KIT Dispense based on patient and insurance preference. Use up to three times daily as directed. (FOR ICD-9 250.00, 250.01).  .  CINNAMON PO Take by mouth.  . ELDERBERRY PO Take by mouth.  . empagliflozin (JARDIANCE) 10 MG TABS tablet Take 10 mg by mouth daily before breakfast.  . fluticasone (FLONASE) 50 MCG/ACT nasal spray Place into both nostrils daily.  . hydrochlorothiazide (HYDRODIURIL) 25 MG tablet TAKE ONE TABLET BY MOUTH DAILY  . IBUPROFEN PO Take by mouth as needed. Knee pain  . Lancets (ONETOUCH DELICA PLUS [id number]) MISC ONE - THREE TIMES A DAY AS DIRECTED  . losartan (COZAAR) 50 MG tablet TAKE ONE TABLET BY MOUTH DAILY  . metFORMIN (GLUCOPHAGE) 500 MG tablet TAKE ONE TABLET BY MOUTH TWICE A DAY WITH A MEAL (Patient taking differently: daily with breakfast. )  . mometasone (ELOCON) 0.1 % cream Apply 1 application topically daily.  . Multiple Vitamins-Minerals (ZINC PO) Take by mouth.  . NORLYDA 0.35 MG tablet Take 1 tablet (0.35 mg total) by mouth daily.  . Omega-3 Fatty Acids (FISH OIL) 1000 MG CAPS Take by mouth.  Marland Kitchen omeprazole (PRILOSEC) 20 MG capsule TAKE ONE CAPSULE BY MOUTH DAILY  . OVER THE COUNTER MEDICATION Calcium 676m with vitamin D3  . SALINE NASAL SPRAY NA Place into the nose as needed.  . triamcinolone cream (KENALOG) 0.1 % Apply topically daily.  . TURMERIC CURCUMIN PO Take by mouth.  . valACYclovir (VALTREX) 500 MG tablet TAKE ONE TABLET BY MOUTH DAILY    Review of Systems  Constitutional: Negative for chills, fatigue and fever.  Respiratory: Negative for cough,  chest tightness, shortness of breath and wheezing.   Cardiovascular: Negative for chest pain, palpitations and leg swelling.    Objective:  LMP 07/20/2019 (Exact Date)       BP Readings from Last 3 Encounters:  07/09/19 108/78  05/10/19 110/60  01/08/19 110/70   Wt Readings from Last 3 Encounters:  08/01/19 200 lb 12.8 oz (91.1 kg)  07/09/19 200 lb 9.6 oz (91 kg)  05/10/19 203 lb (92.1 kg)    EXAM:  GENERAL: alert, oriented, appears well and in no acute distress  HEENT: atraumatic, conjunctiva clear, no obvious  abnormalities on inspection of external nose and ears  NECK: normal movements of the head and neck  LUNGS: on inspection no signs of respiratory distress, breathing rate appears normal, no obvious gross SOB, gasping or wheezing  CV: no obvious cyanosis  MS: moves all visible extremities without noticeable abnormality  PSYCH/NEURO: pleasant and cooperative, no obvious depression or anxiety, speech and thought processing grossly intact   Assessment/Plan 1. Type 2 diabetes mellitus with hyperglycemia, without long-term current use of insulin (HCC) She has not been checking her blood sugars, but she is eating and drinking differently and paying attention to carbohydrates in her diet.  I did encourage her to start checking her sugars.  We will have her follow-up in 2 months for recheck of A1c and office visit. - Hemoglobin A1c; Future - Comprehensive metabolic panel; Future - CBC with Differential/Platelet; Future  2. Hypertension associated with diabetes ([place]) Has been well controlled.  Continue current medications.   Return in about 2 months (around 10/15/2019) for Chronic condition visit with bloodwork prior to visit.   I discussed the assessment and treatment plan with the patient. The patient was provided an opportunity to ask questions and all were answered. The patient agreed with the plan and demonstrated an understanding of the instructions.   The patient was advised to call back or seek an in-person evaluation if the symptoms worsen or if the condition fails to improve as anticipated.  I provided 33 minutes of non-face-to-face time during this encounter.   Micheline Rough, MD

## 2019-08-15 NOTE — Telephone Encounter (Signed)
Spoke with the pt and scheduled appts as below.  

## 2019-08-17 ENCOUNTER — Encounter: Payer: Self-pay | Admitting: Certified Nurse Midwife

## 2019-08-29 ENCOUNTER — Encounter: Payer: Self-pay | Admitting: Dietician

## 2019-08-29 ENCOUNTER — Encounter: Payer: 59 | Admitting: Dietician

## 2019-08-29 ENCOUNTER — Other Ambulatory Visit: Payer: Self-pay

## 2019-08-29 DIAGNOSIS — E119 Type 2 diabetes mellitus without complications: Secondary | ICD-10-CM | POA: Diagnosis not present

## 2019-08-29 NOTE — Progress Notes (Signed)
Diabetes Self-Management Education  Visit Type: Follow-up  08/29/2019  [name], identified by name and date of birth, is a 44 y.o. female with a diagnosis of Diabetes: Type 2.   ASSESSMENT  Patient has been keeping a food log and started checking BG regularly August 19, 2019 (about 1 1/2 weeks now.) Patient reported BG readings include:  - 2 hrs after meal (biscuit + large Cheerwine) 230   - 2 hrs after meal (roast + veggies + mashed potatoes + diet tea) 169  - 2 hrs after meal (1/2 chicken sub + Sprite) 177 - fasting 151, 195, 175 Patient states she has been working on reducing portion sizes in general, especially of carbohydrate foods. For example, only eating a few bites of potatoes instead of the whole serving, and having extra broccoli instead. Asked about the best type of oatmeal to eat and other sources of sweeteners, such as honey.     Diabetes Self-Management Education - 08/29/19 0916      Visit Information   Visit Type  Follow-up      Complications   How often do you check your blood sugar?  1-2 times/day    Fasting Blood glucose range (mg/dL)  130-179;180-200    Postprandial Blood glucose range (mg/dL)  130-179;180-200;>200      Dietary Intake   Breakfast  1/2 cup oatmeal + cranberries + 4 oz OJ    Lunch  1/2 chicken sub + Sprite    Dinner  roast + veggies + mashed potatoes + diet tea    Beverage(s)  Cheerwine, Coke, Sprite, water, OJ, almond milk      Patient Education   Nutrition management   Reviewed blood glucose goals for pre and post meals and how to evaluate the patients' food intake on their blood glucose level.    Monitoring  Taught/evaluated SMBG meter.;Purpose and frequency of SMBG.;Taught/discussed recording of test results and interpretation of SMBG.;Identified appropriate SMBG and/or A1C goals.      Individualized Goals (developed by patient)   Nutrition  Follow meal plan discussed   Include protein with all meals and snacks   Monitoring    test my blood glucose as discussed      Outcomes   Expected Outcomes  Demonstrated interest in learning. Expect positive outcomes    Future DMSE  4-6 wks      Subsequent Visit   Since your last visit have you continued or begun to take your medications as prescribed?  Yes    Since your last visit have you had your blood pressure checked?  No    Since your last visit have you experienced any weight changes?  No change    Since your last visit, are you checking your blood glucose at least once a day?  Yes       Individualized Plan for Diabetes Self-Management Training:   Learning Objective:  Patient will have a greater understanding of diabetes self-management. Patient education plan is to attend individual and/or group sessions per assessed needs and concerns.   Plan:  Patient Instructions   Remember to include a source of protein with all meals and snacks. Use the Breakfast Ideas sheet for balanced breakfast ideas!  Continue checking your blood sugar regularly and writing down what you eat to see how foods/drinks affect your blood sugar.    Expected Outcomes:  Demonstrated interest in learning. Expect positive outcomes  Education material provided: Breakfast Ideas  If problems or questions, patient to contact team via:  Phone and Email  Future DSME appointment: 4-6 wks

## 2019-08-29 NOTE — Patient Instructions (Signed)
   Remember to include a source of protein with all meals and snacks. Use the Breakfast Ideas sheet for balanced breakfast ideas!  Continue checking your blood sugar regularly and writing down what you eat to see how foods/drinks affect your blood sugar.

## 2019-09-04 ENCOUNTER — Ambulatory Visit: Payer: 59 | Attending: Family

## 2019-09-04 DIAGNOSIS — Z23 Encounter for immunization: Secondary | ICD-10-CM

## 2019-09-04 NOTE — Progress Notes (Signed)
   Covid-19 Vaccination Clinic  Name:  Linda Conrad    MRN: [id number] DOB: 07/19/1975  09/04/2019  [name] was observed post Covid-19 immunization for 15 minutes without incident. She was provided with Vaccine Information Sheet and instruction to access the V-Safe system.   [name] was instructed to call 911 with any severe reactions post vaccine: Marland Kitchen Difficulty breathing  . Swelling of face and throat  . A fast heartbeat  . A bad rash all over body  . Dizziness and weakness   Immunizations Administered    Name Date Dose VIS Date Route   Moderna COVID-19 Vaccine 09/04/2019  3:21 PM 0.5 mL 05/01/2019 Intramuscular   Manufacturer: Moderna   Lot: [id number]   [id number]

## 2019-09-13 ENCOUNTER — Ambulatory Visit: Payer: 59 | Admitting: Obstetrics and Gynecology

## 2019-09-26 ENCOUNTER — Ambulatory Visit: Payer: 59 | Admitting: Dietician

## 2019-09-28 ENCOUNTER — Other Ambulatory Visit: Payer: Self-pay

## 2019-10-01 ENCOUNTER — Ambulatory Visit: Payer: 59 | Admitting: Obstetrics and Gynecology

## 2019-10-01 ENCOUNTER — Other Ambulatory Visit: Payer: Self-pay

## 2019-10-01 ENCOUNTER — Other Ambulatory Visit (HOSPITAL_COMMUNITY)
Admission: RE | Admit: 2019-10-01 | Discharge: 2019-10-01 | Disposition: A | Discharge status: Home / Self Care | Payer: 59 | Source: Ambulatory Visit | Attending: Obstetrics and Gynecology | Admitting: Obstetrics and Gynecology

## 2019-10-01 ENCOUNTER — Encounter: Payer: Self-pay | Admitting: Obstetrics and Gynecology

## 2019-10-01 VITALS — BP 120/70 | HR 76 | Temp 97.2°F | Resp 10 | Ht 67.0 in | Wt 203.0 lb

## 2019-10-01 DIAGNOSIS — Z01419 Encounter for gynecological examination (general) (routine) without abnormal findings: Secondary | ICD-10-CM | POA: Diagnosis not present

## 2019-10-01 DIAGNOSIS — Z1211 Encounter for screening for malignant neoplasm of colon: Secondary | ICD-10-CM

## 2019-10-01 DIAGNOSIS — Z113 Encounter for screening for infections with a predominantly sexual mode of transmission: Secondary | ICD-10-CM

## 2019-10-01 MED ORDER — NORETHINDRONE 0.35 MG PO TABS: 1.0000 | ORAL_TABLET | Freq: Every day | ORAL | 3 refills | Status: DC

## 2019-10-01 NOTE — Progress Notes (Signed)
44 y.o. G0P0000 Single African American female here for annual exam.     Had a prolonged menstrual cycle in March.  April cycle was normal.   Last A1C was 12.1. She will have this rechecked with her PCP.   Has been vaccinated against Covid.   PCP: Micheline Rough, MD  Patient's last menstrual period was 09/15/2019.           Sexually active: Yes.    The current method of family planning is [place].    Exercising: Yes.    bootcamp and walking Smoker:  no  Health Maintenance: Pap: 11/28/18 Colpo showed low grade dysplasia of cervix:ECC benign; 10/31/18 Neg:Pos HR HPV, Pos Subtype 16; 7/11/17Neg:Neg HR HPV; 05/21/14 Neg:Neg HR HPV; hx HPV on prior pap 2014 History of abnormal Pap:  yes MMG:  12/14/18 BIRADS 1 negative/density b Colonoscopy:  n/a BMD:   n/a  Result  n/a TDaP:  2014 Gardasil:   yes HIV: 05/21/14 Negative Hep C:never Screening Labs:  PCP   reports that she has never smoked. She has never used smokeless tobacco. She reports current alcohol use. She reports that she does not use drugs.  Past Medical History:  Diagnosis Date  . Abnormal Pap smear of cervix - hpv per report in 2014, seeing gyn 11/16/2013   normal pap with gyn 2015  . Arthritis    hand  . CIN I (cervical intraepithelial neoplasia I) 2020  . Diabetes mellitus without complication ([place])   . Family history of bladder cancer   . Family history of breast cancer   . Family history of kidney cancer   . Family history of ovarian cancer   . Family history of prostate cancer   . Foreign body of hand, right 07/2011   right MP  . GERD (gastroesophageal reflux disease)   . Headache(784.0)    sinus  . HSV-1 (herpes simplex virus 1) infection   . Hypertension    denies HTN, but takes Hyzaar   . Stenosing tenosynovitis of wrist 07/2011   right    Past Surgical History:  Procedure Laterality Date  . CYST EXCISION    . DORSAL COMPARTMENT RELEASE  08/26/2011   Procedure: RELEASE DORSAL COMPARTMENT  (DEQUERVAIN);  Surgeon: Cammie Sickle., MD;  Location: [facility];  Service: Orthopedics;  Laterality: Right;  . FOREIGN BODY REMOVAL  08/26/2011   Procedure: FOREIGN BODY REMOVAL ADULT;  Surgeon: Cammie Sickle., MD;  Location: [facility];  Service: Orthopedics;  Laterality: Right;    Current Outpatient Medications  Medication Sig Dispense Refill  . acetaminophen (TYLENOL) 650 MG CR tablet Take 650 mg by mouth every 8 (eight) hours as needed for pain.    . blood glucose meter kit and supplies KIT Dispense based on patient and insurance preference. Use up to three times daily as directed. (FOR ICD-9 250.00, 250.01). 1 each 0  . CINNAMON PO Take by mouth.    . ELDERBERRY PO Take by mouth.    . empagliflozin (JARDIANCE) 10 MG TABS tablet Take 10 mg by mouth daily before breakfast. 90 tablet 1  . fluticasone (FLONASE) 50 MCG/ACT nasal spray Place into both nostrils daily.    . hydrochlorothiazide (HYDRODIURIL) 25 MG tablet TAKE ONE TABLET BY MOUTH DAILY 30 tablet 3  . IBUPROFEN PO Take by mouth as needed. Knee pain    . Lancets (ONETOUCH DELICA PLUS [id number]) MISC ONE - THREE TIMES A DAY AS DIRECTED 100 each 3  .  losartan (COZAAR) 50 MG tablet TAKE ONE TABLET BY MOUTH DAILY 30 tablet 3  . metFORMIN (GLUCOPHAGE) 500 MG tablet TAKE ONE TABLET BY MOUTH TWICE A DAY WITH A MEAL (Patient taking differently: daily with breakfast. ) 60 tablet 3  . mometasone (ELOCON) 0.1 % cream Apply 1 application topically daily. 45 g 1  . Multiple Vitamins-Minerals (ZINC PO) Take by mouth.    . NORLYDA 0.35 MG tablet Take 1 tablet (0.35 mg total) by mouth daily. 3 Package 3  . Omega-3 Fatty Acids (FISH OIL) 1000 MG CAPS Take by mouth.    Marland Kitchen omeprazole (PRILOSEC) 20 MG capsule TAKE ONE CAPSULE BY MOUTH DAILY 90 capsule 1  . SALINE NASAL SPRAY NA Place into the nose as needed.    . triamcinolone cream (KENALOG) 0.1 % Apply topically daily. 30 g 1  . TURMERIC CURCUMIN PO Take by  mouth.    . valACYclovir (VALTREX) 500 MG tablet TAKE ONE TABLET BY MOUTH DAILY 30 tablet 2  . OVER THE COUNTER MEDICATION Calcium 661m with vitamin D3     No current facility-administered medications for this visit.    Family History  Problem Relation Age of Onset  . Hyperlipidemia Mother   . Hypertension Mother   . Hypertension Father   . Stroke Father   . Breast cancer Paternal Grandmother        Age 7653sor 592s . Diabetes Mellitus II Paternal Grandmother   . Dementia Paternal Grandmother   . Stroke Paternal Grandfather   . Hypertension Sister   . Diabetes Sister   . Arthritis Maternal Grandmother   . Heart failure Maternal Grandfather   . Diabetes Maternal Grandfather   . Cancer Paternal Aunt        bladder or kidney cancer, Age 45/58  . Colon cancer Other        maternal great-aunt, in her 719s . Ovarian cancer Cousin 228      maternal uncle's daughter  . Ovarian cancer Other        matrenal great-aunt, in her 75s   Review of Systems  Constitutional: Negative.   HENT: Negative.   Eyes: Negative.   Respiratory: Negative.   Cardiovascular: Negative.   Gastrointestinal: Negative.   Endocrine: Negative.   Genitourinary: Negative.   Musculoskeletal: Negative.   Skin: Negative.   Allergic/Immunologic: Negative.   Neurological: Negative.   Hematological: Negative.   Psychiatric/Behavioral: Negative.     Exam:   BP 120/70 (BP Location: Left Arm, Patient Position: Sitting, Cuff Size: Normal)   Pulse 76   Temp (!) 97.2 F (36.2 C) (Temporal)   Resp 10   Ht '5\' 7"'  (1.702 m)   Wt 203 lb (92.1 kg)   LMP 09/15/2019   BMI 31.79 kg/m     General appearance: alert, cooperative and appears stated age Head: normocephalic, without obvious abnormality, atraumatic Neck: no adenopathy, supple, symmetrical, trachea midline and thyroid normal to inspection and palpation Lungs: clear to auscultation bilaterally Breasts: normal appearance, no masses or tenderness, No  nipple retraction or dimpling, No nipple discharge or bleeding, No axillary adenopathy Heart: regular rate and rhythm Abdomen: soft, non-tender; no masses, no organomegaly Extremities: extremities normal, atraumatic, no cyanosis or edema Skin: skin color, texture, turgor normal. No rashes or lesions Lymph nodes: cervical, supraclavicular, and axillary nodes normal. Neurologic: grossly normal  Pelvic: External genitalia:  no lesions              No abnormal inguinal  nodes palpated.              Urethra:  normal appearing urethra with no masses, tenderness or lesions              Bartholins and Skenes: normal                 Vagina: normal appearing vagina with normal color and discharge, no lesions              Cervix: no lesions              Pap taken: Yes.   Bimanual Exam:  Uterus:  normal size, contour, position, consistency, mobility, non-tender              Adnexa: no mass, fullness, tenderness              Rectal exam: Yes.  .  Confirms.              Anus:  normal sphincter tone, no lesions  Chaperone was present for exam.  Assessment:   Well woman visit with normal exam. Hx positive LGSIL and positive HR HPV.  Hx HSV 1. DM. FH cancers.  Negative genetic testing.   Variants of unknown significance noted.   Plan: Mammogram screening discussed. Self breast awareness reviewed. Pap and HR HPV as above. Guidelines for Calcium, Vitamin D, regular exercise program including cardiovascular and weight bearing exercise. STD screening.  IFOB yearly until age 4 and then start colonoscopy screening.  Refill of Micronor.  I suggested she ask to have nutrition counseling for her DM.  Follow up annually and prn.    After visit summary provided.

## 2019-10-01 NOTE — Patient Instructions (Signed)

## 2019-10-02 LAB — HEPATITIS C ANTIBODY: Hep C Virus Ab: <0.1 s/co ratio (ref 0.0–0.9)

## 2019-10-02 LAB — HEP, RPR, HIV PANEL
HIV Screen 4th Generation wRfx: NONREACTIVE
Hepatitis B Surface Ag: NEGATIVE
RPR Ser Ql: NONREACTIVE

## 2019-10-03 LAB — CYTOLOGY - PAP
Chlamydia: NEGATIVE
Comment: NEGATIVE
Comment: NEGATIVE
Comment: NEGATIVE
Comment: NORMAL
Diagnosis: NEGATIVE
High risk HPV: POSITIVE — AB
Neisseria Gonorrhea: NEGATIVE
Trichomonas: NEGATIVE

## 2019-10-05 ENCOUNTER — Other Ambulatory Visit: Payer: Self-pay | Admitting: Family Medicine

## 2019-10-07 ENCOUNTER — Other Ambulatory Visit: Payer: Self-pay | Admitting: Family Medicine

## 2019-10-07 ENCOUNTER — Other Ambulatory Visit: Payer: Self-pay | Admitting: Obstetrics and Gynecology

## 2019-10-07 ENCOUNTER — Encounter: Payer: Self-pay | Admitting: Obstetrics and Gynecology

## 2019-10-08 ENCOUNTER — Telehealth: Payer: Self-pay

## 2019-10-08 ENCOUNTER — Other Ambulatory Visit: Payer: 59

## 2019-10-08 NOTE — Telephone Encounter (Signed)
Tried calling patient with results as seen below. No answer, unable to leave a voicemail due to patient's mailbox being full.

## 2019-10-08 NOTE — Telephone Encounter (Signed)
-----   Message from Nunzio Cobbs, MD sent at 10/07/2019  8:12 AM EDT ----- Please contact patient with results of her testing.  Her pap shows positive HR HPV and the cells are normal.  By [facility] guidedlines, her next cervical cancer screening is due in 12 months.  Please place this recall.   Her STD testing is negative for gonorrhea, chlamydia and trichomonas.   I am highlighting this result so you know to contact the patient.

## 2019-10-09 NOTE — Telephone Encounter (Signed)
Results reviewed in detail with patient. Patient verbalizes understanding and agreeable. Closing encounter.

## 2019-10-12 ENCOUNTER — Other Ambulatory Visit: Payer: Self-pay

## 2019-10-15 ENCOUNTER — Other Ambulatory Visit: Payer: Self-pay

## 2019-10-15 ENCOUNTER — Ambulatory Visit (INDEPENDENT_AMBULATORY_CARE_PROVIDER_SITE_OTHER): Payer: 59 | Admitting: Family Medicine

## 2019-10-15 ENCOUNTER — Encounter: Payer: Self-pay | Admitting: Family Medicine

## 2019-10-15 VITALS — BP 120/82 | HR 90 | Temp 97.6°F | Ht 67.0 in | Wt 204.9 lb

## 2019-10-15 DIAGNOSIS — E1159 Type 2 diabetes mellitus with other circulatory complications: Secondary | ICD-10-CM | POA: Diagnosis not present

## 2019-10-15 DIAGNOSIS — I152 Hypertension secondary to endocrine disorders: Secondary | ICD-10-CM

## 2019-10-15 DIAGNOSIS — I10 Essential (primary) hypertension: Secondary | ICD-10-CM | POA: Diagnosis not present

## 2019-10-15 DIAGNOSIS — E119 Type 2 diabetes mellitus without complications: Secondary | ICD-10-CM

## 2019-10-15 DIAGNOSIS — Z13220 Encounter for screening for lipoid disorders: Secondary | ICD-10-CM | POA: Diagnosis not present

## 2019-10-15 LAB — POCT GLYCOSYLATED HEMOGLOBIN (HGB A1C): Hemoglobin A1C: 7 % — AB (ref 4.0–5.6)

## 2019-10-15 NOTE — Progress Notes (Signed)
Kelton Pillar DOB: 1976-05-31 Encounter date: 10/15/2019  This is a 44 y.o. female who presents with Chief Complaint  Patient presents with  . Follow-up    History of present illness: Doing ok overall. She is in boot camp on Mondays and weds. She is moving more.   DM: this morning was 153. Trying to stay hydrated. This morning had eggs, sausage, english muffin. Still doing more excess starches, but has cut out some of pancakes, sandwiches. Doing more salads. Doing coke zero or doing mini cans of soda. Doesn't always finish them all. Trying to lean more towards sparkling ice drinks. Fasting sugars before breakfast: 230, 169, 151, 177 (after lunch), 195, 118, 98, 118, 118, 176, 175, 138, 182, 163, 153, 153. Just doing one metformin once in the morning because the twice daily is too harsh on stomach.   Hits 6000 steps most days.     Allergies  Allergen Reactions  . Oak Bark [Quercus [place] trees  . Soap Rash    Everything except Dove   Current Meds  Medication Sig  . acetaminophen (TYLENOL) 650 MG CR tablet Take 650 mg by mouth every 8 (eight) hours as needed for pain.  . blood glucose meter kit and supplies KIT Dispense based on patient and insurance preference. Use up to three times daily as directed. (FOR ICD-9 250.00, 250.01).  . CINNAMON PO Take by mouth.  . ELDERBERRY PO Take by mouth.  . empagliflozin (JARDIANCE) 10 MG TABS tablet Take 10 mg by mouth daily before breakfast.  . fluticasone (FLONASE) 50 MCG/ACT nasal spray Place into both nostrils daily.  . hydrochlorothiazide (HYDRODIURIL) 25 MG tablet TAKE ONE TABLET BY MOUTH DAILY  . IBUPROFEN PO Take by mouth as needed. Knee pain  . Lancets (ONETOUCH DELICA PLUS [id number]) MISC ONE - THREE TIMES A DAY AS DIRECTED  . losartan (COZAAR) 50 MG tablet TAKE ONE TABLET BY MOUTH DAILY  . metFORMIN (GLUCOPHAGE) 500 MG tablet TAKE ONE TABLET BY MOUTH TWICE A DAY WITH A MEAL (Patient taking differently: daily with  breakfast. )  . mometasone (ELOCON) 0.1 % cream Apply 1 application topically daily.  . Multiple Vitamins-Minerals (ZINC PO) Take by mouth.  . norethindrone (NORLYDA) 0.35 MG tablet Take 1 tablet (0.35 mg total) by mouth daily.  . Omega-3 Fatty Acids (FISH OIL) 1000 MG CAPS Take by mouth.  Marland Kitchen omeprazole (PRILOSEC) 20 MG capsule TAKE ONE CAPSULE BY MOUTH DAILY  . OVER THE COUNTER MEDICATION Calcium 662m with vitamin D3  . SALINE NASAL SPRAY NA Place into the nose as needed.  . triamcinolone cream (KENALOG) 0.1 % Apply topically daily.  . TURMERIC CURCUMIN PO Take by mouth.  . valACYclovir (VALTREX) 500 MG tablet TAKE ONE TABLET BY MOUTH DAILY    Review of Systems  Constitutional: Negative for chills, fatigue and fever.  Respiratory: Negative for cough, chest tightness, shortness of breath and wheezing.   Cardiovascular: Negative for chest pain, palpitations and leg swelling.    Objective:  BP 120/82 (BP Location: Left Arm, Patient Position: Sitting, Cuff Size: Large)   Pulse 90   Temp 97.6 F (36.4 C) (Temporal)   Ht '5\' 7"'  (1.702 m)   Wt 204 lb 14.4 oz (92.9 kg)   LMP 10/11/2019 (Exact Date)   BMI 32.09 kg/m   Weight: 204 lb 14.4 oz (92.9 kg)   BP Readings from Last 3 Encounters:  10/15/19 120/82  10/01/19 120/70  07/09/19 108/78   Wt Readings  from Last 3 Encounters:  10/15/19 204 lb 14.4 oz (92.9 kg)  10/01/19 203 lb (92.1 kg)  08/01/19 200 lb 12.8 oz (91.1 kg)    Physical Exam Constitutional:      General: She is not in acute distress.    Appearance: She is well-developed.  Cardiovascular:     Rate and Rhythm: Normal rate and regular rhythm.     Heart sounds: Normal heart sounds. No murmur. No friction rub.  Pulmonary:     Effort: Pulmonary effort is normal. No respiratory distress.     Breath sounds: Normal breath sounds. No wheezing or rales.  Musculoskeletal:     Right lower leg: No edema.     Left lower leg: No edema.  Neurological:     Mental Status: She  is alert and oriented to person, place, and time.  Psychiatric:        Behavior: Behavior normal.     Assessment/Plan  1. Type 2 diabetes mellitus without complication, without long-term current use of insulin (HCC) Significant improvement in A1c with dietary changes and exercising.  Encouraged her to keep up with this we discussed goal for her can be to target an A1c less than 7.  She has multiple areas that she can still improve on including decreasing carbohydrate intake, and she is motivated to keep tighter control.  We will plan to recheck blood work in 3 months time with a follow-up visit in 6 months time. - POC HgB A1c - Hemoglobin A1c; Future - Microalbumin / creatinine urine ratio; Future  2. Hypertension associated with diabetes ([place]) Blood pressure well controlled.  Continue with current medications. - CBC with Differential/Platelet; Future - Comprehensive metabolic panel; Future  3. Lipid screening - Lipid panel; Future    Return in about 3 months (around 01/15/2020) for bloodwork visit only in 3 months; Chronic condition in 6 months.     Micheline Rough, MD

## 2019-10-19 ENCOUNTER — Telehealth: Payer: Self-pay

## 2019-10-19 NOTE — Telephone Encounter (Signed)
Patient would like to discuss results. 

## 2019-10-19 NOTE — Telephone Encounter (Signed)
Call placed to pt. Pt voicemail box full and no answer and no message left. Will send Mychart message to pt to return call to office.

## 2019-10-20 LAB — FECAL OCCULT BLOOD, IMMUNOCHEMICAL: Fecal Occult Bld: NEGATIVE

## 2019-10-24 NOTE — Telephone Encounter (Signed)
Pt called wanting to discuss results. Pt given results as below:    Archie Balboa, [facility]  10/09/2019 4:45 PM EDT    Results were discussed in detail with patient. 12 month recall entered.     Nunzio Cobbs, MD  10/07/2019 8:12 AM EDT    Please contact patient with results of her testing.  Her pap shows positive HR HPV and the cells are normal.  By [facility] guidedlines, her next cervical cancer screening is due in 12 months.  Please place this recall.   Her STD testing is negative for gonorrhea, chlamydia and trichomonas.     Pt has results from PCP on 10/15/19.  Routing to Dr Quincy Simmonds Encounter closed.

## 2019-10-25 ENCOUNTER — Other Ambulatory Visit: Payer: Self-pay | Admitting: Family Medicine

## 2019-10-25 DIAGNOSIS — E119 Type 2 diabetes mellitus without complications: Secondary | ICD-10-CM

## 2019-10-31 ENCOUNTER — Telehealth: Payer: Self-pay | Admitting: Obstetrics and Gynecology

## 2019-10-31 NOTE — Telephone Encounter (Signed)
Spoke with pt. Pt calling for results of ifob. Pt has not seen on Mychart portal. Pt given results and recommendations per Dr Quincy Simmonds as below. Pt agreeable and verbalized understanding.    Hello Freddy,   I hope you are doing well this evening.   Your stool testing did return negative for blood.   I understand you spoke with the nurse today about your positive high risk HPV test result and normal cells on your pap.  Your next cervical cancer screening will be due in 12 months.   Have a good evening!  Josefa Half, MD   Pt also asking about Rx Norlyda refill. Pt states was sent to wrong pharmacy on 10/01/19. Pt advised to call pharmacy at [facility] and have transfer Rx to [facility]. Pt agreeable. Pt to return call with any concerns or problems with Rx. Pt verbalized understanding.   Routing to Dr Quincy Simmonds for review.  Encounter closed.

## 2019-10-31 NOTE — Telephone Encounter (Signed)
Patient calling back for results of ifob testing and for birth control to be sent to [facility] on [address]. Pharmacy number [phone]

## 2019-11-02 ENCOUNTER — Other Ambulatory Visit: Payer: Self-pay

## 2019-11-02 NOTE — Telephone Encounter (Signed)
Patient notified that prescription has been transferred from [facility] to [facility]. Okay to close encounter.

## 2019-11-02 NOTE — Telephone Encounter (Signed)
Patient is calling regarding birth control prescription. Patient stated it was sent to the wrong pharmacy to [facility]. Patient needs the prescription to be sent to [facility] on [place], [place].

## 2019-11-20 ENCOUNTER — Other Ambulatory Visit: Payer: Self-pay | Admitting: Family Medicine

## 2019-11-20 DIAGNOSIS — Z1231 Encounter for screening mammogram for malignant neoplasm of breast: Secondary | ICD-10-CM

## 2019-12-06 NOTE — Progress Notes (Signed)
[facility]   [phone] [phone]) [phone]   Clinic Follow up Note   Patient Care Team: Caren Macadam, MD as PCP - General (Family Medicine) Rutherford Guys, MD as Consulting Physician (Ophthalmology) Yisroel Ramming, Everardo All, MD as Consulting Physician (Obstetrics and Gynecology)  Date of Service:  12/10/2019  CHIEF COMPLAINT: f/u anemia of iron deficiency and chronic disease   CURRENT THERAPY:  Observation   INTERVAL HISTORY:  Linda Conrad is here for a follow up of anemia. She was last seen by our clinic 1 year ago with NP Lacie. She presents to the clinic alone. She notes she is doing well. She denies fatigue or SOB. She notes she sees her PCP next month every 3-6 months to manage her DM. She notes her latest A1c was 7. She notes she has regular menstrual periods which is mild to moderate flow. She notes having recent blood in stool based on stool card test in 09/2019. She notes she has not had a colonoscopy. She had not seen blood in stool herself.    REVIEW OF SYSTEMS:   Constitutional: Denies fevers, chills or abnormal weight loss Eyes: Denies blurriness of vision Ears, nose, mouth, throat, and face: Denies mucositis or sore throat Respiratory: Denies cough, dyspnea or wheezes Cardiovascular: Denies palpitation, chest discomfort or lower extremity swelling Gastrointestinal:  Denies nausea, heartburn or change in bowel habits Skin: Denies abnormal skin rashes Lymphatics: Denies new lymphadenopathy or easy bruising Neurological:Denies numbness, tingling or new weaknesses Behavioral/Psych: Mood is stable, no new changes  All other systems were reviewed with the patient and are negative.  MEDICAL HISTORY:  Past Medical History:  Diagnosis Date   Abnormal Pap smear of cervix - hpv per report in 2014, seeing gyn 11/16/2013   normal pap with gyn 2015   Arthritis    hand   CIN I (cervical intraepithelial neoplasia I) 2020   Diabetes  mellitus without complication ([place])    Family history of bladder cancer    Family history of breast cancer    Family history of kidney cancer    Family history of ovarian cancer    Family history of prostate cancer    Foreign body of hand, right 07/2011   right MP   GERD (gastroesophageal reflux disease)    Headache(784.0)    sinus   HSV-1 (herpes simplex virus 1) infection    Hypertension    denies HTN, but takes Hyzaar    Papanicolaou smear of cervix with positive high risk human papilloma virus (HPV) test 2021   Needs cervical cancer screenig in 2022   Stenosing tenosynovitis of wrist 07/2011   right    SURGICAL HISTORY: Past Surgical History:  Procedure Laterality Date   CYST EXCISION     DORSAL COMPARTMENT RELEASE  08/26/2011   Procedure: RELEASE DORSAL COMPARTMENT (DEQUERVAIN);  Surgeon: Cammie Sickle., MD;  Location: [facility];  Service: Orthopedics;  Laterality: Right;   FOREIGN BODY REMOVAL  08/26/2011   Procedure: FOREIGN BODY REMOVAL ADULT;  Surgeon: Cammie Sickle., MD;  Location: [place];  Service: Orthopedics;  Laterality: Right;    I have reviewed the social history and family history with the patient and they are unchanged from previous note.  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  MEDICATIONS:  Current Outpatient Medications  Medication Sig Dispense Refill   acetaminophen (TYLENOL) 650 MG CR tablet Take 650 mg by mouth every  8 (eight) hours as needed for pain.     blood glucose meter kit and supplies KIT Dispense based on patient and insurance preference. Use up to three times daily as directed. (FOR ICD-9 250.00, 250.01). 1 each 0   CINNAMON PO Take by mouth.     ELDERBERRY PO Take by mouth.     empagliflozin (JARDIANCE) 10 MG TABS tablet Take 10 mg by mouth daily before breakfast. 90 tablet 1   fluticasone (FLONASE) 50 MCG/ACT nasal spray Place into both nostrils daily.       hydrochlorothiazide (HYDRODIURIL) 25 MG tablet TAKE ONE TABLET BY MOUTH DAILY 30 tablet 2   IBUPROFEN PO Take by mouth as needed. Knee pain     Lancets (ONETOUCH DELICA PLUS [id number]) MISC ONE - THREE TIMES A DAY AS DIRECTED 100 each 2   losartan (COZAAR) 50 MG tablet TAKE ONE TABLET BY MOUTH DAILY 30 tablet 2   metFORMIN (GLUCOPHAGE) 500 MG tablet TAKE ONE TABLET BY MOUTH TWICE A DAY WITH MEALS 60 tablet 2   mometasone (ELOCON) 0.1 % cream Apply 1 application topically daily. 45 g 1   Multiple Vitamins-Minerals (ZINC PO) Take by mouth.     norethindrone (NORLYDA) 0.35 MG tablet Take 1 tablet (0.35 mg total) by mouth daily. 3 Package 3   Omega-3 Fatty Acids (FISH OIL) 1000 MG CAPS Take by mouth.     omeprazole (PRILOSEC) 20 MG capsule TAKE ONE CAPSULE BY MOUTH DAILY 30 capsule 3   OVER THE COUNTER MEDICATION Calcium 61m with vitamin D3     SALINE NASAL SPRAY NA Place into the nose as needed.     triamcinolone cream (KENALOG) 0.1 % Apply topically daily. 30 g 1   TURMERIC CURCUMIN PO Take by mouth.     valACYclovir (VALTREX) 500 MG tablet TAKE ONE TABLET BY MOUTH DAILY 30 tablet 1   No current facility-administered medications for this visit.    PHYSICAL EXAMINATION: ECOG PERFORMANCE STATUS: 0 - Asymptomatic  Vitals:   12/10/19 1311  BP: (!) 114/57  Pulse: 85  Resp: 18  Temp: 98.4 F (36.9 C)  SpO2: 100%   Filed Weights   12/10/19 1311  Weight: 202 lb 1.6 oz (91.7 kg)    Due to COVID19 we will limit examination to appearance. Patient had no complaints.  GENERAL:alert, no distress and comfortable SKIN: skin color normal, no rashes or significant lesions EYES: normal, Conjunctiva are pink and non-injected, sclera clear  NEURO: alert & oriented x 3 with fluent speech   LABORATORY DATA:  I have reviewed the data as listed CBC Latest Ref Rng & Units 12/10/2019 02/28/2019 12/11/2018  WBC 4.0 - 10.5 K/uL 5.2 5.2 5.2  Hemoglobin 12.0 - 15.0 g/dL 12.8 13.2 12.9   Hematocrit 36 - 46 % 39.8 40.2 39.2  Platelets 150 - 400 K/uL 328 289.0 198     CMP Latest Ref Rng & Units 02/28/2019 07/20/2018 03/21/2018  Glucose 70 - 99 mg/dL 310(H) 193(H) 73  BUN 6 - 23 mg/dL _0 Creatinine 0.40 - 1.20 mg/dL 0.82 0.84 0.90  Sodium 135 - 145 mEq/L 134(L) 136 135  Potassium 3.5 - 5.1 mEq/L 3.8 4.0 3.6  Chloride 96 - 112 mEq/L 96 100 99  CO2 19 - 32 mEq/L _1 Calcium 8.4 - 10.5 mg/dL 9.7 8.8 10.0  Total Protein 6.0 - 8.3 g/dL 6.8 - -  Total Bilirubin 0.2 - 1.2 mg/dL 0.7 - -  Alkaline Phos 39 - 117  U/L 99 - -  AST 0 - 37 U/L 20 - -  ALT 0 - 35 U/L 19 - -      RADIOGRAPHIC STUDIES: I have personally reviewed the radiological images as listed and agreed with the findings in the report. No results found.   ASSESSMENT & PLAN:  Shariya Gaster is a 44 y.o. female with    1. Normocytic, normochromic anemia  -Initially seen in 01/2018 for chronic mild intermittent normocytic, normochromic anemia, no other cytopenias; no evidence of hemolysis, B12 deficiency, or severe iron deficiency. She reportedly had negative and one positive FOBT in the past. She is going to have colonoscopy.   -Iron studies in 07/2017 were in low-normal range, I had recommend she take multivitamin with iron daily but patient did not start iron supplement -Her mild anemia and iron deficiency are felt to be related to blood loss from her menses; she does not require aggressive iron replacement with IV Feraheme.  -Her mild anemia may also have component of chronic disease related to her DM, although this is controlled  -She is clinically doing well. She denies fatigue, SOB. She is not on oral iron. Labs reviewed, CBC WNL. Iron panel still pending. -She has regular periods with mild to moderate flow. Will continue to check her iron level 2-3 times a year.   -She notes her 09/2019 stool card test was positive. She has not seen blood in stool herself. She will monitor and proceed with  colonoscopy  -given her normal CBC lately, I will see her as needed in future  -F/u open and continue to f/u with PCP   2. Genetic Testing, Cancer Screenings -She has multiple family members with cancer, including ovarian cancer on maternal and paternal lineage and breast cancer on paternal side.  -Her genetic testing was negative for pathogenetic mutations with VUS of MSH3 and RAD51D.  -Her 11/2018 Mammogram was benign, continue yearly (next on 12/20/19). She continues routine gyn f/u. 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    PLAN: -Mammogram on 12/20/19  -F/u open, she will continue to f/u with PCP and CBC and iron study every 6 months   No problem-specific Assessment & Plan notes found for this encounter.   No orders of the defined types were placed in this encounter.  All questions were answered. The patient knows to call the clinic with any problems, questions or concerns. No barriers to learning was detected. The total time spent in the appointment was 20 minutes.     Truitt Merle, MD 12/10/2019   I, Joslyn Devon, am acting as scribe for Truitt Merle, MD.   I have reviewed the above documentation for accuracy and completeness, and I agree with the above.   Addendum  Her ferritin was low at 10 today, I will call her and recommend her to restart oral iron.   Truitt Merle

## 2019-12-07 ENCOUNTER — Other Ambulatory Visit: Payer: Self-pay | Admitting: Family Medicine

## 2019-12-10 ENCOUNTER — Inpatient Hospital Stay: Payer: 59 | Attending: Hematology

## 2019-12-10 ENCOUNTER — Inpatient Hospital Stay: Payer: 59 | Admitting: Hematology

## 2019-12-10 ENCOUNTER — Encounter: Payer: Self-pay | Admitting: Hematology

## 2019-12-10 ENCOUNTER — Other Ambulatory Visit: Payer: Self-pay

## 2019-12-10 VITALS — BP 114/57 | HR 85 | Temp 98.4°F | Resp 18 | Wt 202.1 lb

## 2019-12-10 DIAGNOSIS — Z7984 Long term (current) use of oral hypoglycemic drugs: Secondary | ICD-10-CM | POA: Insufficient documentation

## 2019-12-10 DIAGNOSIS — D509 Iron deficiency anemia, unspecified: Secondary | ICD-10-CM | POA: Diagnosis not present

## 2019-12-10 DIAGNOSIS — Z803 Family history of malignant neoplasm of breast: Secondary | ICD-10-CM | POA: Diagnosis not present

## 2019-12-10 DIAGNOSIS — D649 Anemia, unspecified: Secondary | ICD-10-CM

## 2019-12-10 DIAGNOSIS — Z79899 Other long term (current) drug therapy: Secondary | ICD-10-CM | POA: Insufficient documentation

## 2019-12-10 DIAGNOSIS — Z8041 Family history of malignant neoplasm of ovary: Secondary | ICD-10-CM | POA: Insufficient documentation

## 2019-12-10 DIAGNOSIS — E119 Type 2 diabetes mellitus without complications: Secondary | ICD-10-CM | POA: Insufficient documentation

## 2019-12-10 DIAGNOSIS — I10 Essential (primary) hypertension: Secondary | ICD-10-CM | POA: Insufficient documentation

## 2019-12-10 DIAGNOSIS — K921 Melena: Secondary | ICD-10-CM | POA: Diagnosis not present

## 2019-12-10 LAB — CBC WITH DIFFERENTIAL (CANCER CENTER ONLY)
Abs Immature Granulocytes: 0 10*3/uL (ref 0.00–0.07)
Basophils Absolute: 0 10*3/uL (ref 0.0–0.1)
Basophils Relative: 1 %
Eosinophils Absolute: 0.1 10*3/uL (ref 0.0–0.5)
Eosinophils Relative: 1 %
HCT: 39.8 % (ref 36.0–46.0)
Hemoglobin: 12.8 g/dL (ref 12.0–15.0)
Immature Granulocytes: 0 %
Lymphocytes Relative: 36 %
Lymphs Abs: 1.8 10*3/uL (ref 0.7–4.0)
MCH: 27.6 pg (ref 26.0–34.0)
MCHC: 32.2 g/dL (ref 30.0–36.0)
MCV: 86 fL (ref 80.0–100.0)
Monocytes Absolute: 0.4 10*3/uL (ref 0.1–1.0)
Monocytes Relative: 7 %
Neutro Abs: 2.9 10*3/uL (ref 1.7–7.7)
Neutrophils Relative %: 55 %
Platelet Count: 328 10*3/uL (ref 150–400)
RBC: 4.63 MIL/uL (ref 3.87–5.11)
RDW: 14.7 % (ref 11.5–15.5)
WBC Count: 5.2 10*3/uL (ref 4.0–10.5)
nRBC: 0 % (ref 0.0–0.2)

## 2019-12-10 LAB — IRON AND TIBC
Iron: 50 ug/dL (ref 41–142)
Saturation Ratios: 13 % — ABNORMAL LOW (ref 21–57)
TIBC: 390 ug/dL (ref 236–444)
UIBC: 340 ug/dL (ref 120–384)

## 2019-12-10 LAB — FERRITIN: Ferritin: 10 ng/mL — ABNORMAL LOW (ref 11–307)

## 2019-12-11 ENCOUNTER — Telehealth: Payer: Self-pay | Admitting: Hematology

## 2019-12-11 ENCOUNTER — Encounter: Payer: Self-pay | Admitting: Hematology

## 2019-12-11 NOTE — Telephone Encounter (Signed)
F/u as needed per 7/12 los

## 2019-12-18 ENCOUNTER — Telehealth: Payer: Self-pay | Admitting: *Deleted

## 2019-12-18 NOTE — Telephone Encounter (Signed)
Notified of message below. Verbalized understanding 

## 2019-12-18 NOTE — Telephone Encounter (Signed)
-----   Message from Alla Feeling, NP sent at 12/16/2019  2:52 PM EDT ----- Please let her know Ferritin has dropped to 10, but serum iron and TIBC are normal and CBC is completely WNL. I suggest she go back on oral iron once daily if she can tolerate it, or take daily multivitamin such as prenatal vitamin with iron.   Dr. Burr Medico, you saw her 7/12, let me know if you have other recommendations.   Thanks, Regan Rakers NP

## 2019-12-20 ENCOUNTER — Ambulatory Visit: Payer: 59

## 2019-12-21 ENCOUNTER — Ambulatory Visit: Payer: 59

## 2019-12-26 ENCOUNTER — Ambulatory Visit
Admission: RE | Admit: 2019-12-26 | Discharge: 2019-12-26 | Disposition: A | Discharge status: Home / Self Care | Payer: 59 | Source: Ambulatory Visit | Attending: Family Medicine | Admitting: Family Medicine

## 2019-12-26 DIAGNOSIS — Z1231 Encounter for screening mammogram for malignant neoplasm of breast: Secondary | ICD-10-CM

## 2019-12-29 ENCOUNTER — Other Ambulatory Visit: Payer: Self-pay | Admitting: Family Medicine

## 2020-01-18 ENCOUNTER — Other Ambulatory Visit: Payer: Self-pay | Admitting: Family Medicine

## 2020-01-21 ENCOUNTER — Ambulatory Visit: Payer: 59 | Admitting: Family Medicine

## 2020-02-13 ENCOUNTER — Other Ambulatory Visit: Payer: Self-pay | Admitting: Family Medicine

## 2020-02-29 ENCOUNTER — Encounter: Payer: Self-pay | Admitting: Family Medicine

## 2020-02-29 ENCOUNTER — Other Ambulatory Visit: Payer: Self-pay

## 2020-02-29 ENCOUNTER — Ambulatory Visit (INDEPENDENT_AMBULATORY_CARE_PROVIDER_SITE_OTHER): Payer: 59 | Admitting: Family Medicine

## 2020-02-29 VITALS — BP 110/82 | HR 85 | Temp 98.5°F | Ht 67.0 in | Wt 197.8 lb

## 2020-02-29 DIAGNOSIS — E1159 Type 2 diabetes mellitus with other circulatory complications: Secondary | ICD-10-CM

## 2020-02-29 DIAGNOSIS — K219 Gastro-esophageal reflux disease without esophagitis: Secondary | ICD-10-CM | POA: Diagnosis not present

## 2020-02-29 DIAGNOSIS — E876 Hypokalemia: Secondary | ICD-10-CM

## 2020-02-29 DIAGNOSIS — Z13220 Encounter for screening for lipoid disorders: Secondary | ICD-10-CM | POA: Diagnosis not present

## 2020-02-29 DIAGNOSIS — Z23 Encounter for immunization: Secondary | ICD-10-CM | POA: Diagnosis not present

## 2020-02-29 DIAGNOSIS — E611 Iron deficiency: Secondary | ICD-10-CM

## 2020-02-29 DIAGNOSIS — E1165 Type 2 diabetes mellitus with hyperglycemia: Secondary | ICD-10-CM | POA: Diagnosis not present

## 2020-02-29 DIAGNOSIS — I152 Hypertension secondary to endocrine disorders: Secondary | ICD-10-CM

## 2020-02-29 DIAGNOSIS — K921 Melena: Secondary | ICD-10-CM

## 2020-02-29 NOTE — Progress Notes (Signed)
Linda Conrad DOB: [birth year] Encounter date: 02/29/2020  This is a 44 y.o. female who presents with Chief Complaint  Patient presents with  . Follow-up    History of present illness: She is still doing boot camp. Eating is about the same; just working on cutting out chick fil a due to fries. Found [employer] that is really good; so this has been issues. Eating salads, broccoli, green beans. Occasional ice cream.  Still getting at least 6000 steps today.   Hasn't been checking sugars or pressures at home. She continues to take losartan, hctz for blood presure. No issues with low pressures/sugars.   Taking jardiance $RemoveBeforeDE'10mg'pajGUzbdAvLYwwN$ , metformin $RemoveBef'500mg'JMyrJGLFJu$  for sugars.   Still using prilosec for acid reflux. No sx of acid reflux.   Still using flonase for allergies.   Periods are lighter although blood is darker. Had one episode of noting blood in stool   Allergies  Allergen Reactions  . Oak Bark [Quercus [place] trees  . Soap Rash    Everything except Dove   Current Meds  Medication Sig  . acetaminophen (TYLENOL) 650 MG CR tablet Take 650 mg by mouth every 8 (eight) hours as needed for pain.  . blood glucose meter kit and supplies KIT Dispense based on patient and insurance preference. Use up to three times daily as directed. (FOR ICD-9 250.00, 250.01).  . CINNAMON PO Take by mouth.  . ELDERBERRY PO Take by mouth.  . fluticasone (FLONASE) 50 MCG/ACT nasal spray Place into both nostrils daily.  . hydrochlorothiazide (HYDRODIURIL) 25 MG tablet TAKE ONE TABLET BY MOUTH DAILY  . IBUPROFEN PO Take by mouth as needed. Knee pain  . JARDIANCE 10 MG TABS tablet TAKE ONE TABLET BY MOUTH DAILY BEFORE BREAKFAST  . Lancets (ONETOUCH DELICA PLUS QIONGE95M) MISC ONE - THREE TIMES A DAY AS DIRECTED  . losartan (COZAAR) 50 MG tablet TAKE ONE TABLET BY MOUTH DAILY  . metFORMIN (GLUCOPHAGE) 500 MG tablet TAKE ONE TABLET BY MOUTH TWICE A DAY WITH MEALS  . mometasone (ELOCON) 0.1 % cream Apply 1 application  topically daily.  . Multiple Vitamins-Minerals (ZINC PO) Take by mouth.  . norethindrone (NORLYDA) 0.35 MG tablet Take 1 tablet (0.35 mg total) by mouth daily.  . Omega-3 Fatty Acids (FISH OIL) 1000 MG CAPS Take by mouth.  Marland Kitchen omeprazole (PRILOSEC) 20 MG capsule TAKE ONE CAPSULE BY MOUTH DAILY  . OVER THE COUNTER MEDICATION Beet root  . SALINE NASAL SPRAY NA Place into the nose as needed.  . triamcinolone cream (KENALOG) 0.1 % Apply topically daily.  . TURMERIC CURCUMIN PO Take by mouth.  . valACYclovir (VALTREX) 500 MG tablet TAKE ONE TABLET BY MOUTH DAILY    Review of Systems  Constitutional: Negative for chills, fatigue and fever.  Respiratory: Negative for cough, chest tightness, shortness of breath and wheezing.   Cardiovascular: Negative for chest pain, palpitations and leg swelling.    Objective:  BP 110/82 (BP Location: Left Arm, Patient Position: Sitting, Cuff Size: Large)   Pulse 85   Temp 98.5 F (36.9 C) (Oral)   Ht $R'5\' 7"'Gv$  (1.702 m)   Wt 197 lb 12.8 oz (89.7 kg)   BMI 30.98 kg/m   Weight: 197 lb 12.8 oz (89.7 kg)   BP Readings from Last 3 Encounters:  02/29/20 110/82  12/10/19 (!) 114/57  10/15/19 120/82   Wt Readings from Last 3 Encounters:  02/29/20 197 lb 12.8 oz (89.7 kg)  12/10/19 202 lb 1.6 oz (  91.7 kg)  10/15/19 204 lb 14.4 oz (92.9 kg)    Physical Exam Constitutional:      General: She is not in acute distress.    Appearance: She is well-developed.  HENT:     Head: Normocephalic and atraumatic.  Cardiovascular:     Rate and Rhythm: Normal rate and regular rhythm.     Heart sounds: Normal heart sounds. No murmur heard.   Pulmonary:     Effort: Pulmonary effort is normal.     Breath sounds: Normal breath sounds.  Abdominal:     General: Bowel sounds are normal. There is no distension.     Palpations: Abdomen is soft.     Tenderness: There is no abdominal tenderness. There is no guarding.  Skin:    General: Skin is warm and dry.     Comments:  Sensory exam of the foot is normal, tested with the monofilament. Good pulses, no lesions or ulcers, good peripheral pulses.  Psychiatric:        Judgment: Judgment normal.     Assessment/Plan  1. Hypertension associated with diabetes ([place]) Blood pressures well controlled.  Continue current medication.  Losartan 50 mg daily (also offers renal protection), hydrochlorothiazide 25 mg daily. - CBC with Differential/Platelet; Future - Comprehensive metabolic panel; Future - Comprehensive metabolic panel - CBC with Differential/Platelet  2. Gastroesophageal reflux disease, unspecified whether esophagitis present Controlled with omeprazole.  Continue 20 mg daily.  3. Controlled type 2 diabetes mellitus with hyperglycemia, without long-term current use of insulin ([place]) She has done a great job of changing dietary habits.  Continue with Jardiance 10 mg daily, Metformin 500 mg twice daily.  Keep up with regular exercise.  We will recheck blood work today. - Hemoglobin A1c; Future - Microalbumin / creatinine urine ratio; Future - HM DIABETES FOOT EXAM - Microalbumin / creatinine urine ratio - Hemoglobin A1c  4. Lipid screening Cholesterols been very well controlled previously.  We will recheck levels today. - Lipid panel; Future - Lipid panel  5. Iron deficiency To recheck iron stores. - Iron, TIBC and Ferritin Panel; Future - Iron, TIBC and Ferritin Panel  6. Blood in stool She had noted blood in stool.  Some confusion since patient thought that fecal occult was positive, but it is documented as negative?  I do think with her seeing blood in the stool is worthwhile to send her to GI for further evaluation/initial colonoscopy. - Ambulatory referral to Gastroenterology  7. Need for immunization against influenza - Flu Vaccine QUAD 6+ mos PF IM (Fluarix Quad PF)   Return for pending bloodwork.    Micheline Rough, MD

## 2020-03-01 LAB — COMPREHENSIVE METABOLIC PANEL
AG Ratio: 1.6 (calc) (ref 1.0–2.5)
ALT: 9 U/L (ref 6–29)
AST: 14 U/L (ref 10–35)
Albumin: 4.4 g/dL (ref 3.6–5.1)
Alkaline phosphatase (APISO): 74 U/L (ref 31–125)
BUN: 13 mg/dL (ref 7–25)
CO2: 29 mmol/L (ref 20–32)
Calcium: 9.6 mg/dL (ref 8.6–10.2)
Chloride: 100 mmol/L (ref 98–110)
Creat: 0.83 mg/dL (ref 0.50–1.10)
Globulin: 2.7 g/dL (calc) (ref 1.9–3.7)
Glucose, Bld: 154 mg/dL — ABNORMAL HIGH (ref 65–99)
Potassium: 3 mmol/L — ABNORMAL LOW (ref 3.5–5.3)
Sodium: 138 mmol/L (ref 135–146)
Total Bilirubin: 0.8 mg/dL (ref 0.2–1.2)
Total Protein: 7.1 g/dL (ref 6.1–8.1)

## 2020-03-01 LAB — MICROALBUMIN / CREATININE URINE RATIO
Creatinine, Urine: 137 mg/dL (ref 20–275)
Microalb Creat Ratio: 4 mcg/mg creat (ref <30)
Microalb, Ur: 0.5 mg/dL

## 2020-03-01 LAB — CBC WITH DIFFERENTIAL/PLATELET
Absolute Monocytes: 355 cells/uL (ref 200–950)
Basophils Absolute: 38 cells/uL (ref 0–200)
Basophils Relative: 0.8 %
Eosinophils Absolute: 38 cells/uL (ref 15–500)
Eosinophils Relative: 0.8 %
HCT: 40.2 % (ref 35.0–45.0)
Hemoglobin: 12.9 g/dL (ref 11.7–15.5)
Lymphs Abs: 1814 cells/uL (ref 850–3900)
MCH: 28.1 pg (ref 27.0–33.0)
MCHC: 32.1 g/dL (ref 32.0–36.0)
MCV: 87.6 fL (ref 80.0–100.0)
MPV: 10.4 fL (ref 7.5–12.5)
Monocytes Relative: 7.4 %
Neutro Abs: 2554 cells/uL (ref 1500–7800)
Neutrophils Relative %: 53.2 %
Platelets: 332 10*3/uL (ref 140–400)
RBC: 4.59 10*6/uL (ref 3.80–5.10)
RDW: 12.8 % (ref 11.0–15.0)
Total Lymphocyte: 37.8 %
WBC: 4.8 10*3/uL (ref 3.8–10.8)

## 2020-03-01 LAB — HEMOGLOBIN A1C
Hgb A1c MFr Bld: 6.3 % of total Hgb — ABNORMAL HIGH (ref <5.7)
Mean Plasma Glucose: 134 (calc)
eAG (mmol/L): 7.4 (calc)

## 2020-03-01 LAB — LIPID PANEL
Cholesterol: 164 mg/dL (ref <200)
HDL: 57 mg/dL (ref >=50)
LDL Cholesterol (Calc): 87 mg/dL (calc)
Non-HDL Cholesterol (Calc): 107 mg/dL (calc) (ref <130)
Total CHOL/HDL Ratio: 2.9 (calc) (ref <5.0)
Triglycerides: 106 mg/dL (ref <150)

## 2020-03-01 LAB — IRON,TIBC AND FERRITIN PANEL
%SAT: 27 % (calc) (ref 16–45)
Ferritin: 18 ng/mL (ref 16–232)
Iron: 105 ug/dL (ref 40–190)
TIBC: 396 mcg/dL (calc) (ref 250–450)

## 2020-03-03 MED ORDER — POTASSIUM CHLORIDE ER 10 MEQ PO TBCR: EXTENDED_RELEASE_TABLET | ORAL | 0 refills | Status: DC

## 2020-03-03 NOTE — Addendum Note (Signed)
Addended by: Agnes Lawrence on: 03/03/2020 03:59 PM   Modules accepted: Orders

## 2020-03-21 ENCOUNTER — Other Ambulatory Visit: Payer: Self-pay | Admitting: Family Medicine

## 2020-03-27 ENCOUNTER — Other Ambulatory Visit: Payer: Self-pay | Admitting: Family Medicine

## 2020-03-27 DIAGNOSIS — E119 Type 2 diabetes mellitus without complications: Secondary | ICD-10-CM

## 2020-04-03 ENCOUNTER — Other Ambulatory Visit: Payer: Self-pay | Admitting: Family Medicine

## 2020-04-04 ENCOUNTER — Other Ambulatory Visit: Payer: 59

## 2020-04-07 ENCOUNTER — Other Ambulatory Visit: Payer: Self-pay

## 2020-04-07 ENCOUNTER — Other Ambulatory Visit: Payer: 59

## 2020-04-07 DIAGNOSIS — E876 Hypokalemia: Secondary | ICD-10-CM

## 2020-04-08 LAB — BASIC METABOLIC PANEL
BUN: 13 mg/dL (ref 7–25)
CO2: 26 mmol/L (ref 20–32)
Calcium: 9.7 mg/dL (ref 8.6–10.2)
Chloride: 100 mmol/L (ref 98–110)
Creat: 0.92 mg/dL (ref 0.50–1.10)
Glucose, Bld: 187 mg/dL — ABNORMAL HIGH (ref 65–99)
Potassium: 4.1 mmol/L (ref 3.5–5.3)
Sodium: 137 mmol/L (ref 135–146)

## 2020-04-28 ENCOUNTER — Ambulatory Visit: Payer: 59 | Attending: Internal Medicine

## 2020-04-28 DIAGNOSIS — Z23 Encounter for immunization: Secondary | ICD-10-CM

## 2020-04-28 NOTE — Progress Notes (Signed)
   Covid-19 Vaccination Clinic  Name:  Linda Conrad    MRN: [id number] DOB: 01/13/1976  04/28/2020  [name] was observed post Covid-19 immunization for 15 minutes without incident. She was provided with Vaccine Information Sheet and instruction to access the V-Safe system.   [name] was instructed to call 911 with any severe reactions post vaccine: Marland Kitchen Difficulty breathing  . Swelling of face and throat  . A fast heartbeat  . A bad rash all over body  . Dizziness and weakness   Immunizations Administered    No immunizations on file.

## 2020-05-02 ENCOUNTER — Encounter: Payer: Self-pay | Admitting: Gastroenterology

## 2020-05-02 ENCOUNTER — Ambulatory Visit: Payer: 59 | Admitting: Gastroenterology

## 2020-05-02 VITALS — BP 110/80 | HR 100 | Ht 66.0 in | Wt 201.0 lb

## 2020-05-02 DIAGNOSIS — K219 Gastro-esophageal reflux disease without esophagitis: Secondary | ICD-10-CM | POA: Diagnosis not present

## 2020-05-02 DIAGNOSIS — R195 Other fecal abnormalities: Secondary | ICD-10-CM

## 2020-05-02 DIAGNOSIS — D509 Iron deficiency anemia, unspecified: Secondary | ICD-10-CM

## 2020-05-02 MED ORDER — NA SULFATE-K SULFATE-MG SULF 17.5-3.13-1.6 GM/177ML PO SOLN: 1.0000 | Freq: Once | ORAL | 0 refills | Status: AC

## 2020-05-02 NOTE — Patient Instructions (Signed)
You have been given a low gas diet.   You can take over the counter Gas-x three times a day as needed for gas and bloating.   You have been scheduled for an endoscopy and colonoscopy. Please follow the written instructions given to you at your visit today. Please pick up your prep supplies at the pharmacy within the next 1-3 days. If you use inhalers (even only as needed), please bring them with you on the day of your procedure.   Thank you for choosing me and [facility].  Pricilla Riffle. Dagoberto Ligas., MD., Marval Regal

## 2020-05-02 NOTE — Progress Notes (Signed)
History of Present Illness: This is a 44 year old female referred by Caren Macadam, MD for further evaluation of iron deficiency anemia, GERD.  She relates heme positive stool was also noted. There are several hemoccult negative stool reports in Epic over the past couple years.  She has been followed by Hematology for management of her anemia for the past couple years.  Over the past 2 years her ferritin has ranged from 10 to 37.  Most recently it was 10 in July. She has chronic GERD that is well controlled on omeprazole qd.  She has a family history of breast, bladder, kidney, ovarian and prostate cancer.  However there is no family history of colon cancer or colon polyps. Denies weight loss, abdominal pain, constipation, diarrhea, change in stool caliber, melena, nausea, vomiting, dysphagia, chest pain.   Allergies  Allergen Reactions  . Oak Bark [[place] trees  . Soap Rash    Everything except Dove   Outpatient Medications Prior to Visit  Medication Sig Dispense Refill  . acetaminophen (TYLENOL) 650 MG CR tablet Take 650 mg by mouth every 8 (eight) hours as needed for pain.    . blood glucose meter kit and supplies KIT Dispense based on patient and insurance preference. Use up to three times daily as directed. (FOR ICD-9 250.00, 250.01). 1 each 0  . CINNAMON PO Take by mouth.    . ELDERBERRY PO Take by mouth.    . ferrous sulfate 325 (65 FE) MG EC tablet Take 325 mg by mouth daily.    . fluticasone (FLONASE) 50 MCG/ACT nasal spray Place into both nostrils daily.    . hydrochlorothiazide (HYDRODIURIL) 25 MG tablet TAKE ONE TABLET BY MOUTH DAILY 30 tablet 2  . IBUPROFEN PO Take by mouth as needed. Knee pain    . JARDIANCE 10 MG TABS tablet TAKE ONE TABLET BY MOUTH DAILY BEFORE BREAKFAST 30 tablet 1  . Lancets (ONETOUCH DELICA PLUS [id number]) MISC USE AS DIRECTED TO TEST BLOOD SUGAR 1-3 TIMES PER DAY 100 each 2  . losartan (COZAAR) 50 MG tablet TAKE ONE TABLET BY  MOUTH DAILY 30 tablet 2  . metFORMIN (GLUCOPHAGE) 500 MG tablet TAKE ONE TABLET BY MOUTH TWICE A DAY WITH MEALS 60 tablet 2  . mometasone (ELOCON) 0.1 % cream Apply 1 application topically daily. 45 g 1  . Multiple Vitamins-Minerals (ZINC PO) Take by mouth.    . norethindrone (NORLYDA) 0.35 MG tablet Take 1 tablet (0.35 mg total) by mouth daily. 3 Package 3  . omeprazole (PRILOSEC) 20 MG capsule TAKE ONE CAPSULE BY MOUTH DAILY 30 capsule 3  . OVER THE COUNTER MEDICATION Beet root    . potassium chloride (KLOR-CON) 10 MEQ tablet Take 2 tablets by mouth daily for 3 days, then 1 tablet daily thereafter 90 tablet 0  . SALINE NASAL SPRAY NA Place into the nose as needed.    . triamcinolone cream (KENALOG) 0.1 % Apply topically daily. 30 g 1  . TURMERIC CURCUMIN PO Take by mouth.    . valACYclovir (VALTREX) 500 MG tablet TAKE ONE TABLET BY MOUTH DAILY 30 tablet 1  . zinc gluconate 50 MG tablet Take 50 mg by mouth daily.    . Omega-3 Fatty Acids (FISH OIL) 1000 MG CAPS Take by mouth.     No facility-administered medications prior to visit.   Past Medical History:  Diagnosis Date  . Abnormal Pap smear of cervix - hpv per report in  2014, seeing gyn 11/16/2013   normal pap with gyn 2015  . Arthritis    hand  . CIN I (cervical intraepithelial neoplasia I) 2020  . Diabetes mellitus without complication ([place])   . Family history of bladder cancer   . Family history of breast cancer   . Family history of kidney cancer   . Family history of ovarian cancer   . Family history of prostate cancer   . Foreign body of hand, right 07/2011   right MP  . GERD (gastroesophageal reflux disease)   . Headache(784.0)    sinus  . HSV-1 (herpes simplex virus 1) infection   . Hypertension    denies HTN, but takes Hyzaar   . Papanicolaou smear of cervix with positive high risk human papilloma virus (HPV) test 2021   Needs cervical cancer screenig in 2022  . Stenosing tenosynovitis of wrist 07/2011   right    Past Surgical History:  Procedure Laterality Date  . CYST EXCISION    . DORSAL COMPARTMENT RELEASE  08/26/2011   Procedure: RELEASE DORSAL COMPARTMENT (DEQUERVAIN);  Surgeon: Cammie Sickle., MD;  Location: [facility];  Service: Orthopedics;  Laterality: Right;  . FOREIGN BODY REMOVAL  08/26/2011   Procedure: FOREIGN BODY REMOVAL ADULT;  Surgeon: Cammie Sickle., MD;  Location: [facility];  Service: Orthopedics;  Laterality: Right;   Social History   Socioeconomic History  . Marital status: Single    Spouse name: Not on file  . Number of children: 0  . Years of education: Not on file  . Highest education level: Not on file  Occupational History  . Not on file  Tobacco Use  . Smoking status: Never Smoker  . Smokeless tobacco: Never Used  Vaping Use  . Vaping Use: Never used  Substance and Sexual Activity  . Alcohol use: Yes    Alcohol/week: 0.0 standard drinks    Comment: occasionally  . Drug use: No  . Sexual activity: Yes    Birth control/protection: Pill    Comment: Micronor  Other Topics Concern  . Not on file  Social History Narrative   Work or School: works with senior center      Home Situation: lives alone      Spiritual Beliefs: none, Christian      Lifestyle: active at work but not otherwise; avoiding carbs            Social Determinants of Radio broadcast assistant Strain:   . Difficulty of Paying Living Expenses: Not on file  Food Insecurity:   . Worried About Charity fundraiser in the Last Year: Not on file  . Ran Out of Food in the Last Year: Not on file  Transportation Needs:   . Lack of Transportation (Medical): Not on file  . Lack of Transportation (Non-Medical): Not on file  Physical Activity:   . Days of Exercise per Week: Not on file  . Minutes of Exercise per Session: Not on file  Stress:   . Feeling of Stress : Not on file  Social Connections:   . Frequency of Communication with Friends and  Family: Not on file  . Frequency of Social Gatherings with Friends and Family: Not on file  . Attends Religious Services: Not on file  . Active Member of Clubs or Organizations: Not on file  . Attends Archivist Meetings: Not on file  . Marital Status: Not on file   Family  History  Problem Relation Age of Onset  . Hyperlipidemia Mother   . Hypertension Mother   . Hypertension Father   . Stroke Father   . Breast cancer Paternal Grandmother        Age 12s or 78s  . Diabetes Mellitus II Paternal Grandmother   . Dementia Paternal Grandmother   . Stroke Paternal Grandfather   . Hypertension Sister   . Diabetes Sister   . Arthritis Maternal Grandmother   . Heart failure Maternal Grandfather   . Diabetes Maternal Grandfather   . Cancer Paternal Aunt        bladder or kidney cancer, Age 67/58   . Colon cancer Other        maternal great-aunt, in her 10s  . Ovarian cancer Cousin 3       maternal uncle's daughter  . Ovarian cancer Other        matrenal great-aunt, in her 40s    Review of Systems: Pertinent positive and negative review of systems were noted in the above HPI section. All other review of systems were otherwise negative.   Physical Exam: General: Well developed, well nourished, no acute distress Head: Normocephalic and atraumatic Eyes:  sclerae anicteric, EOMI Ears: Normal auditory acuity Mouth: Not examined, mask on during Covid-19 pandemic Neck: Supple, no masses or thyromegaly Lungs: Clear throughout to auscultation Heart: Regular rate and rhythm; no murmurs, rubs or bruits Abdomen: Soft, non tender and non distended. No masses, hepatosplenomegaly or hernias noted. Normal Bowel sounds Rectal: Deferred to colonoscopy Musculoskeletal: Symmetrical with no gross deformities  Skin: No lesions on visible extremities Pulses:  Normal pulses noted Extremities: No clubbing, cyanosis, edema or deformities noted Neurological: Alert oriented x 4, grossly  nonfocal Cervical Nodes:  No significant cervical adenopathy Inguinal Nodes: No significant inguinal adenopathy Psychological:  Alert and cooperative. Normal mood and affect   Assessment and Recommendations:  1.  Iron deficiency anemia.  Likely related to menses.  Heme positive stool reported. Rule out gastrointestinal sources of blood loss. Schedule colonoscopy and EGD. The risks (including bleeding, perforation, infection, missed lesions, medication reactions and possible hospitalization or surgery if complications occur), benefits, and alternatives to colonoscopy with possible biopsy and possible polypectomy were discussed with the patient and they consent to proceed. The risks (including bleeding, perforation, infection, missed lesions, medication reactions and possible hospitalization or surgery if complications occur), benefits, and alternatives to endoscopy with possible biopsy and possible dilation were discussed with the patient and they consent to proceed.   2.  GERD. Antireflux measures. Omeprazole 20 mg po qam. EGD as above.   cc: Caren Macadam, MD [address] [place],   [id number]

## 2020-05-14 ENCOUNTER — Telehealth: Payer: Self-pay

## 2020-05-14 NOTE — Telephone Encounter (Signed)
Patient is still bleeding. She is not scheduled for colonoscopy until January.

## 2020-05-14 NOTE — Progress Notes (Signed)
GYNECOLOGY  VISIT   HPI: 44 y.o.   Single  African American  female   [place] with Patient's last menstrual period was 04/15/2020 (exact date).   here for vaginal bleeding since 05-01-20. Patient states having right low back pain as well. Bright red heavy bleeding today. Bleeding had been dark in color until today.  Having some cramping and lower buttock pain.  She feels a catch when she gets up quickly.  No pain medication use.  Has Tylenol available.   Her cycles were regular and monthly prior to this.  She had a prolonged menstraution in March.  She is taking POPs.   Missed a pill in November, none this month.   LNMP 04-15-20   No new partners.   Has colon/EGD scheduled 06-20-20.  GYNECOLOGIC HISTORY: Patient's last menstrual period was 04/15/2020 (exact date). Contraception:  Micronor Menopausal hormone therapy:  n/a Last mammogram: 12-26-19 3D/Neg/density C/BiRads1 Last pap smear: 10-01-19 Neg:Pos HR HPV, 11/28/18 Colpo showed low grade dysplasia of cervix:ECC benign; 10/31/18 Neg:Pos HR HPV, Pos Subtype 16;7/11/17Neg:NegHR HPV        OB History    Gravida  0   Para  0   Term  0   Preterm  0   AB  0   Living  0     SAB  0   IAB  0   Ectopic  0   Multiple  0   Live Births  0              Patient Active Problem List   Diagnosis Date Noted  . Genetic testing 02/27/2019  . Family history of ovarian cancer   . Family history of breast cancer   . Family history of prostate cancer   . Family history of bladder cancer   . Family history of kidney cancer   . Herpes 10/25/2018  . Anemia 08/07/2017  . Hx of abnormal cervical Pap smear 08/01/2015  . Diabetes mellitus ([place]) 09/13/2014  . Hypertension associated with diabetes ([place]) 11/16/2013  . GERD (gastroesophageal reflux disease) 11/16/2013  . Hearing loss - followed by [facility] 11/16/2013    Past Medical History:  Diagnosis Date  . Abnormal Pap smear of cervix - hpv per report in 2014,  seeing gyn 11/16/2013   normal pap with gyn 2015  . Arthritis    hand  . CIN I (cervical intraepithelial neoplasia I) 2020  . Diabetes mellitus without complication ([place])   . Family history of bladder cancer   . Family history of breast cancer   . Family history of kidney cancer   . Family history of ovarian cancer   . Family history of prostate cancer   . Foreign body of hand, right 07/2011   right MP  . GERD (gastroesophageal reflux disease)   . Headache(784.0)    sinus  . HSV-1 (herpes simplex virus 1) infection   . Hypertension    denies HTN, but takes Hyzaar   . Papanicolaou smear of cervix with positive high risk human papilloma virus (HPV) test 2021   Needs cervical cancer screenig in 2022  . Stenosing tenosynovitis of wrist 07/2011   right    Past Surgical History:  Procedure Laterality Date  . CYST EXCISION    . DORSAL COMPARTMENT RELEASE  08/26/2011   Procedure: RELEASE DORSAL COMPARTMENT (DEQUERVAIN);  Surgeon: Cammie Sickle., MD;  Location: [facility];  Service: Orthopedics;  Laterality: Right;  . FOREIGN BODY REMOVAL  08/26/2011  Procedure: FOREIGN BODY REMOVAL ADULT;  Surgeon: Cammie Sickle., MD;  Location: [facility];  Service: Orthopedics;  Laterality: Right;    Current Outpatient Medications  Medication Sig Dispense Refill  . acetaminophen (TYLENOL) 650 MG CR tablet Take 650 mg by mouth every 8 (eight) hours as needed for pain.    . blood glucose meter kit and supplies KIT Dispense based on patient and insurance preference. Use up to three times daily as directed. (FOR ICD-9 250.00, 250.01). 1 each 0  . CINNAMON PO Take by mouth.    . ELDERBERRY PO Take by mouth.    . ferrous sulfate 325 (65 FE) MG EC tablet Take 325 mg by mouth daily.    . fluticasone (FLONASE) 50 MCG/ACT nasal spray Place into both nostrils daily.    . hydrochlorothiazide (HYDRODIURIL) 25 MG tablet TAKE ONE TABLET BY MOUTH DAILY 30 tablet 2  .  IBUPROFEN PO Take by mouth as needed. Knee pain    . JARDIANCE 10 MG TABS tablet TAKE ONE TABLET BY MOUTH DAILY BEFORE BREAKFAST 30 tablet 1  . Lancets (ONETOUCH DELICA PLUS [id number]) MISC USE AS DIRECTED TO TEST BLOOD SUGAR 1-3 TIMES PER DAY 100 each 2  . losartan (COZAAR) 50 MG tablet TAKE ONE TABLET BY MOUTH DAILY 30 tablet 2  . metFORMIN (GLUCOPHAGE) 500 MG tablet TAKE ONE TABLET BY MOUTH TWICE A DAY WITH MEALS 60 tablet 2  . mometasone (ELOCON) 0.1 % cream Apply 1 application topically daily. 45 g 1  . Multiple Vitamins-Minerals (ZINC PO) Take by mouth.    . norethindrone (NORLYDA) 0.35 MG tablet Take 1 tablet (0.35 mg total) by mouth daily. 3 Package 3  . omeprazole (PRILOSEC) 20 MG capsule TAKE ONE CAPSULE BY MOUTH DAILY 30 capsule 3  . OVER THE COUNTER MEDICATION Beet root    . potassium chloride (KLOR-CON) 10 MEQ tablet Take 2 tablets by mouth daily for 3 days, then 1 tablet daily thereafter 90 tablet 0  . SALINE NASAL SPRAY NA Place into the nose as needed.    [place] BOWEL PREP KIT 17.5-3.13-1.6 GM/177ML SOLN Take by mouth.    . triamcinolone cream (KENALOG) 0.1 % Apply topically daily. 30 g 1  . TURMERIC CURCUMIN PO Take by mouth.    . valACYclovir (VALTREX) 500 MG tablet TAKE ONE TABLET BY MOUTH DAILY 30 tablet 1  . zinc gluconate 50 MG tablet Take 50 mg by mouth daily.     No current facility-administered medications for this visit.     ALLERGIES: Oak bark [quercus [place]] and Soap  Family History  Problem Relation Age of Onset  . Hyperlipidemia Mother   . Hypertension Mother   . Hypertension Father   . Stroke Father   . Breast cancer Paternal Grandmother        Age 61s or 23s  . Diabetes Mellitus II Paternal Grandmother   . Dementia Paternal Grandmother   . Stroke Paternal Grandfather   . Hypertension Sister   . Diabetes Sister   . Arthritis Maternal Grandmother   . Heart failure Maternal Grandfather   . Diabetes Maternal Grandfather   . Cancer Paternal Aunt         bladder or kidney cancer, Age 40/58   . Colon cancer Other        maternal great-aunt, in her 40s  . Ovarian cancer Cousin 4       maternal uncle's daughter  . Ovarian cancer Other  matrenal great-aunt, in her 31s    Social History   Socioeconomic History  . Marital status: Single    Spouse name: Not on file  . Number of children: 0  . Years of education: Not on file  . Highest education level: Not on file  Occupational History  . Not on file  Tobacco Use  . Smoking status: Never Smoker  . Smokeless tobacco: Never Used  Vaping Use  . Vaping Use: Never used  Substance and Sexual Activity  . Alcohol use: Yes    Alcohol/week: 0.0 standard drinks    Comment: occasionally  . Drug use: No  . Sexual activity: Yes    Birth control/protection: Pill    Comment: Micronor  Other Topics Concern  . Not on file  Social History Narrative   Work or School: works with senior center      Home Situation: lives alone      Spiritual Beliefs: none, Christian      Lifestyle: active at work but not otherwise; avoiding carbs            Social Determinants of Radio broadcast assistant Strain: Not on file  Food Insecurity: Not on file  Transportation Needs: Not on file  Physical Activity: Not on file  Stress: Not on file  Social Connections: Not on file  Intimate Partner Violence: Not on file    Review of Systems  All other systems reviewed and are negative.   PHYSICAL EXAMINATION:    BP 108/70 (Cuff Size: Large)   Pulse 94   Ht '5\' 6"'  (1.676 m)   Wt 197 lb (89.4 kg)   LMP 04/15/2020 (Exact Date)   SpO2 98%   BMI 31.80 kg/m     General appearance: alert, cooperative and appears stated age   Abdomen: soft, tender right lower quadrant with palpation and no guarding or rebound, no masses,  no organomegaly   Pelvic: External genitalia:  no lesions              Urethra:  normal appearing urethra with no masses, tenderness or lesions              Bartholins and  Skenes: normal                 Vagina: normal appearing vagina with normal color and discharge, no lesions              Cervix: no lesions                Bimanual Exam:  Uterus:  normal size, contour, position, consistency, mobility, non-tender              Adnexa: no mass, fullness, tenderness on left.  Tender adnexa on right and no mass palpated.               Rectal exam:  confirms above.               Chaperone was present for exam.  ASSESSMENT  Abnormal uterine bleeding with prolonged menses.  On POP. Right lower quadrant pain.  Ovarian cyst? HTN.   PLAN  UPT - negative.  Will proceed with pelvic ultrasound.  Ok to continue with progesterone birth control pills.  To the ER for increasing pain.    21 min  total time was spent for this patient encounter, including preparation, face-to-face counseling with the patient, coordination of care, and documentation of the encounter.

## 2020-05-14 NOTE — Telephone Encounter (Signed)
AEX 09/2019 Hx positive LGSIL and positive HR HPV.  Hx HSV 1 DM Heme + stool by PCP Iron Def Anemia by PCP GERD  Spoke with pt. Pt states having vaginal vs rectal bleeding. Pt started LMP 12/2 and still on. Pt states having right lower back pain as well with this cycle start. Has long cycles in past.  Denies any UTI sx, fever, chills.  Pt changing pad twice a day and noted blood is dark in color, but not heavy, soaking or any clots. Pt states is having mild, dull abd cramps and she is not taking any OTC medications for discomfort. Denies feeling lightheaded, dizzy or weak.   Pt states had BM yesterday that was light brown in color, no constipation and was soft. Pt states possibly had rectal bleeding as well. "hard to tell with cycle on" pt states has colonoscopy scheduled with Dr Fuller Plan on 06/20/20.   Pt advised to have OV for further evaluation. Pt agreeable. Pt scheduled with Dr Quincy Simmonds on 12/16 at 2 pm. Declines earlier offered appts due to work schedule. Pt advised ER precautions for heavy bleeding, severe abd pain. Pt agreeable and verbalized understanding.  Routing to Dr Quincy Simmonds for review Encounter closed

## 2020-05-15 ENCOUNTER — Encounter: Payer: Self-pay | Admitting: Obstetrics and Gynecology

## 2020-05-15 ENCOUNTER — Ambulatory Visit: Payer: 59 | Admitting: Obstetrics and Gynecology

## 2020-05-15 ENCOUNTER — Other Ambulatory Visit: Payer: Self-pay

## 2020-05-15 VITALS — BP 108/70 | HR 94 | Ht 66.0 in | Wt 197.0 lb

## 2020-05-15 DIAGNOSIS — R102 Pelvic and perineal pain: Secondary | ICD-10-CM | POA: Diagnosis not present

## 2020-05-15 DIAGNOSIS — N939 Abnormal uterine and vaginal bleeding, unspecified: Secondary | ICD-10-CM

## 2020-05-15 LAB — POCT URINE PREGNANCY: Preg Test, Ur: NEGATIVE

## 2020-05-15 NOTE — Progress Notes (Signed)
Call placed to [facility], patient can be seen for PUS today at 3:45 for work in appt, patient declines.   Call placed to [facility] Radiology Scheduling, spoke with Verdis Frederickson, first available STAT Ultrasound is at [facility] on 12/17. Patient request to proceed with 12/17 PUS appt, patient scheduled for work in on 12/17 at 1pm, arrive at 12:45pm. Arrive with full bladder, drink 32 oz of water 1 hour prior to appt. ER precautions reviewed with patient for new or worsening symptoms, patient verbalizes understanding and is agreeable.

## 2020-05-16 ENCOUNTER — Ambulatory Visit (HOSPITAL_COMMUNITY)
Admission: RE | Admit: 2020-05-16 | Discharge: 2020-05-16 | Disposition: A | Discharge status: Home / Self Care | Payer: 59 | Source: Ambulatory Visit | Attending: Obstetrics and Gynecology | Admitting: Obstetrics and Gynecology

## 2020-05-16 ENCOUNTER — Telehealth: Payer: Self-pay | Admitting: *Deleted

## 2020-05-16 DIAGNOSIS — N939 Abnormal uterine and vaginal bleeding, unspecified: Secondary | ICD-10-CM | POA: Insufficient documentation

## 2020-05-16 DIAGNOSIS — R102 Pelvic and perineal pain: Secondary | ICD-10-CM | POA: Diagnosis not present

## 2020-05-16 NOTE — Telephone Encounter (Signed)
Call to patients mobile number, no answer. Voicemail full, unable to leave message. No alternative number on file, mobile and home are the same.   Routing to triage to f/u with patient on 05/19/20.

## 2020-05-16 NOTE — Telephone Encounter (Signed)
Burnice Logan, RN  05/16/2020 4:39 PM EST Back to Top     Call to patient, no answer. Voicemail full, unable to leave message.

## 2020-05-16 NOTE — Telephone Encounter (Signed)
Ritu from [facility] Radiology called to advise PUS report complete and in Epic. Patient has left.     IMPRESSION: Question small subserosal leiomyoma at posterior upper uterus 2.6 cm greatest size.  Normal appearing endometrial complex and RIGHT ovary with nonvisualization of LEFT ovary.  Small amount of nonspecific endocervical fluid.    Report to Dr. Quincy Simmonds to review.

## 2020-05-16 NOTE — Telephone Encounter (Signed)
-----   Message from Nunzio Cobbs, MD sent at 05/16/2020  4:29 PM EST ----- Please contact patient with results of her pelvic US.  She has a possible 2.6 cm fibroid at the top back part of her uterus.  This is small and sits more on the outside portion of her uterus so, I would no expect it to be a cause for bleeding and pain.  Her right ovary is normal.  Her left ovary was not seen.   I recommend she try a course of Ibuprofen 800 mg by mouth every 8 hours for her bleeding and pain.   If the pain or bleeding increases during the weekend, she can go to the ER for further evaluation.   If her symptoms do not improve, please have her return to the office.

## 2020-05-19 ENCOUNTER — Telehealth: Payer: Self-pay

## 2020-05-19 NOTE — Telephone Encounter (Signed)
Call to patient. Message given to patient as seen below from Dr. Quincy Simmonds. Patient states that the pain and bleeding stopped on Friday. Aware to return call to office if pain and bleeding resumes. Patient agreeable.   Routing to provider and will close encounter.

## 2020-05-19 NOTE — Telephone Encounter (Signed)
-----   Message from Nunzio Cobbs, MD sent at 05/16/2020  4:29 PM EST ----- Please contact patient with results of her pelvic US.  She has a possible 2.6 cm fibroid at the top back part of her uterus.  This is small and sits more on the outside portion of her uterus so, I would no expect it to be a cause for bleeding and pain.  Her right ovary is normal.  Her left ovary was not seen.   I recommend she try a course of Ibuprofen 800 mg by mouth every 8 hours for her bleeding and pain.   If the pain or bleeding increases during the weekend, she can go to the ER for further evaluation.   If her symptoms do not improve, please have her return to the office.

## 2020-05-19 NOTE — Telephone Encounter (Signed)
Attempted to call pt with results. No answer and voicemail box full. Will send mychart message to pt.

## 2020-05-20 ENCOUNTER — Other Ambulatory Visit: Payer: Self-pay | Admitting: Family Medicine

## 2020-05-20 NOTE — Telephone Encounter (Signed)
Spoke with pt. Pt given results and recommendations per Dr Quincy Simmonds. Pt states bleeding and pain stopped on 12/18. Pt states started having back pain again with yellow vaginal discharge with odor yesterday 12/20. Denies any vaginal bleeding today. Pt states will start taking Ibuprofen today to help with dull back pain. Pt states did have some dizziness and lightheaded intermittently while having long bleeding cycle from 12/2-12/18, Denies today having these sx. Pt states has hx of anemia and would like blood work done.  Pt requesting to be seen for follow up and blood work. Pt advised to have OV for evaluation on vaginal discharge as well. Pt agreeable. Pt scheduled as work-in on 12/22 at 415 pm as no other appts available at this time. Pt verbalized understanding. Pt advised will review date and time with Dr Quincy Simmonds and return call with any additional recommendations or change of OV. Pt verbalized understanding.   Routing to Dr Quincy Simmonds.   [place] for OV as scheduled?

## 2020-05-20 NOTE — Telephone Encounter (Signed)
Would it be possible for 11:15 am instead? I want to be sure she gets her blood work done.

## 2020-05-20 NOTE — Progress Notes (Signed)
GYNECOLOGY  VISIT   HPI: 44 y.o.   Single  African American  female   Strawn with Patient's last menstrual period was 05/01/2020 (exact date).   here for follow up to discuss labs and PUS.  Patient seen 05/15/20 with abnormal bleeding and right adnexal pain.  Bleeding did stop. She is on Micronor.  Pelvic US showed a potential small posterior fibroid.   Patient still with right lower back and pelvic pain.  She felt like she was going to pass out last night due to her pain.  Dizziness.  Pain to get in and out of car and lift her right leg.   Tried heat and Advil 600 mg.  Hx renal stones.   No fever, nausea or vomiting.  No diarrhea.  Appetite in decreased with her pain.   She has a colonoscopy and EGD scheduled for January.  She had some blood in the stool that prompted this.   GYNECOLOGIC HISTORY: Patient's last menstrual period was 05/01/2020 (exact date). Contraception:  Micronor Menopausal hormone therapy:  n/a Last mammogram: 12-26-19 3D/Neg/density C/BiRads1 Last pap smear: 10-01-19 Neg:Pos HR HPV, 11/28/18 Colpo showed low grade dysplasia of cervix:ECC benign;10/31/18 Neg:Pos HR HPV, Pos Subtype 16;7/11/17Neg:NegHR HPV        OB History    Gravida  0   Para  0   Term  0   Preterm  0   AB  0   Living  0     SAB  0   IAB  0   Ectopic  0   Multiple  0   Live Births  0              Patient Active Problem List   Diagnosis Date Noted  . Genetic testing 02/27/2019  . Family history of ovarian cancer   . Family history of breast cancer   . Family history of prostate cancer   . Family history of bladder cancer   . Family history of kidney cancer   . Herpes 10/25/2018  . Anemia 08/07/2017  . Hx of abnormal cervical Pap smear 08/01/2015  . Diabetes mellitus ([place]) 09/13/2014  . Hypertension associated with diabetes ([place]) 11/16/2013  . GERD (gastroesophageal reflux disease) 11/16/2013  . Hearing loss - followed by [facility] 11/16/2013     Past Medical History:  Diagnosis Date  . Abnormal Pap smear of cervix - hpv per report in 2014, seeing gyn 11/16/2013   normal pap with gyn 2015  . Arthritis    hand  . CIN I (cervical intraepithelial neoplasia I) 2020  . Diabetes mellitus without complication ([place])   . Family history of bladder cancer   . Family history of breast cancer   . Family history of kidney cancer   . Family history of ovarian cancer   . Family history of prostate cancer   . Foreign body of hand, right 07/2011   right MP  . GERD (gastroesophageal reflux disease)   . Headache(784.0)    sinus  . HSV-1 (herpes simplex virus 1) infection   . Hypertension    denies HTN, but takes Hyzaar   . Papanicolaou smear of cervix with positive high risk human papilloma virus (HPV) test 2021   Needs cervical cancer screenig in 2022  . Stenosing tenosynovitis of wrist 07/2011   right    Past Surgical History:  Procedure Laterality Date  . CYST EXCISION    . DORSAL COMPARTMENT RELEASE  08/26/2011   Procedure: RELEASE DORSAL COMPARTMENT (DEQUERVAIN);  Surgeon: Cammie Sickle., MD;  Location[facility];  Service: Orthopedics;  Laterality: Right;  . FOREIGN BODY REMOVAL  08/26/2011   Procedure: FOREIGN BODY REMOVAL ADULT;  Surgeon: Cammie Sickle., MD;  Location: [place];  Service: Orthopedics;  Laterality: Right;    Current Outpatient Medications  Medication Sig Dispense Refill  . acetaminophen (TYLENOL) 650 MG CR tablet Take 650 mg by mouth every 8 (eight) hours as needed for pain.    . blood glucose meter kit and supplies KIT Dispense based on patient and insurance preference. Use up to three times daily as directed. (FOR ICD-9 250.00, 250.01). 1 each 0  . CINNAMON PO Take by mouth.    . ELDERBERRY PO Take by mouth.    . ferrous sulfate 325 (65 FE) MG EC tablet Take 325 mg by mouth daily.    . fluticasone (FLONASE) 50 MCG/ACT nasal spray Place into both nostrils daily.     . hydrochlorothiazide (HYDRODIURIL) 25 MG tablet TAKE ONE TABLET BY MOUTH DAILY 30 tablet 2  . IBUPROFEN PO Take by mouth as needed. Knee pain    . JARDIANCE 10 MG TABS tablet TAKE ONE TABLET BY MOUTH DAILY BEFORE BREAKFAST 30 tablet 1  . Lancets (ONETOUCH DELICA PLUS [id number]) MISC USE AS DIRECTED TO TEST BLOOD SUGAR 1-3 TIMES PER DAY 100 each 2  . losartan (COZAAR) 50 MG tablet TAKE ONE TABLET BY MOUTH DAILY 30 tablet 2  . metFORMIN (GLUCOPHAGE) 500 MG tablet TAKE ONE TABLET BY MOUTH TWICE A DAY WITH MEALS 60 tablet 2  . mometasone (ELOCON) 0.1 % cream Apply 1 application topically daily. 45 g 1  . Multiple Vitamins-Minerals (ZINC PO) Take by mouth.    . norethindrone (NORLYDA) 0.35 MG tablet Take 1 tablet (0.35 mg total) by mouth daily. 3 Package 3  . omeprazole (PRILOSEC) 20 MG capsule TAKE ONE CAPSULE BY MOUTH DAILY 30 capsule 3  . OVER THE COUNTER MEDICATION Beet root    . potassium chloride (KLOR-CON) 10 MEQ tablet Take 2 tablets by mouth daily for 3 days, then 1 tablet daily thereafter 90 tablet 0  . SALINE NASAL SPRAY NA Place into the nose as needed.    Manus Gunning BOWEL PREP KIT 17.5-3.13-1.6 GM/177ML SOLN Take by mouth.    . triamcinolone cream (KENALOG) 0.1 % Apply topically daily. 30 g 1  . TURMERIC CURCUMIN PO Take by mouth.    . valACYclovir (VALTREX) 500 MG tablet TAKE ONE TABLET BY MOUTH DAILY 30 tablet 1  . zinc gluconate 50 MG tablet Take 50 mg by mouth daily.     No current facility-administered medications for this visit.     ALLERGIES: Oak bark [quercus robur] and Soap  Family History  Problem Relation Age of Onset  . Hyperlipidemia Mother   . Hypertension Mother   . Hypertension Father   . Stroke Father   . Breast cancer Paternal Grandmother        Age 20s or 6s  . Diabetes Mellitus II Paternal Grandmother   . Dementia Paternal Grandmother   . Stroke Paternal Grandfather   . Hypertension Sister   . Diabetes Sister   . Arthritis Maternal Grandmother   .  Heart failure Maternal Grandfather   . Diabetes Maternal Grandfather   . Cancer Paternal Aunt        bladder or kidney cancer, Age 78/58   . Colon cancer Other        maternal great-aunt,  in her 61s  . Ovarian cancer Cousin 85       maternal uncle's daughter  . Ovarian cancer Other        matrenal great-aunt, in her 36s    Social History   Socioeconomic History  . Marital status: Single    Spouse name: Not on file  . Number of children: 0  . Years of education: Not on file  . Highest education level: Not on file  Occupational History  . Not on file  Tobacco Use  . Smoking status: Never Smoker  . Smokeless tobacco: Never Used  Vaping Use  . Vaping Use: Never used  Substance and Sexual Activity  . Alcohol use: Yes    Alcohol/week: 0.0 standard drinks    Comment: occasionally  . Drug use: No  . Sexual activity: Yes    Birth control/protection: Pill    Comment: Micronor  Other Topics Concern  . Not on file  Social History Narrative   Work or School: works with senior center      Home Situation: lives alone      Spiritual Beliefs: none, Christian      Lifestyle: active at work but not otherwise; avoiding carbs            Social Determinants of Radio broadcast assistant Strain: Not on file  Food Insecurity: Not on file  Transportation Needs: Not on file  Physical Activity: Not on file  Stress: Not on file  Social Connections: Not on file  Intimate Partner Violence: Not on file    Review of Systems  All other systems reviewed and are negative.   PHYSICAL EXAMINATION:    BP 130/82   Pulse 83   Ht '5\' 6"'  (1.676 m)   Wt 202 lb (91.6 kg)   LMP 05/01/2020 (Exact Date)   SpO2 99%   BMI 32.60 kg/m     General appearance: alert, cooperative and appears stated age.  Looks uncomfortable.  Does not want to sit.  ASSESSMENT  Right lower back pain and right lower quadrant pain. Uncertain etiology.  Recent abnormal uterine bleeding, resolved.  Dizziness.   Hx blood in her stool.  PLAN  Ok to continue Micronor. CBC and BMP.  CT of abdomen and pelvis.  If CT is negative, I recommend she follow up with her PCP or orthopedic specialist.  She is scheduled for GI evaluation in January, 2022.   22 min  total time was spent for this patient encounter, including preparation, face-to-face counseling with the patient, coordination of care, and documentation of the encounter.

## 2020-05-20 NOTE — Telephone Encounter (Signed)
Spoke with pt. Pt advised of OV scheduled. Pt states has hair appt at 10 am and would not be able to make the 1115 am appt. Pt scheduled for 1:15 pm with Dr Quincy Simmonds on 05/21/20. Pt agreeable to date and time of appt.  Routing to Dr Quincy Simmonds.   Ok as scheduled?

## 2020-05-20 NOTE — Telephone Encounter (Signed)
Thank you for moving her appointment to 1:15 pm.

## 2020-05-21 ENCOUNTER — Other Ambulatory Visit: Payer: Self-pay

## 2020-05-21 ENCOUNTER — Ambulatory Visit: Payer: 59 | Admitting: Obstetrics and Gynecology

## 2020-05-21 ENCOUNTER — Encounter: Payer: Self-pay | Admitting: Obstetrics and Gynecology

## 2020-05-21 ENCOUNTER — Ambulatory Visit: Payer: Self-pay | Admitting: Obstetrics and Gynecology

## 2020-05-21 VITALS — BP 130/82 | HR 83 | Ht 66.0 in | Wt 202.0 lb

## 2020-05-21 DIAGNOSIS — N939 Abnormal uterine and vaginal bleeding, unspecified: Secondary | ICD-10-CM | POA: Diagnosis not present

## 2020-05-21 DIAGNOSIS — M545 Low back pain, unspecified: Secondary | ICD-10-CM

## 2020-05-21 DIAGNOSIS — R1031 Right lower quadrant pain: Secondary | ICD-10-CM

## 2020-05-21 DIAGNOSIS — R42 Dizziness and giddiness: Secondary | ICD-10-CM

## 2020-05-21 LAB — BASIC METABOLIC PANEL
BUN/Creatinine Ratio: 17 (ref 9–23)
BUN: 13 mg/dL (ref 6–24)
CO2: 22 mmol/L (ref 20–29)
Calcium: 9 mg/dL (ref 8.7–10.2)
Chloride: 104 mmol/L (ref 96–106)
Creatinine, Ser: 0.78 mg/dL (ref 0.57–1.00)
GFR calc Af Amer: 106 mL/min/{1.73_m2} (ref >59)
GFR calc non Af Amer: 92 mL/min/{1.73_m2} (ref >59)
Glucose: 78 mg/dL (ref 65–99)
Potassium: 3.9 mmol/L (ref 3.5–5.2)
Sodium: 138 mmol/L (ref 134–144)

## 2020-05-21 LAB — CBC
Hematocrit: 38.9 % (ref 34.0–46.6)
Hemoglobin: 12.8 g/dL (ref 11.1–15.9)
MCH: 28.7 pg (ref 26.6–33.0)
MCHC: 32.9 g/dL (ref 31.5–35.7)
MCV: 87 fL (ref 79–97)
Platelets: 303 10*3/uL (ref 150–450)
RBC: 4.46 x10E6/uL (ref 3.77–5.28)
RDW: 12.8 % (ref 11.7–15.4)
WBC: 5.1 10*3/uL (ref 3.4–10.8)

## 2020-05-21 NOTE — Progress Notes (Signed)
CT scan ordered per Dr Quincy Simmonds.  Pt scheduled for STAT CT abd pelvis at [facility] on 12/23 at 11 am. Call placed to pt. Spoke with pt. Pt agreeable to CT scan as scheduled.  Routing to [place] for PA Routing to Dr Quincy Simmonds for review.  Encounter closed

## 2020-05-22 ENCOUNTER — Encounter (HOSPITAL_BASED_OUTPATIENT_CLINIC_OR_DEPARTMENT_OTHER): Payer: Self-pay

## 2020-05-22 ENCOUNTER — Ambulatory Visit (HOSPITAL_BASED_OUTPATIENT_CLINIC_OR_DEPARTMENT_OTHER)
Admission: RE | Admit: 2020-05-22 | Discharge: 2020-05-22 | Disposition: A | Discharge status: Home / Self Care | Payer: 59 | Source: Ambulatory Visit | Attending: Obstetrics and Gynecology | Admitting: Obstetrics and Gynecology

## 2020-05-22 ENCOUNTER — Telehealth: Payer: Self-pay | Admitting: Obstetrics and Gynecology

## 2020-05-22 DIAGNOSIS — M545 Low back pain, unspecified: Secondary | ICD-10-CM | POA: Diagnosis not present

## 2020-05-22 DIAGNOSIS — R1031 Right lower quadrant pain: Secondary | ICD-10-CM | POA: Diagnosis present

## 2020-05-22 DIAGNOSIS — K769 Liver disease, unspecified: Secondary | ICD-10-CM

## 2020-05-22 DIAGNOSIS — Q8909 Congenital malformations of spleen: Secondary | ICD-10-CM

## 2020-05-22 MED ORDER — IOHEXOL 300 MG/ML  SOLN
100.0000 mL | Freq: Once | INTRAMUSCULAR | Status: AC | PRN
  Administered 2020-05-22: 100 mL via INTRAVENOUS

## 2020-05-22 NOTE — Telephone Encounter (Signed)
Call placed to patient to discuss her abdominal and pelvic CT scan results.  No answer.  Mailbox is full.  I will contact patient through My Chart.   There are no acute findings.   There is a nonobstructing stone of the left kidney.  There are multiple liver cysts. There is an indeterminate lesion of the liver and the spleen. There are also prominent upper abdominal lymph nodes.  Small uterine fibroids were seen.   She will need a nonemergent MRI of the abdomen with and without contrast for further evaluation of the CT findings.   I recommend she see her primary care provider or orthopedist regarding her back pain.

## 2020-05-22 NOTE — Telephone Encounter (Signed)
Routing to Triage for follow up and planning after the Holiday.

## 2020-05-26 NOTE — Telephone Encounter (Signed)
Call placed to [facility], will need to r/s MRI abdomen after 06/01/20 to allow for precert. Return call to [facility] or patient directly.   Call placed to patient, advised as seen above. Patient is agreeable to plan.

## 2020-05-26 NOTE — Telephone Encounter (Signed)
Spoke with patient. Notified that MRI abdomen w/wo contrast has been r/s to 06/07/20 at [facility].  Patient is agreeable to date and time.   Routing to provider for final review. Patient is agreeable to disposition. Will close encounter.  Cc: Hayley Carder

## 2020-05-26 NOTE — Telephone Encounter (Signed)
Spoke with Lawson Fiscal at [facility].  Patient scheduled for MRI on 06/02/19 at 4pm.   Call returned to patient to notify. Patient verbalizes understanding and is agreeable to date and time.   Routing to provider for final review. Patient is agreeable to disposition. Will close encounter.  Cc: Hayley Carder

## 2020-05-26 NOTE — Telephone Encounter (Signed)
Spoke with patient. Advised of results as seen below per Dr. Edward Jolly.  Patient request to proceed with MRI at [facility].  Advised patient I will call to schedule and return call with appt information, patient agreeable.   Order placed for MRI abdomen w/wo contrast at [facility].  Call placed to [phone], Left message to call Noreene Larsson, RN at [facility] [phone] to schedule MRI or may contact patient directly to schedule.   Placed in IMG hold.

## 2020-05-27 ENCOUNTER — Other Ambulatory Visit: Payer: Self-pay

## 2020-05-28 ENCOUNTER — Ambulatory Visit: Payer: 59 | Admitting: Family Medicine

## 2020-05-28 ENCOUNTER — Encounter: Payer: Self-pay | Admitting: Family Medicine

## 2020-05-28 ENCOUNTER — Other Ambulatory Visit: Payer: Self-pay

## 2020-05-28 VITALS — BP 122/72 | HR 82 | Temp 98.5°F | Ht 66.0 in | Wt 200.0 lb

## 2020-05-28 DIAGNOSIS — M545 Low back pain, unspecified: Secondary | ICD-10-CM

## 2020-05-28 DIAGNOSIS — K7689 Other specified diseases of liver: Secondary | ICD-10-CM

## 2020-05-28 DIAGNOSIS — R3911 Hesitancy of micturition: Secondary | ICD-10-CM

## 2020-05-28 LAB — URINALYSIS
Bilirubin Urine: NEGATIVE
Hgb urine dipstick: NEGATIVE
Ketones, ur: NEGATIVE
Leukocytes,Ua: NEGATIVE
Nitrite: NEGATIVE
Specific Gravity, Urine: 1.02 (ref 1.000–1.030)
Total Protein, Urine: NEGATIVE
Urine Glucose: >=1000 — AB
Urobilinogen, UA: 0.2 (ref 0.0–1.0)
pH: 6 (ref 5.0–8.0)

## 2020-05-28 NOTE — Progress Notes (Signed)
Kelton Pillar DOB: Oct 13, 1975 Encounter date: 05/28/2020  This is a 44 y.o. female who presents with Chief Complaint  Patient presents with   Results    Patient states her GYN referred to PCP due to kidney stones and liver cysts    History of present illness:  Has had a couple of visits with gyn for RLQ pain and right lower back pain. CTA ordered and reported as follows: 1. No acute findings identified within the abdomen or pelvis. 2. Nonobstructing left renal calculus. 3. Multiple liver cysts. 4. Indeterminate lesion within the posterior right lobe of liver measures 1.2 cm. Further evaluation with nonemergent, without and with contrast MRI of the liver is advised. 5. Indeterminate intermediate attenuating structure within the spleen. This may represent a benign abnormality such as a hemangioma or lymphangioma. This could also be assessed at the time of MRI. 6. Prominent upper abdominal lymph nodes are identified. Nonspecific. 7. Small uterine fibroids.  Follow up MRI has already been ordered by Dr. Quincy Simmonds.   Started 12/2 she was bleeding x 2 weeks and had some right sided back pain as well. Hurt to lay down; move around. She states that now she is better; just a little sore. No more vaginal bleeding but period is about to start.   No blood in urine; urine flow was slower and slightly uncomfortable during this time as well.   Colonoscopy set up 1/21. Has had issues with low blood counts so this was set up.   Lower abdominal cramping during time of 2 weeks of bleeding.    Allergies  Allergen Reactions   Oak Bark [Quercus Robur]     Oak trees   Soap Rash    Everything except Dove   Current Meds  Medication Sig   acetaminophen (TYLENOL) 650 MG CR tablet Take 650 mg by mouth every 8 (eight) hours as needed for pain.   blood glucose meter kit and supplies KIT Dispense based on patient and insurance preference. Use up to three times daily as directed. (FOR ICD-9  250.00, 250.01).   CINNAMON PO Take by mouth.   ELDERBERRY PO Take by mouth.   ferrous sulfate 325 (65 FE) MG EC tablet Take 325 mg by mouth daily.   fluticasone (FLONASE) 50 MCG/ACT nasal spray Place into both nostrils daily.   hydrochlorothiazide (HYDRODIURIL) 25 MG tablet TAKE ONE TABLET BY MOUTH DAILY   IBUPROFEN PO Take by mouth as needed. Knee pain   JARDIANCE 10 MG TABS tablet TAKE ONE TABLET BY MOUTH DAILY BEFORE BREAKFAST   Lancets (ONETOUCH DELICA PLUS [id number]) MISC USE AS DIRECTED TO TEST BLOOD SUGAR 1-3 TIMES PER DAY   losartan (COZAAR) 50 MG tablet TAKE ONE TABLET BY MOUTH DAILY   metFORMIN (GLUCOPHAGE) 500 MG tablet TAKE ONE TABLET BY MOUTH TWICE A DAY WITH MEALS   mometasone (ELOCON) 0.1 % cream Apply 1 application topically daily.   Multiple Vitamins-Minerals (ZINC PO) Take by mouth.   norethindrone (NORLYDA) 0.35 MG tablet Take 1 tablet (0.35 mg total) by mouth daily.   omeprazole (PRILOSEC) 20 MG capsule TAKE ONE CAPSULE BY MOUTH DAILY   OVER THE COUNTER MEDICATION Beet root   potassium chloride (KLOR-CON) 10 MEQ tablet Take 2 tablets by mouth daily for 3 days, then 1 tablet daily thereafter   SALINE NASAL SPRAY NA Place into the nose as needed.   SUPREP BOWEL PREP KIT 17.5-3.13-1.6 GM/177ML SOLN Take by mouth.   triamcinolone cream (KENALOG) 0.1 % Apply topically daily.  TURMERIC CURCUMIN PO Take by mouth.   valACYclovir (VALTREX) 500 MG tablet TAKE ONE TABLET BY MOUTH DAILY   zinc gluconate 50 MG tablet Take 50 mg by mouth daily.    Review of Systems  Constitutional: Negative for chills, fatigue and fever.  Respiratory: Negative for cough, chest tightness, shortness of breath and wheezing.   Cardiovascular: Negative for chest pain, palpitations and leg swelling.  Gastrointestinal: Negative for abdominal pain, blood in stool, constipation, diarrhea, nausea and vomiting.  Genitourinary: Positive for menstrual problem (see hpi). Negative for  difficulty urinating (no longer significant issue; but wen pain was present she noted more difficulty with starting stream), dysuria, flank pain (has improved; was very sore last week), frequency, hematuria and vaginal discharge. Vaginal bleeding: see hpi.  Musculoskeletal: Positive for back pain (nearly resolved).  Neurological: Negative for weakness and numbness.    Objective:  BP 122/72 (BP Location: Left Arm, Patient Position: Sitting, Cuff Size: Large)    Pulse 82    Temp 98.5 F (36.9 C) (Oral)    Ht '5\' 6"'  (1.676 m)    Wt 200 lb (90.7 kg)    LMP 05/01/2020 (Exact Date)    BMI 32.28 kg/m   Weight: 200 lb (90.7 kg)   BP Readings from Last 3 Encounters:  05/28/20 122/72  05/21/20 130/82  05/15/20 108/70   Wt Readings from Last 3 Encounters:  05/28/20 200 lb (90.7 kg)  05/21/20 202 lb (91.6 kg)  05/15/20 197 lb (89.4 kg)    Physical Exam Constitutional:      General: She is not in acute distress.    Appearance: She is well-developed.  Cardiovascular:     Rate and Rhythm: Normal rate and regular rhythm.     Heart sounds: Normal heart sounds. No murmur heard. No friction rub.  Pulmonary:     Effort: Pulmonary effort is normal. No respiratory distress.     Breath sounds: Normal breath sounds. No wheezing or rales.  Musculoskeletal:     Right lower leg: No edema.     Left lower leg: No edema.     Comments: Full ROM of back on exam without significant limitation. She does have a slight right SI tenderness with full flexion and flexion/twist. Otherwise negative exam.   No limitation with ROM of hips bilat. There is mild right SI tenderness with external rotation right hip.   Neurological:     Mental Status: She is alert and oriented to person, place, and time.  Psychiatric:        Behavior: Behavior normal.     Assessment/Plan  1. Urinary hesitancy Will make sure no urinary tract abnormality; differential given symptoms would include nephrolithiasis. - Urine Culture;  Future - Urinalysis; Future - Urine Culture - Urinalysis  2. Acute right-sided low back pain without sciatica Back pain has significantly improved. We discussed that further eval/treatment will be pending MRI results. Could consider further back eval if pain worsens, but at this point would recommend stretching and core strengthening for back support.   3. Hepatic cyst MRI is pending.    Return if symptoms worsen or fail to improve.  32 minutes spent in charting, chart review, exam, time with patient reviewing prior MRI results, discussion for future eval/potential treatment options for back pain.   Micheline Rough, MD

## 2020-05-29 LAB — URINE CULTURE
MICRO NUMBER:: 11366366
SPECIMEN QUALITY:: ADEQUATE

## 2020-06-01 ENCOUNTER — Other Ambulatory Visit (HOSPITAL_BASED_OUTPATIENT_CLINIC_OR_DEPARTMENT_OTHER): Payer: 59

## 2020-06-06 ENCOUNTER — Other Ambulatory Visit: Payer: Self-pay | Admitting: Family Medicine

## 2020-06-07 ENCOUNTER — Ambulatory Visit (HOSPITAL_BASED_OUTPATIENT_CLINIC_OR_DEPARTMENT_OTHER)
Admission: RE | Admit: 2020-06-07 | Discharge: 2020-06-07 | Disposition: A | Discharge status: Home / Self Care | Payer: 59 | Source: Ambulatory Visit | Attending: Obstetrics and Gynecology | Admitting: Obstetrics and Gynecology

## 2020-06-07 ENCOUNTER — Other Ambulatory Visit: Payer: Self-pay

## 2020-06-07 DIAGNOSIS — M545 Low back pain, unspecified: Secondary | ICD-10-CM | POA: Insufficient documentation

## 2020-06-07 DIAGNOSIS — K769 Liver disease, unspecified: Secondary | ICD-10-CM

## 2020-06-07 DIAGNOSIS — Q8909 Congenital malformations of spleen: Secondary | ICD-10-CM

## 2020-06-07 DIAGNOSIS — R1031 Right lower quadrant pain: Secondary | ICD-10-CM | POA: Diagnosis present

## 2020-06-07 MED ORDER — GADOBUTROL 1 MMOL/ML IV SOLN
9.0000 mL | Freq: Once | INTRAVENOUS | Status: AC | PRN
  Administered 2020-06-07: 9 mL via INTRAVENOUS

## 2020-06-09 ENCOUNTER — Ambulatory Visit: Payer: 59 | Admitting: Family Medicine

## 2020-06-13 ENCOUNTER — Ambulatory Visit: Payer: 59 | Admitting: Family Medicine

## 2020-06-13 ENCOUNTER — Other Ambulatory Visit: Payer: Self-pay

## 2020-06-13 ENCOUNTER — Encounter: Payer: Self-pay | Admitting: Family Medicine

## 2020-06-13 VITALS — BP 118/90 | HR 82 | Temp 98.4°F | Ht 66.0 in | Wt 203.8 lb

## 2020-06-13 DIAGNOSIS — M545 Low back pain, unspecified: Secondary | ICD-10-CM | POA: Diagnosis not present

## 2020-06-13 DIAGNOSIS — E611 Iron deficiency: Secondary | ICD-10-CM

## 2020-06-13 DIAGNOSIS — I152 Hypertension secondary to endocrine disorders: Secondary | ICD-10-CM

## 2020-06-13 DIAGNOSIS — E1159 Type 2 diabetes mellitus with other circulatory complications: Secondary | ICD-10-CM

## 2020-06-13 DIAGNOSIS — E119 Type 2 diabetes mellitus without complications: Secondary | ICD-10-CM

## 2020-06-13 DIAGNOSIS — Z13220 Encounter for screening for lipoid disorders: Secondary | ICD-10-CM

## 2020-06-13 NOTE — Patient Instructions (Addendum)
Please check your blood pressures at home. Let me know if you are regularly getting numbers over 758 systolic or 85 diastolic.   Please get bloodwork in 3 months prior to next visit.

## 2020-06-13 NOTE — Progress Notes (Signed)
Kelton Pillar DOB: 05/01/1976 Encounter date: 06/13/2020  This is a 45 y.o. female who presents with Chief Complaint  Patient presents with  . Follow-up    History of present illness: Last visit was for followup after some ongoing RLQ and right lower back pain. MRI was completed due to hepatic/splenic abnormalities on CT imaging. 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 Recommend continued follow-up by MRI in 6 months to confirm stability. Multiple hepatic cysts, and 2 tiny benign hemangiomas in the right hepatic lobe.   Back still bothering her. Bothers her with lifting leg; moving leg over - just in right lower back. No numbness, tingling, radiation of pain. Sleeping with heating pad; more comfortable on left side. Sometimes still gets sharp pains or numb pains into RLQ anterior abdomen. Little "zinging" pains. Not cramp. She is on menses now.   Colonoscopy is set up for next Friday. Stools are dark, but she is taking iron pills.   Allergies  Allergen Reactions  . Oak Bark [Quercus Conception trees  . Soap Rash    Everything except Dove   Current Meds  Medication Sig  . acetaminophen (TYLENOL) 650 MG CR tablet Take 650 mg by mouth every 8 (eight) hours as needed for pain.  . blood glucose meter kit and supplies KIT Dispense based on patient and insurance preference. Use up to three times daily as directed. (FOR ICD-9 250.00, 250.01).  . CINNAMON PO Take by mouth.  . ELDERBERRY PO Take by mouth.  . ferrous sulfate 325 (65 FE) MG EC tablet Take 325 mg by mouth daily.  . fluticasone (FLONASE) 50 MCG/ACT nasal spray Place into both nostrils daily.  . hydrochlorothiazide (HYDRODIURIL) 25 MG tablet TAKE ONE TABLET BY MOUTH DAILY  . IBUPROFEN PO Take by mouth as needed. Knee pain  . JARDIANCE 10 MG TABS tablet TAKE ONE TABLET BY MOUTH DAILY BEFORE BREAKFAST   . Lancets (ONETOUCH DELICA PLUS [id number]) MISC USE AS DIRECTED TO TEST BLOOD SUGAR 1-3 TIMES PER DAY  . losartan (COZAAR) 50 MG tablet TAKE ONE TABLET BY MOUTH DAILY  . metFORMIN (GLUCOPHAGE) 500 MG tablet TAKE ONE TABLET BY MOUTH TWICE A DAY WITH MEALS  . mometasone (ELOCON) 0.1 % cream Apply 1 application topically daily.  . Multiple Vitamins-Minerals (ZINC PO) Take by mouth.  . norethindrone (NORLYDA) 0.35 MG tablet Take 1 tablet (0.35 mg total) by mouth daily.  Marland Kitchen omeprazole (PRILOSEC) 20 MG capsule TAKE ONE CAPSULE BY MOUTH DAILY  . OVER THE COUNTER MEDICATION Beet root  . potassium chloride (KLOR-CON) 10 MEQ tablet Take 2 tablets by mouth daily for 3 days, then 1 tablet daily thereafter  . SALINE NASAL SPRAY NA Place into the nose as needed.  Manus Gunning BOWEL PREP KIT 17.5-3.13-1.6 GM/177ML SOLN Take by mouth.  . triamcinolone cream (KENALOG) 0.1 % Apply topically daily.  . TURMERIC CURCUMIN PO Take by mouth.  . valACYclovir (VALTREX) 500 MG tablet TAKE ONE TABLET BY MOUTH DAILY  . zinc gluconate 50 MG tablet Take 50 mg by mouth daily.    Review of Systems  Constitutional: Negative for chills, fatigue and fever.  Respiratory: Negative for cough, chest tightness, shortness of breath and wheezing.   Cardiovascular: Negative for chest pain, palpitations and leg swelling.  Genitourinary: Negative for difficulty urinating, dysuria and flank pain.  Musculoskeletal: Positive for back pain. Negative for myalgias.  Objective:  BP 118/90 (BP Location: Left Arm, Patient Position: Sitting, Cuff Size: Large)   Pulse 82   Temp 98.4 F (36.9 C) (Oral)   Ht _0  (1.676 m)   Wt 203 lb 12.8 oz (92.4 kg)   BMI 32.89 kg/m   Weight: 203 lb 12.8 oz (92.4 kg)   BP Readings from Last 3 Encounters:  06/13/20 118/90  05/28/20 122/72  05/21/20 130/82   Wt Readings from Last 3 Encounters:  06/13/20 203 lb 12.8 oz (92.4 kg)  05/28/20 200 lb (90.7 kg)  05/21/20 202 lb (91.6 kg)    Physical  Exam Constitutional:      General: She is not in acute distress.    Appearance: She is well-developed.  Cardiovascular:     Rate and Rhythm: Normal rate and regular rhythm.     Heart sounds: Normal heart sounds. No murmur heard. No friction rub.  Pulmonary:     Effort: Pulmonary effort is normal. No respiratory distress.     Breath sounds: Normal breath sounds. No wheezing or rales.  Musculoskeletal:     Right lower leg: No edema.     Left lower leg: No edema.     Comments: Flexibility is good with the back.  She only exhibits tenderness with twist and extension, more notable to the right.  She does have tenderness to palpation paralumbar musculature on the right around L3.  Mobility is much improved from previous visit.  Neurological:     Mental Status: She is alert and oriented to person, place, and time.  Psychiatric:        Behavior: Behavior normal.     Assessment/Plan  1. Right-sided low back pain without sciatica, unspecified chronicity I do feel the back pain is musculoskeletal in nature.  Has significantly resolved, but does still bother her.  I am going to refer her to sports medicine for their evaluation and treatment.  She prefers to avoid any sort of interventional treatment, so I think this is the next best step. - Ambulatory referral to Sports Medicine  2. Hypertension associated with diabetes ([place]) Blood pressure slightly high today, same on recheck.  I have asked patient to check her blood pressures at home and to let me know if she is regularly getting numbers over 358 systolic or 85 diastolic. - CBC with Differential/Platelet; Future - Comprehensive metabolic panel; Future  3. Type 2 diabetes mellitus without complication, without long-term current use of insulin ([place]) Continue to work on healthier eating and lower carbohydrate diet.  Recheck blood work in 3 months time. - Hemoglobin A1c; Future - Microalbumin / creatinine urine ratio; Future  4. Iron  deficiency She is actively taking iron pills.  She has follow-up with GI for colonoscopy this week.  5. Lipid screening - Lipid panel; Future    Return in about 3 months (around 09/11/2020) for Chronic condition visit; labwork prior.    Micheline Rough, MD

## 2020-06-19 ENCOUNTER — Encounter: Payer: Self-pay | Admitting: Gastroenterology

## 2020-06-19 ENCOUNTER — Other Ambulatory Visit: Payer: Self-pay | Admitting: Gastroenterology

## 2020-06-19 ENCOUNTER — Ambulatory Visit (AMBULATORY_SURGERY_CENTER): Payer: 59 | Admitting: Gastroenterology

## 2020-06-19 ENCOUNTER — Other Ambulatory Visit: Payer: Self-pay

## 2020-06-19 VITALS — BP 119/81 | HR 81 | Temp 98.7°F | Resp 16 | Ht 66.0 in | Wt 201.0 lb

## 2020-06-19 DIAGNOSIS — R195 Other fecal abnormalities: Secondary | ICD-10-CM

## 2020-06-19 DIAGNOSIS — K219 Gastro-esophageal reflux disease without esophagitis: Secondary | ICD-10-CM

## 2020-06-19 DIAGNOSIS — K317 Polyp of stomach and duodenum: Secondary | ICD-10-CM | POA: Diagnosis not present

## 2020-06-19 DIAGNOSIS — D509 Iron deficiency anemia, unspecified: Secondary | ICD-10-CM

## 2020-06-19 DIAGNOSIS — K640 First degree hemorrhoids: Secondary | ICD-10-CM

## 2020-06-19 MED ORDER — SODIUM CHLORIDE 0.9 % IV SOLN
500.0000 mL | INTRAVENOUS | Status: DC
Start: 2020-06-19 — End: 2020-06-19

## 2020-06-19 NOTE — Op Note (Signed)
[place] Patient Name: Linda Conrad Procedure Date: 06/19/2020 3:02 PM MRN: [id number] Endoscopist: Ladene Artist , MD Age: 45 Referring MD:  Date of Birth: 1975-07-12 Gender: Female Account #: [id number] Procedure:                Colonoscopy Indications:              Heme positive stool, Unexplained iron deficiency                            anemia Medicines:                Monitored Anesthesia Care Procedure:                Pre-Anesthesia Assessment:                           - Prior to the procedure, a History and Physical                            was performed, and patient medications and                            allergies were reviewed. The patient's tolerance of                            previous anesthesia was also reviewed. The risks                            and benefits of the procedure and the sedation                            options and risks were discussed with the patient.                            All questions were answered, and informed consent                            was obtained. Prior Anticoagulants: The patient has                            taken no previous anticoagulant or antiplatelet                            agents. ASA Grade Assessment: II - A patient with                            mild systemic disease. After reviewing the risks                            and benefits, the patient was deemed in                            satisfactory condition to undergo the procedure.  After obtaining informed consent, the colonoscope                            was passed under direct vision. Throughout the                            procedure, the patient's blood pressure, pulse, and                            oxygen saturations were monitored continuously. The                            Olympus CF-HQ190 [id number]) Colonoscope was                            introduced through the anus and advanced to the the                             cecum, identified by appendiceal orifice and                            ileocecal valve. The ileocecal valve, appendiceal                            orifice, and rectum were photographed. The quality                            of the bowel preparation was good. The colonoscopy                            was performed without difficulty. The patient                            tolerated the procedure well. Scope In: 3:14:33 PM Scope Out: 3:31:35 PM Scope Withdrawal Time: 0 hours 12 minutes 38 seconds  Total Procedure Duration: 0 hours 17 minutes 2 seconds  Findings:                 The perianal and digital rectal examinations were                            normal.                           Internal hemorrhoids were found during                            retroflexion. The hemorrhoids were small and Grade                            I (internal hemorrhoids that do not prolapse).                           The exam was otherwise without abnormality on  direct and retroflexion views. Complications:            No immediate complications. Estimated blood loss:                            None. Estimated Blood Loss:     Estimated blood loss: none. Impression:               - Small internal hemorrhoids.                           - The examination was otherwise normal on direct                            and retroflexion views.                           - No specimens collected. Recommendation:           - Repeat colonoscopy in 10 years for screening                            purposes.                           - Patient has a contact number available for                            emergencies. The signs and symptoms of potential                            delayed complications were discussed with the                            patient. Return to normal activities tomorrow.                            Written discharge instructions were provided to the                             patient.                           - Resume previous diet.                           - Continue present medications. Ladene Artist, MD 06/19/2020 3:42:53 PM This report has been signed electronically.

## 2020-06-19 NOTE — Patient Instructions (Signed)
Discharge instructions given. Handout on Hemorrhoids. Biopsies taken. Resume previous medications. YOU HAD AN ENDOSCOPIC PROCEDURE TODAY AT [facility]:   Refer to the procedure report that was given to you for any specific questions about what was found during the examination.  If the procedure report does not answer your questions, please call your gastroenterologist to clarify.  If you requested that your care partner not be given the details of your procedure findings, then the procedure report has been included in a sealed envelope for you to review at your convenience later.  YOU SHOULD EXPECT: Some feelings of bloating in the abdomen. Passage of more gas than usual.  Walking can help get rid of the air that was put into your GI tract during the procedure and reduce the bloating. If you had a lower endoscopy (such as a colonoscopy or flexible sigmoidoscopy) you may notice spotting of blood in your stool or on the toilet paper. If you underwent a bowel prep for your procedure, you may not have a normal bowel movement for a few days.  Please Note:  You might notice some irritation and congestion in your nose or some drainage.  This is from the oxygen used during your procedure.  There is no need for concern and it should clear up in a day or so.  SYMPTOMS TO REPORT IMMEDIATELY:   Following lower endoscopy (colonoscopy or flexible sigmoidoscopy):  Excessive amounts of blood in the stool  Significant tenderness or worsening of abdominal pains  Swelling of the abdomen that is new, acute  Fever of 100F or higher   Following upper endoscopy (EGD)  Vomiting of blood or coffee ground material  New chest pain or pain under the shoulder blades  Painful or persistently difficult swallowing  New shortness of breath  Fever of 100F or higher  Black, tarry-looking stools  For urgent or emergent issues, a gastroenterologist can be reached at any hour by calling [phone]. Do  not use MyChart messaging for urgent concerns.    DIET:  We do recommend a small meal at first, but then you may proceed to your regular diet.  Drink plenty of fluids but you should avoid alcoholic beverages for 24 hours.  ACTIVITY:  You should plan to take it easy for the rest of today and you should NOT DRIVE or use heavy machinery until tomorrow (because of the sedation medicines used during the test).    FOLLOW UP: Our staff will call the number listed on your records 48-72 hours following your procedure to check on you and address any questions or concerns that you may have regarding the information given to you following your procedure. If we do not reach you, we will leave a message.  We will attempt to reach you two times.  During this call, we will ask if you have developed any symptoms of COVID 19. If you develop any symptoms (ie: fever, flu-like symptoms, shortness of breath, cough etc.) before then, please call [phone].  If you test positive for Covid 19 in the 2 weeks post procedure, please call and report this information to [place].    If any biopsies were taken you will be contacted by phone or by letter within the next 1-3 weeks.  Please call us at [phone] if you have not heard about the biopsies in 3 weeks.    SIGNATURES/CONFIDENTIALITY: You and/or your care partner have signed paperwork which will be entered into your electronic medical record.  These  signatures attest to the fact that that the information above on your After Visit Summary has been reviewed and is understood.  Full responsibility of the confidentiality of this discharge information lies with you and/or your care-partner. 

## 2020-06-19 NOTE — Op Note (Signed)
[place] Patient Name: Linda Conrad Procedure Date: 06/19/2020 3:01 PM MRN: [id number] Endoscopist: Ladene Artist , MD Age: 45 Referring MD:  Date of Birth: [birth year] Gender: Female Account #: [id number] Procedure:                Upper GI endoscopy Indications:              Unexplained iron deficiency anemia,                            Gastroesophageal reflux disease, Heme positive stool Medicines:                Monitored Anesthesia Care Procedure:                Pre-Anesthesia Assessment:                           - Prior to the procedure, a History and Physical                            was performed, and patient medications and                            allergies were reviewed. The patient's tolerance of                            previous anesthesia was also reviewed. The risks                            and benefits of the procedure and the sedation                            options and risks were discussed with the patient.                            All questions were answered, and informed consent                            was obtained. Prior Anticoagulants: The patient has                            taken no previous anticoagulant or antiplatelet                            agents. ASA Grade Assessment: II - A patient with                            mild systemic disease. After reviewing the risks                            and benefits, the patient was deemed in                            satisfactory condition to undergo the procedure.  After obtaining informed consent, the endoscope was                            passed under direct vision. Throughout the                            procedure, the patient's blood pressure, pulse, and                            oxygen saturations were monitored continuously. The                            Endoscope was introduced through the mouth, and                            advanced to the  second part of duodenum. The upper                            GI endoscopy was accomplished without difficulty.                            The patient tolerated the procedure well. Scope In: Scope Out: Findings:                 The examined esophagus was normal.                           Multiple 3 to 6 mm sessile polyps with no bleeding                            and no stigmata of recent bleeding were found in                            the gastric fundus and in the gastric body.                            Biopsies were taken with a cold forceps for                            histology.                           The exam of the stomach was otherwise normal.                           The duodenal bulb and second portion of the                            duodenum were normal. Biopsies for histology were                            taken with a cold forceps for evaluation of celiac  disease. Complications:            No immediate complications. Estimated Blood Loss:     Estimated blood loss was minimal. Impression:               - Normal esophagus.                           - Multiple gastric polyps. Biopsied.                           - Normal duodenal bulb and second portion of the                            duodenum. Biopsied. Recommendation:           - Patient has a contact number available for                            emergencies. The signs and symptoms of potential                            delayed complications were discussed with the                            patient. Return to normal activities tomorrow.                            Written discharge instructions were provided to the                            patient.                           - Resume previous diet.                           - Follow antireflux measures.                           - Continue present medications.                           - Await pathology results. Ladene Artist,  MD 06/19/2020 3:46:39 PM This report has been signed electronically.

## 2020-06-19 NOTE — Progress Notes (Signed)
Called to room to assist during endoscopic procedure.  Patient ID and intended procedure confirmed with present staff. Received instructions for my participation in the procedure from the performing physician.  

## 2020-06-19 NOTE — Progress Notes (Signed)
Report to PACU, RN, vss, BBS= Clear.  

## 2020-06-19 NOTE — Progress Notes (Signed)
VS- [place]  covid vaccines x3  Cell phone off per pt

## 2020-06-20 ENCOUNTER — Telehealth: Payer: Self-pay | Admitting: *Deleted

## 2020-06-20 ENCOUNTER — Encounter: Payer: 59 | Admitting: Gastroenterology

## 2020-06-20 NOTE — Telephone Encounter (Signed)
-----   Message from Ramond Craver, [place] sent at 06/18/2020 10:49 AM EST ----- Regarding: referral to [facility] first available  Per Dr. Quincy Simmonds:  "Please contact patient in follow up to her MRI result.  She has multiple liver cysts and benign hemangiomas, groups of dilated blood vessels.  Her spleen contains a 2 cm area which may be either a sclerosing angiomatoid nodular transformation or an atypical hamartoma.  None of the areas of the liver or spleen are expected to be cancer.  I would like for her to see a general surgeon at [facility] surgery, first available provider, to advise and follow her further regarding her spleen.  She will need to do an MRI in follow up through the surgeon. "  Patient has been informed and no you are working on her referral.  Thanks!

## 2020-06-20 NOTE — Telephone Encounter (Signed)
I sent staff message on central [facility] surgery referral coordinator to call and schedule.

## 2020-06-23 ENCOUNTER — Telehealth: Payer: Self-pay

## 2020-06-23 ENCOUNTER — Telehealth: Payer: Self-pay | Admitting: *Deleted

## 2020-06-23 NOTE — Telephone Encounter (Signed)
  Follow up Call-  Call back number 06/19/2020  Post procedure Call Back phone  # [phone]  Permission to leave phone message No  Some recent data might be hidden     No answer at # given.  Unable to leave a message d/t full VM box.

## 2020-06-23 NOTE — Telephone Encounter (Signed)
Called [phone] and left a message we tried to reach pt for a follow up call. maw

## 2020-06-24 NOTE — Telephone Encounter (Signed)
Patient scheduled with Dr. Kieth Brightly 07/17/20 @ 2:10 pm arriving @ 1:40 pm

## 2020-06-26 ENCOUNTER — Other Ambulatory Visit: Payer: Self-pay | Admitting: Family Medicine

## 2020-06-27 ENCOUNTER — Encounter: Payer: Self-pay | Admitting: Family Medicine

## 2020-07-02 ENCOUNTER — Encounter: Payer: Self-pay | Admitting: Gastroenterology

## 2020-07-02 NOTE — Progress Notes (Deleted)
[place] Phone: [phone] Subjective:    I'm seeing this patient by the request  of:  Caren Macadam, MD  CC:   [id number]  Pamula Luther is a 45 y.o. female coming in with complaint of low back pain.   Onset-  Location Duration-  Character- Aggravating factors- Reliving factors-  Therapies tried-  Severity-  Xray lumbar spine 2016 IMPRESSION: There is no acute bony abnormality of the lumbar spine. Minimal degenerative disc changes are noted without significant height loss. There is no compression fracture.  AVOID ANTI_INFLAMMATORIES   Patient did have an MRI of the abdomen done January 2022.  Found to have a 2 cm lesion in the spleen for a atypical benign lesion.  Past Medical History:  Diagnosis Date  . Abnormal Pap smear of cervix - hpv per report in 2014, seeing gyn 11/16/2013   normal pap with gyn 2015  . Anemia   . Arthritis    hand  . CIN I (cervical intraepithelial neoplasia I) 2020  . Diabetes mellitus without complication ([place])   . Family history of bladder cancer   . Family history of breast cancer   . Family history of kidney cancer   . Family history of ovarian cancer   . Family history of prostate cancer   . Foreign body of hand, right 07/2011   right MP  . GERD (gastroesophageal reflux disease)   . Headache(784.0)    sinus  . HSV-1 (herpes simplex virus 1) infection   . Hyperlipidemia   . Hypertension    denies HTN, but takes Hyzaar   . Kidney stones   . Papanicolaou smear of cervix with positive high risk human papilloma virus (HPV) test 2021   Needs cervical cancer screenig in 2022  . 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 CYST EXCISION     right thumb  . DORSAL COMPARTMENT RELEASE  08/26/2011   Procedure: RELEASE DORSAL COMPARTMENT (DEQUERVAIN);  Surgeon: Cammie Sickle., MD;   Location: [facility];  Service: Orthopedics;  Laterality: Right;  . FOREIGN BODY REMOVAL  08/26/2011   Procedure: FOREIGN BODY REMOVAL ADULT;  Surgeon: Cammie Sickle., MD;  Location: [place];  Service: Orthopedics;  Laterality: Right;   Social History   Socioeconomic History  . Marital status: Single    Spouse name: Not on file  . Number of children: 0  . Years of education: Not on file  . Highest education level: Not on file  Occupational History  . Not on file  Tobacco Use  . Smoking status: Never Smoker  . Smokeless tobacco: Never Used  Vaping Use  . Vaping Use: Never used  Substance and Sexual Activity  . Alcohol use: Yes    Alcohol/week: 0.0 standard drinks    Comment: occasionally  . Drug use: No  . Sexual activity: Yes    Birth control/protection: Pill    Comment: Micronor  Other Topics Concern  . Not on file  Social History Narrative   Work or School: works with senior center      Home Situation: lives alone      Spiritual Beliefs: none, Christian      Lifestyle: active at work but not otherwise; avoiding carbs            Social Determinants of Radio broadcast assistant  Strain: Not on file  Food Insecurity: Not on file  Transportation Needs: Not on file  Physical Activity: Not on file  Stress: Not on file  Social Connections: Not on file   Allergies  Allergen Reactions  . Codeine     Family hx of allergy  . Oak Bark [Quercus [place] trees  . Soap Rash    Everything except Dove   Family History  Problem Relation Age of Onset  . Hyperlipidemia Mother   . Hypertension Mother   . Hypertension Father   . Stroke Father   . Breast cancer Paternal Grandmother        Age 63s or 64s  . Diabetes Mellitus II Paternal Grandmother   . Dementia Paternal Grandmother   . Stroke Paternal Grandfather   . Hypertension Sister   . Diabetes Sister   . Arthritis Maternal Grandmother   . Heart failure Maternal  Grandfather   . Diabetes Maternal Grandfather   . Cancer Paternal Aunt        bladder or kidney cancer, Age 37/58   . Colon cancer Other        maternal great-aunt, in her 30s  . Ovarian cancer Cousin 36       maternal uncle's daughter  . Ovarian cancer Other        matrenal great-aunt, in her 51s  . Esophageal cancer Neg Hx   . Stomach cancer Neg Hx   . Rectal cancer Neg Hx     Current Outpatient Medications (Endocrine & Metabolic):  Marland Kitchen  JARDIANCE 10 MG TABS tablet, TAKE ONE TABLET BY MOUTH DAILY BEFORE BREAKFAST .  metFORMIN (GLUCOPHAGE) 500 MG tablet, TAKE ONE TABLET BY MOUTH TWICE A DAY WITH MEALS .  norethindrone (NORLYDA) 0.35 MG tablet, Take 1 tablet (0.35 mg total) by mouth daily.  Current Outpatient Medications (Cardiovascular):  .  hydrochlorothiazide (HYDRODIURIL) 25 MG tablet, TAKE ONE TABLET BY MOUTH DAILY .  losartan (COZAAR) 50 MG tablet, TAKE ONE TABLET BY MOUTH DAILY  Current Outpatient Medications (Respiratory):  .  fluticasone (FLONASE) 50 MCG/ACT nasal spray, Place into both nostrils daily. Marland Kitchen  SALINE NASAL SPRAY NA, Place into the nose as needed.  Current Outpatient Medications (Analgesics):  .  acetaminophen (TYLENOL) 650 MG CR tablet, Take 650 mg by mouth every 8 (eight) hours as needed for pain. .  IBUPROFEN PO, Take by mouth as needed. Knee pain  Current Outpatient Medications (Hematological):  .  ferrous sulfate 325 (65 FE) MG EC tablet, Take 325 mg by mouth daily.  Current Outpatient Medications (Other):  .  blood glucose meter kit and supplies KIT, Dispense based on patient and insurance preference. Use up to three times daily as directed. (FOR ICD-9 250.00, 250.01). .  CINNAMON PO, Take by mouth. .  ELDERBERRY PO, Take by mouth. .  Lancets (ONETOUCH DELICA PLUS [id number]) MISC, USE AS DIRECTED TO TEST BLOOD SUGAR 1-3 TIMES PER DAY .  mometasone (ELOCON) 0.1 % cream, Apply 1 application topically daily. .  Multiple Vitamins-Minerals (ZINC PO), Take  by mouth. Marland Kitchen  omeprazole (PRILOSEC) 20 MG capsule, TAKE ONE CAPSULE BY MOUTH DAILY .  OVER THE COUNTER MEDICATION, Beet root .  potassium chloride (KLOR-CON) 10 MEQ tablet, TAKE TWO TABLETS BY MOUTH DAILY FOR 3 DAYS, THEN ONE TABLET BY MOUTH DAILY THEREAFTER .  triamcinolone cream (KENALOG) 0.1 %, Apply topically daily. .  TURMERIC CURCUMIN PO, Take by mouth. .  valACYclovir (VALTREX) 500 MG tablet, TAKE ONE  TABLET BY MOUTH DAILY .  zinc gluconate 50 MG tablet, Take 50 mg by mouth daily.   Reviewed prior external information including notes and imaging from  primary care provider As well as notes that were available from care everywhere and other healthcare systems.  Past medical history, social, surgical and family history all reviewed in electronic medical record.  No pertanent information unless stated regarding to the chief complaint.   Review of Systems:  No headache, visual changes, nausea, vomiting, diarrhea, constipation, dizziness, abdominal pain, skin rash, fevers, chills, night sweats, weight loss, swollen lymph nodes, body aches, joint swelling, chest pain, shortness of breath, mood changes. POSITIVE muscle aches  Objective  There were no vitals taken for this visit.   General: No apparent distress alert and oriented x3 mood and affect normal, dressed appropriately.  HEENT: Pupils equal, extraocular movements intact  Respiratory: Patient's speak in full sentences and does not appear short of breath  Cardiovascular: No lower extremity edema, non tender, no erythema  Gait normal with good balance and coordination.  MSK:  Non tender with full range of motion and good stability and symmetric strength and tone of shoulders, elbows, wrist, hip, knee and ankles bilaterally.     Impression and Recommendations:     The above documentation has been reviewed and is accurate and complete Jacqualin Combes

## 2020-07-03 ENCOUNTER — Ambulatory Visit: Payer: 59 | Admitting: Family Medicine

## 2020-07-10 LAB — HM DIABETES EYE EXAM

## 2020-07-26 ENCOUNTER — Other Ambulatory Visit: Payer: Self-pay | Admitting: Family Medicine

## 2020-07-29 ENCOUNTER — Other Ambulatory Visit: Payer: Self-pay | Admitting: Family Medicine

## 2020-08-14 ENCOUNTER — Other Ambulatory Visit: Payer: Self-pay | Admitting: Family Medicine

## 2020-08-14 DIAGNOSIS — Z1231 Encounter for screening mammogram for malignant neoplasm of breast: Secondary | ICD-10-CM

## 2020-08-20 ENCOUNTER — Encounter: Payer: Self-pay | Admitting: Family Medicine

## 2020-09-05 ENCOUNTER — Other Ambulatory Visit: Payer: 59

## 2020-09-07 ENCOUNTER — Other Ambulatory Visit: Payer: Self-pay | Admitting: Family Medicine

## 2020-09-10 ENCOUNTER — Ambulatory Visit: Payer: 59 | Admitting: Family Medicine

## 2020-09-10 ENCOUNTER — Other Ambulatory Visit: Payer: Self-pay | Admitting: Family Medicine

## 2020-09-10 DIAGNOSIS — Z0289 Encounter for other administrative examinations: Secondary | ICD-10-CM

## 2020-09-10 NOTE — Progress Notes (Deleted)
  Linda Conrad DOB: Apr 08, 1976 Encounter date: 09/10/2020  This is a 45 y.o. female who presents with No chief complaint on file.   History of present illness: Hypertension: Hydrochlorothiazide 25 mg daily, Cozaar 50 mg daily Type 2 diabetes: Jardiance 10 mg daily, Metformin 500 mg daily GERD: Omeprazole 20 mg daily  She has completed colonoscopy since last visit (colonoscopy completed 06/19/2020) and was found to have grade 1 small internal hemorrhoids, but exam is otherwise normal.  This is done due to iron deficiency.  Endoscopy done at the same time was also negative for abnormalities.  (Biopsy showed benign fundic gland polyps)  bloodwork was already ordered to complete today   HPI   Allergies  Allergen Reactions  . Codeine     Family hx of allergy  . Oak Bark [Quercus [place] trees  . Soap Rash    Everything except Dove   No outpatient medications have been marked as taking for the 09/10/20 encounter (Appointment) with Caren Macadam, MD.    Review of Systems  Objective:  There were no vitals taken for this visit.      BP Readings from Last 3 Encounters:  06/19/20 119/81  06/13/20 118/90  05/28/20 122/72   Wt Readings from Last 3 Encounters:  06/19/20 201 lb (91.2 kg)  06/13/20 203 lb 12.8 oz (92.4 kg)  05/28/20 200 lb (90.7 kg)    Physical Exam  Assessment/Plan  There are no diagnoses linked to this encounter.       Micheline Rough, MD

## 2020-09-11 ENCOUNTER — Other Ambulatory Visit: Payer: 59

## 2020-09-12 ENCOUNTER — Ambulatory Visit: Payer: 59 | Admitting: Family Medicine

## 2020-09-23 ENCOUNTER — Other Ambulatory Visit: Payer: Self-pay | Admitting: Obstetrics and Gynecology

## 2020-09-23 NOTE — Telephone Encounter (Signed)
Medication refill request: Norlyda Last AEX:  10-01-19 BS Next AEX: 10-01-20 Last MMG (if hormonal medication request): 12-26-19 density C/BIRADS 1 negative  Refill authorized: Today, please advise.   Medication pended for #28, 0RF. Please refill if appropriate.

## 2020-09-30 NOTE — Progress Notes (Signed)
45 y.o. G0P0000 Single African American female here for annual exam.    Patient had an MRI 06/07/20  for right sided back pain. She has multiple liver cysts and benign hemangiomas, groups of dilated blood vessels.  Her spleen contains a 2 cm area which may be either a sclerosing angiomatoid nodular transformation or an atypical hamartoma.  None of the areas of the liver or spleen are expected to be cancer. Patient was referred to general surgery and did not receive answers about what was found or future needs.  She will follow up with her PCP.   Monthly menses and wants continue with POPs.  She accepts STD screening.   Lost both grandmothers this year.   PCP: Micheline Rough, MD  Patient's last menstrual period was 09/17/2020 (exact date).           Sexually active: Yes.    The current method of family planning is oral progesterone-only contraceptive.    Exercising: Yes.    walks daily Smoker:  no  Health Maintenance: Pap: 10-28-19 Neg:Pos HR HPV, 10-31-18 Neg:Pos HR HPV;Pos 16 genotype, 12-09-15 Neg:Neg HR HPV History of abnormal Pap:  Yes, 11-28-18 colpo showed LGSIL of cx and Neg ECC; 10-31-18 Neg:Pos HR HPV;Pos subtype 16. Hx HPV on prior pap in 2014. MMG: 12-06-19 3D/Neg/BiRads1 Colonoscopy: 06-19-20 hemorrhoids;next 10 years. BMD:   n/a  Result  n/a TDaP:  2014 Gardasil:   yes HIV:10-01-19 NR Hep C: 10-01-19 Neg Screening Labs:  PCP.   reports that she has never smoked. She has never used smokeless tobacco. She reports current alcohol use. She reports that she does not use drugs.  Past Medical History:  Diagnosis Date  . Abnormal Pap smear of cervix - hpv per report in 2014, seeing gyn 11/16/2013   normal pap with gyn 2015  . Anemia   . Arthritis    hand  . CIN I (cervical intraepithelial neoplasia I) 2020  . Diabetes mellitus without complication ([place])   . Family history of bladder cancer   . Family history of breast cancer   . Family history of kidney cancer   . Family  history of ovarian cancer   . Family history of prostate cancer   . Foreign body of hand, right 07/2011   right MP  . GERD (gastroesophageal reflux disease)   . Headache(784.0)    sinus  . HSV-1 (herpes simplex virus 1) infection   . Hyperlipidemia   . Hypertension    denies HTN, but takes Hyzaar   . Kidney stones   . Papanicolaou smear of cervix with positive high risk human papilloma virus (HPV) test 2021   Needs cervical cancer screenig in 2022  . Stenosing tenosynovitis of wrist 07/2011   right    Past Surgical History:  Procedure Laterality Date  . CYST EXCISION     right thumb  . DORSAL COMPARTMENT RELEASE  08/26/2011   Procedure: RELEASE DORSAL COMPARTMENT ([place]);  Surgeon: Cammie Sickle., MD;  Location: [facility];  Service: Orthopedics;  Laterality: Right;  . FOREIGN BODY REMOVAL  08/26/2011   Procedure: FOREIGN BODY REMOVAL ADULT;  Surgeon: Cammie Sickle., MD;  Location: [facility];  Service: Orthopedics;  Laterality: Right;    Current Outpatient Medications  Medication Sig Dispense Refill  . acetaminophen (TYLENOL) 650 MG CR tablet Take 650 mg by mouth every 8 (eight) hours as needed for pain.    . blood glucose meter kit and supplies KIT Dispense  based on patient and insurance preference. Use up to three times daily as directed. (FOR ICD-9 250.00, 250.01). 1 each 0  . CINNAMON PO Take by mouth.    . ELDERBERRY PO Take by mouth.    . ferrous sulfate 325 (65 FE) MG EC tablet Take 325 mg by mouth daily.    . fluticasone (FLONASE) 50 MCG/ACT nasal spray Place into both nostrils daily.    . hydrochlorothiazide (HYDRODIURIL) 25 MG tablet TAKE ONE TABLET BY MOUTH DAILY 30 tablet 1  . IBUPROFEN PO Take by mouth as needed. Knee pain    . JARDIANCE 10 MG TABS tablet TAKE 1 TABLET BY MOUTH DAILY BEFORE BREAKFAST 30 tablet 1  . Lancets (ONETOUCH DELICA PLUS [id number]) MISC USE AS DIRECTED TO TEST BLOOD SUGAR 1-3 TIMES PER DAY 100 each 2   . losartan (COZAAR) 50 MG tablet TAKE ONE TABLET BY MOUTH DAILY 30 tablet 1  . metFORMIN (GLUCOPHAGE) 500 MG tablet TAKE ONE TABLET BY MOUTH TWICE A DAY WITH MEALS 60 tablet 2  . mometasone (ELOCON) 0.1 % cream Apply 1 application topically daily. 45 g 1  . Multiple Vitamins-Minerals (ZINC PO) Take by mouth.    Marland Kitchen omeprazole (PRILOSEC) 20 MG capsule TAKE ONE CAPSULE BY MOUTH DAILY 30 capsule 3  . OVER THE COUNTER MEDICATION Beet root    . potassium chloride (KLOR-CON) 10 MEQ tablet TAKE TWO TABLETS BY MOUTH DAILY FOR 3 DAYS, THEN ONE TABLET BY MOUTH DAILY THEREAFTER 30 tablet 3  . SALINE NASAL SPRAY NA Place into the nose as needed.    Marland Kitchen tiZANidine (ZANAFLEX) 4 MG tablet     . triamcinolone cream (KENALOG) 0.1 % Apply topically daily. 30 g 1  . TURMERIC CURCUMIN PO Take by mouth.    . valACYclovir (VALTREX) 500 MG tablet TAKE ONE TABLET BY MOUTH DAILY 30 tablet 1  . zinc gluconate 50 MG tablet Take 50 mg by mouth daily.    . norethindrone (NORLYDA) 0.35 MG tablet Take 1 tablet (0.35 mg total) by mouth daily. 84 tablet 3   No current facility-administered medications for this visit.    Family History  Problem Relation Age of Onset  . Hyperlipidemia Mother   . Hypertension Mother   . Hypertension Father   . Stroke Father   . Breast cancer Paternal Grandmother        Age 27s or 47s  . Diabetes Mellitus II Paternal Grandmother   . Dementia Paternal Grandmother   . Stroke Paternal Grandfather   . Hypertension Sister   . Diabetes Sister   . Arthritis Maternal Grandmother   . Heart failure Maternal Grandfather   . Diabetes Maternal Grandfather   . Cancer Paternal Aunt        bladder or kidney cancer, Age 47/58   . Colon cancer Other        maternal great-aunt, in her 24s  . Ovarian cancer Cousin 35       maternal uncle's daughter  . Ovarian cancer Other        matrenal great-aunt, in her 65s  . Esophageal cancer Neg Hx   . Stomach cancer Neg Hx   . Rectal cancer Neg Hx      Review of Systems  All other systems reviewed and are negative.   Exam:   BP 128/70 (Cuff Size: Large)   Pulse (!) 102   Ht '5\' 5"'  (1.651 m)   Wt 205 lb (93 kg)   LMP 09/17/2020 (Exact Date)  SpO2 98%   BMI 34.11 kg/m     General appearance: alert, cooperative and appears stated age Head: normocephalic, without obvious abnormality, atraumatic Neck: no adenopathy, supple, symmetrical, trachea midline and thyroid normal to inspection and palpation Lungs: clear to auscultation bilaterally Breasts: normal appearance, no masses or tenderness, No nipple retraction or dimpling, No nipple discharge or bleeding, No axillary adenopathy Heart: regular rate and rhythm Abdomen: soft, non-tender; no masses, no organomegaly Extremities: extremities normal, atraumatic, no cyanosis or edema Skin: skin color, texture, turgor normal. No rashes or lesions Lymph nodes: cervical, supraclavicular, and axillary nodes normal. Neurologic: grossly normal  Pelvic: External genitalia:  no lesions              No abnormal inguinal nodes palpated.              Urethra:  normal appearing urethra with no masses, tenderness or lesions              Bartholins and Skenes: normal                 Vagina: normal appearing vagina with normal color and discharge, no lesions              Cervix: no lesions              Pap taken: Yes.   Bimanual Exam:  Uterus:  normal size, contour, position, consistency, mobility, non-tender              Adnexa: no mass, fullness, tenderness              Rectal exam: Yes.  .  Confirms.              Anus:  normal sphincter tone, no lesions  Chaperone was present for exam.  Assessment:   Well woman visit with normal exam. Hx positive LGSIL and positive HR HPV.  Hx HSV 1. DM. FH cancers.  Negative genetic testing.   Variants of unknown significance noted.  Bereavement.   Plan: Mammogram screening discussed. Self breast awareness reviewed. Pap and HR HPV as  above. Guidelines for Calcium, Vitamin D, regular exercise program including cardiovascular and weight bearing exercise. STD screening.  Refill of progesterone only birth control pills for one year. Has refills of Valtrex from PCP. Labs with PCP. Support given for loss of family members. Follow up annually and prn.

## 2020-10-01 ENCOUNTER — Ambulatory Visit (INDEPENDENT_AMBULATORY_CARE_PROVIDER_SITE_OTHER): Payer: 59 | Admitting: Obstetrics and Gynecology

## 2020-10-01 ENCOUNTER — Other Ambulatory Visit: Payer: Self-pay

## 2020-10-01 ENCOUNTER — Other Ambulatory Visit (HOSPITAL_COMMUNITY)
Admission: RE | Admit: 2020-10-01 | Discharge: 2020-10-01 | Disposition: A | Discharge status: Home / Self Care | Payer: 59 | Source: Ambulatory Visit | Attending: Obstetrics and Gynecology | Admitting: Obstetrics and Gynecology

## 2020-10-01 ENCOUNTER — Encounter: Payer: Self-pay | Admitting: Obstetrics and Gynecology

## 2020-10-01 VITALS — BP 128/70 | HR 102 | Ht 65.0 in | Wt 205.0 lb

## 2020-10-01 DIAGNOSIS — Z113 Encounter for screening for infections with a predominantly sexual mode of transmission: Secondary | ICD-10-CM | POA: Insufficient documentation

## 2020-10-01 DIAGNOSIS — Z01419 Encounter for gynecological examination (general) (routine) without abnormal findings: Secondary | ICD-10-CM

## 2020-10-01 MED ORDER — NORETHINDRONE 0.35 MG PO TABS: 1.0000 | ORAL_TABLET | Freq: Every day | ORAL | 3 refills | Status: DC

## 2020-10-02 LAB — HEPATITIS B SURFACE ANTIGEN: Hepatitis B Surface Ag: NONREACTIVE

## 2020-10-02 LAB — HEPATITIS C ANTIBODY
Hepatitis C Ab: NONREACTIVE
SIGNAL TO CUT-OFF: 0 (ref <1.00)

## 2020-10-02 LAB — RPR: RPR Ser Ql: NONREACTIVE

## 2020-10-02 LAB — HIV ANTIBODY (ROUTINE TESTING W REFLEX): HIV 1&2 Ab, 4th Generation: NONREACTIVE

## 2020-10-03 LAB — CYTOLOGY - PAP
Chlamydia: NEGATIVE
Comment: NEGATIVE
Comment: NEGATIVE
Comment: NEGATIVE
Comment: NORMAL
Diagnosis: UNDETERMINED — AB
High risk HPV: POSITIVE — AB
Neisseria Gonorrhea: NEGATIVE
Trichomonas: NEGATIVE

## 2020-10-03 NOTE — Patient Instructions (Signed)

## 2020-10-06 ENCOUNTER — Other Ambulatory Visit (INDEPENDENT_AMBULATORY_CARE_PROVIDER_SITE_OTHER): Payer: 59

## 2020-10-06 ENCOUNTER — Other Ambulatory Visit: Payer: Self-pay

## 2020-10-06 DIAGNOSIS — I152 Hypertension secondary to endocrine disorders: Secondary | ICD-10-CM

## 2020-10-06 DIAGNOSIS — Z13220 Encounter for screening for lipoid disorders: Secondary | ICD-10-CM

## 2020-10-06 DIAGNOSIS — E119 Type 2 diabetes mellitus without complications: Secondary | ICD-10-CM

## 2020-10-06 DIAGNOSIS — E1159 Type 2 diabetes mellitus with other circulatory complications: Secondary | ICD-10-CM | POA: Diagnosis not present

## 2020-10-06 LAB — CBC WITH DIFFERENTIAL/PLATELET
Basophils Absolute: 0 10*3/uL (ref 0.0–0.1)
Basophils Relative: 0.5 % (ref 0.0–3.0)
Eosinophils Absolute: 0.1 10*3/uL (ref 0.0–0.7)
Eosinophils Relative: 1.6 % (ref 0.0–5.0)
HCT: 42.6 % (ref 36.0–46.0)
Hemoglobin: 14.2 g/dL (ref 12.0–15.0)
Lymphocytes Relative: 31.6 % (ref 12.0–46.0)
Lymphs Abs: 1.9 10*3/uL (ref 0.7–4.0)
MCHC: 33.4 g/dL (ref 30.0–36.0)
MCV: 85.9 fl (ref 78.0–100.0)
Monocytes Absolute: 0.5 10*3/uL (ref 0.1–1.0)
Monocytes Relative: 8.9 % (ref 3.0–12.0)
Neutro Abs: 3.5 10*3/uL (ref 1.4–7.7)
Neutrophils Relative %: 57.4 % (ref 43.0–77.0)
Platelets: 284 10*3/uL (ref 150.0–400.0)
RBC: 4.96 Mil/uL (ref 3.87–5.11)
RDW: 14 % (ref 11.5–15.5)
WBC: 6.1 10*3/uL (ref 4.0–10.5)

## 2020-10-06 LAB — COMPREHENSIVE METABOLIC PANEL
ALT: 9 U/L (ref 0–35)
AST: 13 U/L (ref 0–37)
Albumin: 4.5 g/dL (ref 3.5–5.2)
Alkaline Phosphatase: 85 U/L (ref 39–117)
BUN: 16 mg/dL (ref 6–23)
CO2: 27 mEq/L (ref 19–32)
Calcium: 10 mg/dL (ref 8.4–10.5)
Chloride: 99 mEq/L (ref 96–112)
Creatinine, Ser: 0.92 mg/dL (ref 0.40–1.20)
GFR: 74.85 mL/min (ref >60.00)
Glucose, Bld: 137 mg/dL — ABNORMAL HIGH (ref 70–99)
Potassium: 3.7 mEq/L (ref 3.5–5.1)
Sodium: 137 mEq/L (ref 135–145)
Total Bilirubin: 0.9 mg/dL (ref 0.2–1.2)
Total Protein: 7.2 g/dL (ref 6.0–8.3)

## 2020-10-06 LAB — LIPID PANEL
Cholesterol: 191 mg/dL (ref 0–200)
HDL: 59.9 mg/dL (ref >39.00)
LDL Cholesterol: 111 mg/dL — ABNORMAL HIGH (ref 0–99)
NonHDL: 130.65
Total CHOL/HDL Ratio: 3
Triglycerides: 97 mg/dL (ref 0.0–149.0)
VLDL: 19.4 mg/dL (ref 0.0–40.0)

## 2020-10-06 LAB — MICROALBUMIN / CREATININE URINE RATIO
Creatinine,U: 51.9 mg/dL
Microalb Creat Ratio: 1.3 mg/g (ref 0.0–30.0)
Microalb, Ur: <0.7 mg/dL (ref 0.0–1.9)

## 2020-10-06 LAB — HEMOGLOBIN A1C: Hgb A1c MFr Bld: 6.8 % — ABNORMAL HIGH (ref 4.6–6.5)

## 2020-10-16 ENCOUNTER — Other Ambulatory Visit: Payer: Self-pay | Admitting: Family Medicine

## 2020-10-16 MED ORDER — PRAVASTATIN SODIUM 20 MG PO TABS: 20.0000 mg | ORAL_TABLET | Freq: Every day | ORAL | 1 refills | Status: DC

## 2020-10-24 ENCOUNTER — Other Ambulatory Visit: Payer: Self-pay | Admitting: Family Medicine

## 2020-10-24 DIAGNOSIS — E119 Type 2 diabetes mellitus without complications: Secondary | ICD-10-CM

## 2020-10-25 ENCOUNTER — Other Ambulatory Visit: Payer: Self-pay | Admitting: Family Medicine

## 2020-11-07 ENCOUNTER — Other Ambulatory Visit: Payer: Self-pay

## 2020-11-10 ENCOUNTER — Other Ambulatory Visit: Payer: Self-pay

## 2020-11-10 ENCOUNTER — Encounter: Payer: Self-pay | Admitting: Family Medicine

## 2020-11-10 ENCOUNTER — Ambulatory Visit: Payer: 59 | Admitting: Family Medicine

## 2020-11-10 VITALS — BP 128/82 | HR 97 | Temp 97.9°F | Ht 65.0 in | Wt 206.9 lb

## 2020-11-10 DIAGNOSIS — D7389 Other diseases of spleen: Secondary | ICD-10-CM | POA: Diagnosis not present

## 2020-11-10 DIAGNOSIS — E1159 Type 2 diabetes mellitus with other circulatory complications: Secondary | ICD-10-CM

## 2020-11-10 DIAGNOSIS — I152 Hypertension secondary to endocrine disorders: Secondary | ICD-10-CM

## 2020-11-10 DIAGNOSIS — E1169 Type 2 diabetes mellitus with other specified complication: Secondary | ICD-10-CM | POA: Diagnosis not present

## 2020-11-10 DIAGNOSIS — Z13220 Encounter for screening for lipoid disorders: Secondary | ICD-10-CM

## 2020-11-10 DIAGNOSIS — K769 Liver disease, unspecified: Secondary | ICD-10-CM | POA: Diagnosis not present

## 2020-11-10 NOTE — Progress Notes (Signed)
Linda Conrad DOB: 12/23/1975 Encounter date: 11/10/2020  This is a 45 y.o. female who presents with Chief Complaint  Patient presents with   Follow-up     History of present illness: Due for repeat MRI in July for follow up on 2cm splenic lesion. Also had hepatic cysts and hemangiomas right hepatic lobe.  Just lost second grandmother. This has been difficult.   HTN: hctz 52m, losartan 584m bp has been well controlled.  DMII: jardiance 1056mmetformin 500m18mD Back pain discussed at last visit: states that she is not having this anymore.   Last visit was 05/2020. Had normal colonoscopy in February 2022.  *Pap HPV high risk positive and ASCUS; suggested colposcopy with Dr. SilvQuincy Simmondst looks like their office could not contact her. She is aware and she has been trying to contact them and she will stop there when she leaves and set up colposcopy. Had breast discomfort after covid booster in November and had some aching in lymph nodes right axilla for some time. Discomfort has resolved. Has mammogram next month.    Allergies  Allergen Reactions   Codeine Other (See Comments)    Family hx of allergy Family hx of allergy   Oak Bark [Quercus Robur]     Oak trees   Soap Rash    Everything except Dove   Current Meds  Medication Sig   acetaminophen (TYLENOL) 650 MG CR tablet Take 650 mg by mouth every 8 (eight) hours as needed for pain.   blood glucose meter kit and supplies KIT Dispense based on patient and insurance preference. Use up to three times daily as directed. (FOR ICD-9 250.00, 250.01).   CINNAMON PO Take by mouth.   ELDERBERRY PO Take by mouth.   ferrous sulfate 325 (65 FE) MG EC tablet Take 325 mg by mouth daily.   fluticasone (FLONASE) 50 MCG/ACT nasal spray Place into both nostrils daily.   hydrochlorothiazide (HYDRODIURIL) 25 MG tablet TAKE ONE TABLET BY MOUTH DAILY   IBUPROFEN PO Take by mouth as needed. Knee pain   JARDIANCE 10 MG TABS tablet TAKE 1 TABLET BY  MOUTH DAILY BEFORE BREAKFAST   Lancets (ONETOUCH DELICA PLUS [id number] USE AS DIRECTED TO TEST BLOOD SUGAR 1-3 TIMES PER DAY   losartan (COZAAR) 50 MG tablet TAKE ONE TABLET BY MOUTH DAILY   metFORMIN (GLUCOPHAGE) 500 MG tablet TAKE ONE TABLET BY MOUTH TWICE A DAY WITH MEALS (Patient taking differently: Take 500 mg by mouth daily with breakfast.)   mometasone (ELOCON) 0.1 % cream Apply 1 application topically daily.   Multiple Vitamins-Minerals (ZINC PO) Take by mouth.   norethindrone (NORLYDA) 0.35 MG tablet Take 1 tablet (0.35 mg total) by mouth daily.   omeprazole (PRILOSEC) 20 MG capsule TAKE ONE CAPSULE BY MOUTH DAILY   OVER THE COUNTER MEDICATION Beet root   potassium chloride (KLOR-CON) 10 MEQ tablet TAKE TWO TABLETS BY MOUTH DAILY FOR 3 DAYS, THEN ONE TABLET BY MOUTH DAILY THEREAFTER (Patient taking differently: Take 10 mEq by mouth daily. TAKE TWO TABLETS BY MOUTH DAILY FOR 3 DAYS, THEN ONE TABLET BY MOUTH DAILY THEREAFTER)   pravastatin (PRAVACHOL) 20 MG tablet Take 1 tablet (20 mg total) by mouth daily.   SALINE NASAL SPRAY NA Place into the nose as needed.   triamcinolone cream (KENALOG) 0.1 % Apply topically daily.   TURMERIC CURCUMIN PO Take by mouth.   valACYclovir (VALTREX) 500 MG tablet TAKE ONE TABLET BY MOUTH DAILY   zinc gluconate 50 MG  tablet Take 50 mg by mouth daily.    Review of Systems  Constitutional:  Negative for chills, fatigue and fever.  Respiratory:  Negative for cough, chest tightness, shortness of breath and wheezing.   Cardiovascular:  Negative for chest pain, palpitations and leg swelling.   Objective:  BP 128/82 (BP Location: Left Arm, Patient Position: Sitting, Cuff Size: Large)   Pulse 97   Temp 97.9 F (36.6 C) (Oral)   Ht '5\' 5"'  (1.651 m)   Wt 206 lb 14.4 oz (93.8 kg)   SpO2 97%   BMI 34.43 kg/m   Weight: 206 lb 14.4 oz (93.8 kg)   BP Readings from Last 3 Encounters:  11/10/20 128/82  10/01/20 128/70  06/19/20 119/81   Wt Readings  from Last 3 Encounters:  11/10/20 206 lb 14.4 oz (93.8 kg)  10/01/20 205 lb (93 kg)  06/19/20 201 lb (91.2 kg)    Physical Exam Constitutional:      General: She is not in acute distress.    Appearance: She is well-developed.  Cardiovascular:     Rate and Rhythm: Normal rate and regular rhythm.     Heart sounds: Normal heart sounds. No murmur heard.   No friction rub.  Pulmonary:     Effort: Pulmonary effort is normal. No respiratory distress.     Breath sounds: Normal breath sounds. No wheezing or rales.  Musculoskeletal:     Right lower leg: No edema.     Left lower leg: No edema.  Neurological:     Mental Status: She is alert and oriented to person, place, and time.  Psychiatric:        Behavior: Behavior normal.    Assessment/Plan 1. Hypertension associated with diabetes ([place]) Well controlled. Continue hctz77m, losartan 561m  - CBC with Differential/Platelet; Future - Comprehensive metabolic panel; Future  2. Type 2 diabetes mellitus with other specified complication, without long-term current use of insulin (H[facility]) We will check bloodwork prior to next visit. She has done well with sugar control on jardiance 1047mmetformin 500m28md.  - Hemoglobin A1c; Future  3. Liver cysts Follow up repeat MRI abd July. - MR Abdomen W Wo Contrast; Future  4. Splenic lesion Repeat MRI July. Suspected benign lesion from prior imaging.  - MR Abdomen W Wo Contrast; Future  5. Lipid screening Continue with pravastatin 20mg23mly. Recheck lipids.  - Lipid panel; Future    Return in about 6 months (around 05/12/2021) for physical exam with bloodwork before. We reviewed recently completed bloodwork; she is going to get back on track with healthier eating, regular exercise. Suspect cholesterol will improve with this. She prefers to try this before adding/changing medication.     JunelMicheline Rough

## 2020-11-12 ENCOUNTER — Other Ambulatory Visit: Payer: Self-pay | Admitting: Family Medicine

## 2020-11-12 NOTE — Progress Notes (Signed)
  Subjective:     Patient ID: Linda Conrad, female   DOB: [birth year], 45 y.o.   MRN: [id number]  HPI Patient here today for colposcopy with pap 10-01-20 showing ASCUS:Pos HR HPV.  PAP HISTORY: 10-01-20 ASCUS:Pos HR HPV 10-28-19 Neg:Pos HR HPV 11-28-18 colpo showed LGSIL of cx and Neg ECC 10-31-18 Neg:Pos HR HPV;Pos subtype 16 Hx HPV on prior pap in 2014.  Review of Systems  All other systems reviewed and are negative. LMP: 11-07-20 thru 11-11-20 Contraception: POP UPT: unable to void  She received her Gardasil vaccine.      Objective:   Physical Exam Colposcopy of vagina, cervix, and vulva. Consent for colposcopy. Acetic acid used on the vagina and on vulva.  Colposcopy satisfactory.  No lesions noted of the cervix, vagina, vulva, or perianal region.  ECC taken and sent to pathology.  Minimal EBL.  No complications.      Assessment:      ASCUS, positive HR HPV.    Plan:     Follow up biopsy results.  Anticipate pap and HR HPV in one year.

## 2020-11-17 ENCOUNTER — Encounter: Payer: Self-pay | Admitting: Obstetrics and Gynecology

## 2020-11-17 ENCOUNTER — Ambulatory Visit: Payer: 59 | Admitting: Obstetrics and Gynecology

## 2020-11-17 ENCOUNTER — Other Ambulatory Visit (HOSPITAL_COMMUNITY)
Admission: RE | Admit: 2020-11-17 | Discharge: 2020-11-17 | Disposition: A | Discharge status: Home / Self Care | Payer: 59 | Source: Ambulatory Visit | Attending: Obstetrics and Gynecology | Admitting: Obstetrics and Gynecology

## 2020-11-17 ENCOUNTER — Other Ambulatory Visit: Payer: Self-pay

## 2020-11-17 VITALS — BP 134/80 | Ht 66.0 in | Wt 205.0 lb

## 2020-11-17 DIAGNOSIS — R87810 Cervical high risk human papillomavirus (HPV) DNA test positive: Secondary | ICD-10-CM

## 2020-11-17 DIAGNOSIS — R87610 Atypical squamous cells of undetermined significance on cytologic smear of cervix (ASC-US): Secondary | ICD-10-CM

## 2020-11-17 DIAGNOSIS — N870 Mild cervical dysplasia: Secondary | ICD-10-CM | POA: Diagnosis not present

## 2020-11-17 NOTE — Patient Instructions (Signed)
Colposcopy, Care After This sheet gives you information about how to care for yourself after your procedure. Your doctor may also give you more specific instructions. If youhave problems or questions, contact your doctor. What can I expect after the procedure? If you did not have a sample of your tissue taken out (did not have a biopsy), you may only have some spotting of blood for a few days. You can go back toyour normal activities. If you had a sample of your tissue taken out, it is common to have: Soreness and mild pain. These may last for a few days. A light-headed feeling. Mild bleeding or fluid (discharge) coming from your vagina. The fluid will look dark and grainy. You may have this for a few days. The fluid may be caused by a liquid that was used during your procedure. You may need to wear a sanitary pad. Spotting of blood for at least 48 hours after the procedure. Follow these instructions at home: Medicines Take over-the-counter and prescription medicines only as told by your doctor. Ask your doctor what medicines you can start taking again. This is very important if you take blood thinners. Activity Limit your activity for the first day after your procedure as told by your doctor. For at least 3 days, or for as long as told by your doctor, avoid: Douching. Using tampons. Having sex. Return to your normal activities as told by your doctor. Ask your doctor what activities are safe for you. General instructions  Drink enough fluid to keep your pee (urine) pale yellow. Ask your doctor if you may take baths, swim, or use a hot tub. You may take showers. If you use birth control (contraception), keep using it. Keep all follow-up visits as told by your doctor. This is important.  Contact a doctor if: You get a skin rash. Get help right away if: You bleed a lot from your vagina. A lot of bleeding means you use more than one pad an hour for 2 hours in a row. You have clumps of  blood (blood clots) coming from your vagina. You have a fever or chills. You have signs of infection. This may be fluid coming from your vagina that is: Different than normal. Yellow. Bad-smelling. You have very bad pain or cramps in your lower belly that do not get better with medicine. You faint. Summary If you did not have a sample of your tissue taken out, you may only have some spotting of blood for a few days. You can go back to your normal activities. If you had a sample of your tissue taken out, it is common to have mild pain for a few days and spotting for 48 hours. Avoid douching, using tampons, and having sex for at least 3 days after the procedure or for as long as told. Get help right away if you have a lot of bleeding, very bad pain, or signs of infection. This information is not intended to replace advice given to you by your health care provider. Make sure you discuss any questions you have with your healthcare provider. Document Revised: 03/18/2020 Document Reviewed: 05/16/2019 Elsevier Patient Education  2022 [employer]vier Inc.  

## 2020-11-19 LAB — SURGICAL PATHOLOGY

## 2020-11-29 ENCOUNTER — Ambulatory Visit
Admission: RE | Admit: 2020-11-29 | Discharge: 2020-11-29 | Disposition: A | Discharge status: Home / Self Care | Payer: 59 | Source: Ambulatory Visit | Attending: Family Medicine | Admitting: Family Medicine

## 2020-11-29 ENCOUNTER — Other Ambulatory Visit: Payer: Self-pay

## 2020-11-29 DIAGNOSIS — D7389 Other diseases of spleen: Secondary | ICD-10-CM

## 2020-11-29 DIAGNOSIS — K769 Liver disease, unspecified: Secondary | ICD-10-CM

## 2020-11-29 MED ORDER — GADOBENATE DIMEGLUMINE 529 MG/ML IV SOLN
19.0000 mL | Freq: Once | INTRAVENOUS | Status: AC | PRN
  Administered 2020-11-29: 19 mL via INTRAVENOUS

## 2020-12-03 ENCOUNTER — Other Ambulatory Visit: Payer: Self-pay | Admitting: *Deleted

## 2020-12-03 DIAGNOSIS — D7389 Other diseases of spleen: Secondary | ICD-10-CM

## 2020-12-26 ENCOUNTER — Ambulatory Visit: Payer: 59

## 2020-12-30 ENCOUNTER — Telehealth: Payer: Self-pay | Admitting: *Deleted

## 2020-12-30 NOTE — Telephone Encounter (Signed)
Linda Conrad faxed a refill request for Losartan potassium '50mg'$  and HCTZ '25mg'$ .  Message sent to PCP as last Rxs were filled by [facility].

## 2020-12-31 ENCOUNTER — Ambulatory Visit
Admission: RE | Admit: 2020-12-31 | Discharge: 2020-12-31 | Disposition: A | Discharge status: Home / Self Care | Payer: 59 | Source: Ambulatory Visit | Attending: Family Medicine | Admitting: Family Medicine

## 2020-12-31 ENCOUNTER — Other Ambulatory Visit: Payer: Self-pay

## 2020-12-31 DIAGNOSIS — Z1231 Encounter for screening mammogram for malignant neoplasm of breast: Secondary | ICD-10-CM

## 2020-12-31 MED ORDER — HYDROCHLOROTHIAZIDE 25 MG PO TABS: 25.0000 mg | ORAL_TABLET | Freq: Every day | ORAL | 5 refills | Status: DC

## 2020-12-31 MED ORDER — LOSARTAN POTASSIUM 50 MG PO TABS: 50.0000 mg | ORAL_TABLET | Freq: Every day | ORAL | 5 refills | Status: DC

## 2020-12-31 NOTE — Telephone Encounter (Signed)
Rx done. 

## 2020-12-31 NOTE — Addendum Note (Signed)
Addended by: Agnes Lawrence on: 12/31/2020 03:08 PM   Modules accepted: Orders

## 2020-12-31 NOTE — Telephone Encounter (Signed)
[facility] for 90 day of both

## 2021-03-12 ENCOUNTER — Other Ambulatory Visit: Payer: Self-pay | Admitting: Family Medicine

## 2021-04-22 ENCOUNTER — Ambulatory Visit (INDEPENDENT_AMBULATORY_CARE_PROVIDER_SITE_OTHER): Payer: 59

## 2021-04-22 DIAGNOSIS — Z23 Encounter for immunization: Secondary | ICD-10-CM | POA: Diagnosis not present

## 2021-05-13 ENCOUNTER — Ambulatory Visit (INDEPENDENT_AMBULATORY_CARE_PROVIDER_SITE_OTHER): Payer: 59 | Admitting: Family Medicine

## 2021-05-13 ENCOUNTER — Telehealth: Payer: Self-pay | Admitting: *Deleted

## 2021-05-13 ENCOUNTER — Encounter: Payer: Self-pay | Admitting: Family Medicine

## 2021-05-13 VITALS — BP 108/72 | HR 92 | Temp 98.0°F | Ht 66.25 in | Wt 205.0 lb

## 2021-05-13 DIAGNOSIS — E1159 Type 2 diabetes mellitus with other circulatory complications: Secondary | ICD-10-CM | POA: Diagnosis not present

## 2021-05-13 DIAGNOSIS — I152 Hypertension secondary to endocrine disorders: Secondary | ICD-10-CM

## 2021-05-13 DIAGNOSIS — E559 Vitamin D deficiency, unspecified: Secondary | ICD-10-CM | POA: Diagnosis not present

## 2021-05-13 DIAGNOSIS — Z13220 Encounter for screening for lipoid disorders: Secondary | ICD-10-CM

## 2021-05-13 DIAGNOSIS — R5383 Other fatigue: Secondary | ICD-10-CM

## 2021-05-13 DIAGNOSIS — Z8639 Personal history of other endocrine, nutritional and metabolic disease: Secondary | ICD-10-CM

## 2021-05-13 DIAGNOSIS — D649 Anemia, unspecified: Secondary | ICD-10-CM

## 2021-05-13 DIAGNOSIS — E1169 Type 2 diabetes mellitus with other specified complication: Secondary | ICD-10-CM

## 2021-05-13 DIAGNOSIS — Z0000 Encounter for general adult medical examination without abnormal findings: Secondary | ICD-10-CM

## 2021-05-13 DIAGNOSIS — K219 Gastro-esophageal reflux disease without esophagitis: Secondary | ICD-10-CM

## 2021-05-13 DIAGNOSIS — E611 Iron deficiency: Secondary | ICD-10-CM

## 2021-05-13 LAB — COMPREHENSIVE METABOLIC PANEL
ALT: 13 U/L (ref 0–35)
AST: 17 U/L (ref 0–37)
Albumin: 4.4 g/dL (ref 3.5–5.2)
Alkaline Phosphatase: 93 U/L (ref 39–117)
BUN: 15 mg/dL (ref 6–23)
CO2: 30 mEq/L (ref 19–32)
Calcium: 10 mg/dL (ref 8.4–10.5)
Chloride: 96 mEq/L (ref 96–112)
Creatinine, Ser: 0.91 mg/dL (ref 0.40–1.20)
GFR: 75.52 mL/min (ref >60.00)
Glucose, Bld: 158 mg/dL — ABNORMAL HIGH (ref 70–99)
Potassium: 3.3 mEq/L — ABNORMAL LOW (ref 3.5–5.1)
Sodium: 136 mEq/L (ref 135–145)
Total Bilirubin: 0.8 mg/dL (ref 0.2–1.2)
Total Protein: 7.8 g/dL (ref 6.0–8.3)

## 2021-05-13 LAB — VITAMIN D 25 HYDROXY (VIT D DEFICIENCY, FRACTURES): VITD: 49.8 ng/mL (ref 30.00–100.00)

## 2021-05-13 LAB — IBC + FERRITIN
Ferritin: 57.3 ng/mL (ref 10.0–291.0)
Iron: 73 ug/dL (ref 42–145)
Saturation Ratios: 18.4 % — ABNORMAL LOW (ref 20.0–50.0)
TIBC: 396.2 ug/dL (ref 250.0–450.0)
Transferrin: 283 mg/dL (ref 212.0–360.0)

## 2021-05-13 LAB — CBC WITH DIFFERENTIAL/PLATELET
Basophils Absolute: 0 10*3/uL (ref 0.0–0.1)
Basophils Relative: 0.7 % (ref 0.0–3.0)
Eosinophils Absolute: 0.1 10*3/uL (ref 0.0–0.7)
Eosinophils Relative: 1.2 % (ref 0.0–5.0)
HCT: 43.5 % (ref 36.0–46.0)
Hemoglobin: 14.1 g/dL (ref 12.0–15.0)
Lymphocytes Relative: 33.5 % (ref 12.0–46.0)
Lymphs Abs: 1.8 10*3/uL (ref 0.7–4.0)
MCHC: 32.3 g/dL (ref 30.0–36.0)
MCV: 86.3 fl (ref 78.0–100.0)
Monocytes Absolute: 0.5 10*3/uL (ref 0.1–1.0)
Monocytes Relative: 9.4 % (ref 3.0–12.0)
Neutro Abs: 3 10*3/uL (ref 1.4–7.7)
Neutrophils Relative %: 55.2 % (ref 43.0–77.0)
Platelets: 300 10*3/uL (ref 150.0–400.0)
RBC: 5.04 Mil/uL (ref 3.87–5.11)
RDW: 13.4 % (ref 11.5–15.5)
WBC: 5.4 10*3/uL (ref 4.0–10.5)

## 2021-05-13 LAB — LIPID PANEL
Cholesterol: 155 mg/dL (ref 0–200)
HDL: 61.5 mg/dL (ref >39.00)
LDL Cholesterol: 76 mg/dL (ref 0–99)
NonHDL: 93.87
Total CHOL/HDL Ratio: 3
Triglycerides: 88 mg/dL (ref 0.0–149.0)
VLDL: 17.6 mg/dL (ref 0.0–40.0)

## 2021-05-13 LAB — TSH: TSH: 0.82 u[IU]/mL (ref 0.35–5.50)

## 2021-05-13 LAB — HEMOGLOBIN A1C: Hgb A1c MFr Bld: 10 % — ABNORMAL HIGH (ref 4.6–6.5)

## 2021-05-13 LAB — FOLATE: Folate: 22.6 ng/mL (ref >5.9)

## 2021-05-13 LAB — VITAMIN B12: Vitamin B-12: 801 pg/mL (ref 211–911)

## 2021-05-13 MED ORDER — EMPAGLIFLOZIN 25 MG PO TABS: 25.0000 mg | ORAL_TABLET | Freq: Every day | ORAL | 1 refills | Status: DC

## 2021-05-13 MED ORDER — LOSARTAN POTASSIUM-HCTZ 50-12.5 MG PO TABS
1.0000 | ORAL_TABLET | Freq: Every day | ORAL | 1 refills | Status: DC
Start: 2021-05-13 — End: 2021-12-10

## 2021-05-13 NOTE — Telephone Encounter (Signed)
Spoke with Bethena Roys the surgical scheduler (ph#[phone]) and she stated she has not been able to reach the patient yet, the patient has not been scheduled for surgery and she generally does not send this request for a knee scope.  Our fax number was given to Bethena Roys and she stated once she reaches the patient and has a surgery date, she will fax a clearance form.  Message sent to PCP.

## 2021-05-13 NOTE — Progress Notes (Signed)
Kelton Pillar DOB: 05-Jul-1975 Encounter date: 05/13/2021  This is a 45 y.o. female who presents for complete physical   History of present illness/Additional concerns:  This weekend felt like she had episode where iron was low. Felt very tired, no energy. Started taking iron supplement and feels like she is doing better. Doesn't feel like it was sugar; these have been good.   She is planning to get back to [place] to exercise regularly.   Has been seeing ortho for right knee. Had large effusion drained, they are planning on getting scope January 11th. Dr. Berenice Primas - with [facility]. Knee does swell if up on this. Wearing good supportive shoes.   Mammogram 12/2020: normal Saw derm for moles and going back for removal.  Colonoscopy: repeat 05/2030.   Seeing Dr. Quincy Simmonds for gyn care.   HTN: hctz 66m, losartan 536mHL: pravastatin 2064maily DMII: jardiance 25m28metformin 500mg40me daily. Does get looser stools with the metformin.   Past Medical History:  Diagnosis Date   Abnormal Pap smear of cervix - hpv per report in 2014, seeing gyn 11/16/2013   normal pap with gyn 2015   Anemia    Arthritis    hand   CIN I (cervical intraepithelial neoplasia I) 2020   Diabetes mellitus without complication (HCC) [place] cancer    Family history of breast cancer    Family history of kidney cancer    Family history of ovarian cancer    Family history of prostate cancer    Foreign body of hand, right 07/2011   right MP   GERD (gastroesophageal reflux disease)    Headache(784.0)    sinus   HSV-1 (herpes simplex virus 1) infection    Hyperlipidemia    Hypertension    denies HTN, but takes Hyzaar    Kidney stones    Papanicolaou smear of cervix with positive high risk human papilloma virus (HPV) test 2021   Needs cervical cancer screenig in 2022   Stenosing tenosynovitis of wrist 07/2011   right   Past Surgical History:  Procedure Laterality Date   CYST  EXCISION     right thumb   DORSAL COMPARTMENT RELEASE  08/26/2011   Procedure: RELEASE DORSAL COMPARTMENT (DEQUERVAIN);  Surgeon: RoberCammie Sickle;  [facility];  Laterality: Right;   FOREIGN BODY REMOVAL  08/26/2011   Procedure: FOREIGN BODY REMOVAL ADULT;  Surgeon: RoberCammie Sickle;  [facility];  Laterality: Right;   Allergies  Allergen Reactions   Codeine Other (See Comments)    Family hx of allergy Family hx of allergy   Oak Bark [Quercus Robur]     Oak trees   Soap Rash    Everything except Dove   Current Meds  Medication Sig   acetaminophen (TYLENOL) 650 MG CR tablet Take 650 mg by mouth every 8 (eight) hours as needed for pain.   Ascorbic Acid (VITAMIN C PO) Take 750 mg by mouth daily.   blood glucose meter kit and supplies KIT Dispense based on patient and insurance preference. Use up to three times daily as directed. (FOR ICD-9 250.00, 250.01).   CINNAMON PO Take by mouth.   ELDERBERRY PO Take by mouth.   empagliflozin (JARDIANCE) 25 MG TABS tablet Take 1 tablet (25 mg total) by mouth daily before breakfast.   ferrous sulfate 325 (65 FE) MG EC tablet Take  325 mg by mouth daily.   fluticasone (FLONASE) 50 MCG/ACT nasal spray Place into both nostrils daily.   IBUPROFEN PO Take by mouth as needed. Knee pain   Lancets (ONETOUCH DELICA PLUS [id number]) MISC USE AS DIRECTED TO TEST BLOOD SUGAR 1-3 TIMES PER DAY   losartan-hydrochlorothiazide (HYZAAR) 50-12.5 MG tablet Take 1 tablet by mouth daily.   mometasone (ELOCON) 0.1 % cream Apply 1 application topically daily.   Multiple Vitamins-Minerals (ZINC PO) Take by mouth.   norethindrone (NORLYDA) 0.35 MG tablet Take 1 tablet (0.35 mg total) by mouth daily.   omeprazole (PRILOSEC) 20 MG capsule TAKE ONE CAPSULE BY MOUTH DAILY   OVER THE COUNTER MEDICATION Beet root   OVER THE COUNTER MEDICATION Liquid IV added to water daily    potassium chloride (KLOR-CON) 10 MEQ tablet TAKE TWO TABLETS BY MOUTH DAILY FOR 3 DAYS, THEN ONE TABLET BY MOUTH DAILY THEREAFTER (Patient taking differently: Take 10 mEq by mouth daily. TAKE TWO TABLETS BY MOUTH DAILY FOR 3 DAYS, THEN ONE TABLET BY MOUTH DAILY THEREAFTER)   pravastatin (PRAVACHOL) 20 MG tablet Take 1 tablet (20 mg total) by mouth daily.   SALINE NASAL SPRAY NA Place into the nose as needed.   triamcinolone cream (KENALOG) 0.1 % Apply topically daily.   TURMERIC CURCUMIN PO Take by mouth.   valACYclovir (VALTREX) 500 MG tablet TAKE ONE TABLET BY MOUTH DAILY   zinc gluconate 50 MG tablet Take 50 mg by mouth daily.   [DISCONTINUED] hydrochlorothiazide (HYDRODIURIL) 25 MG tablet Take 1 tablet (25 mg total) by mouth daily.   [DISCONTINUED] JARDIANCE 10 MG TABS tablet TAKE 1 TABLET BY MOUTH DAILY BEFORE BREAKFAST   [DISCONTINUED] losartan (COZAAR) 50 MG tablet Take 1 tablet (50 mg total) by mouth daily.   [DISCONTINUED] metFORMIN (GLUCOPHAGE) 500 MG tablet TAKE ONE TABLET BY MOUTH TWICE A DAY WITH MEALS (Patient taking differently: Take 500 mg by mouth daily with breakfast.)   Social History   Tobacco Use   Smoking status: Never   Smokeless tobacco: Never  Substance Use Topics   Alcohol use: Yes    Alcohol/week: 0.0 standard drinks    Comment: occasionally   Family History  Problem Relation Age of Onset   Hyperlipidemia Mother    Hypertension Mother    Hypertension Father    Stroke Father    Hypertension Sister    Diabetes Sister    Arthritis Maternal Grandmother    Kidney failure Maternal Grandmother    Heart failure Maternal Grandmother    Heart failure Maternal Grandfather    Diabetes Maternal Grandfather    Breast cancer Paternal Grandmother        Age 93s or 98s   Diabetes Mellitus II Paternal Grandmother    Dementia Paternal Grandmother    Throat cancer Paternal Grandmother    Stroke Paternal Grandfather    Cancer Paternal Aunt        bladder or kidney  cancer, Age 27/58    Ovarian cancer Cousin 23       maternal uncle's daughter   Colon cancer Other        maternal great-aunt, in her 29s   Ovarian cancer Other        matrenal great-aunt, in her 19s   Esophageal cancer Neg Hx    Stomach cancer Neg Hx    Rectal cancer Neg Hx      Review of Systems  Constitutional:  Negative for activity change, appetite change, chills, fatigue, fever and unexpected weight  change.  HENT:  Negative for congestion, ear pain, hearing loss, sinus pressure, sinus pain, sore throat and trouble swallowing.   Eyes:  Negative for pain and visual disturbance.  Respiratory:  Negative for cough, chest tightness, shortness of breath and wheezing.   Cardiovascular:  Negative for chest pain, palpitations and leg swelling.  Gastrointestinal:  Negative for abdominal pain, blood in stool, constipation, diarrhea, nausea and vomiting.  Genitourinary:  Negative for difficulty urinating and menstrual problem.  Musculoskeletal:  Negative for arthralgias and back pain.  Skin:  Negative for rash.  Neurological:  Negative for dizziness, weakness, numbness and headaches.  Hematological:  Negative for adenopathy. Does not bruise/bleed easily.  Psychiatric/Behavioral:  Negative for sleep disturbance and suicidal ideas. The patient is not nervous/anxious.    CBC:  Lab Results  Component Value Date   WBC 6.1 10/06/2020   HGB 14.2 10/06/2020   HGB 12.8 05/21/2020   HCT 42.6 10/06/2020   HCT 38.9 05/21/2020   MCH 28.7 05/21/2020   MCH 28.1 02/29/2020   MCHC 33.4 10/06/2020   RDW 14.0 10/06/2020   RDW 12.8 05/21/2020   PLT 284.0 10/06/2020   PLT 303 05/21/2020   MPV 10.4 02/29/2020   CMP: Lab Results  Component Value Date   NA 137 10/06/2020   NA 138 05/21/2020   K 3.7 10/06/2020   CL 99 10/06/2020   CO2 27 10/06/2020   ANIONGAP 12 (H) 07/18/2017   GLUCOSE 137 (H) 10/06/2020   BUN 16 10/06/2020   BUN 13 05/21/2020   CREATININE 0.92 10/06/2020   CREATININE 0.92  04/07/2020   GFRAA 106 05/21/2020   GFRAA >60 07/18/2017   CALCIUM 10.0 10/06/2020   PROT 7.2 10/06/2020   BILITOT 0.9 10/06/2020   BILITOT 0.5 07/18/2017   ALKPHOS 85 10/06/2020   ALT 9 10/06/2020   ALT 8 07/18/2017   AST 13 10/06/2020   AST 15 07/18/2017   LIPID: Lab Results  Component Value Date   CHOL 191 10/06/2020   TRIG 97.0 10/06/2020   HDL 59.90 10/06/2020   LDLCALC 111 (H) 10/06/2020   LDLCALC 87 02/29/2020    Objective:  BP 108/72 (BP Location: Left Arm, Patient Position: Sitting, Cuff Size: Large)    Pulse 92    Temp 98 F (36.7 C) (Oral)    Ht 5' 6.25" (1.683 m)    Wt 205 lb (93 kg)    LMP 05/08/2021 (Exact Date)    SpO2 98%    BMI 32.84 kg/m   Weight: 205 lb (93 kg)   BP Readings from Last 3 Encounters:  05/13/21 108/72  11/17/20 134/80  11/10/20 128/82   Wt Readings from Last 3 Encounters:  05/13/21 205 lb (93 kg)  11/17/20 205 lb (93 kg)  11/10/20 206 lb 14.4 oz (93.8 kg)    Physical Exam Constitutional:      General: She is not in acute distress.    Appearance: She is well-developed.  HENT:     Head: Normocephalic and atraumatic.     Right Ear: External ear normal.     Left Ear: External ear normal.     Mouth/Throat:     Pharynx: No oropharyngeal exudate.  Eyes:     Conjunctiva/sclera: Conjunctivae normal.     Pupils: Pupils are equal, round, and reactive to light.  Neck:     Thyroid: No thyromegaly.  Cardiovascular:     Rate and Rhythm: Normal rate and regular rhythm.     Heart sounds: Normal heart sounds.  No murmur heard.   No friction rub. No gallop.  Pulmonary:     Effort: Pulmonary effort is normal.     Breath sounds: Normal breath sounds.  Abdominal:     General: Bowel sounds are normal. There is no distension.     Palpations: Abdomen is soft. There is no mass.     Tenderness: There is no abdominal tenderness. There is no guarding.     Hernia: No hernia is present.  Musculoskeletal:        General: No tenderness or deformity.  Normal range of motion.     Cervical back: Normal range of motion and neck supple.  Lymphadenopathy:     Cervical: No cervical adenopathy.  Skin:    General: Skin is warm and dry.     Findings: No rash.  Neurological:     Mental Status: She is alert and oriented to person, place, and time.     Deep Tendon Reflexes: Reflexes normal.     Reflex Scores:      Tricep reflexes are 2+ on the right side and 2+ on the left side.      Bicep reflexes are 2+ on the right side and 2+ on the left side.      Brachioradialis reflexes are 2+ on the right side and 2+ on the left side.      Patellar reflexes are 2+ on the right side and 2+ on the left side. Psychiatric:        Speech: Speech normal.        Behavior: Behavior normal.        Thought Content: Thought content normal.    Assessment/Plan: Health Maintenance Due  Topic Date Due   Pneumococcal Vaccine 61-61 Years old (2 - PCV) 01/03/2015   HEMOGLOBIN A1C  04/08/2021   Health Maintenance reviewed.  1. Preventative health care She plans to get back on track with regular exercise.   2. History of iron deficiency Rechecking levels. She is taking iron 65 elemental now.  - IBC + Ferritin; Future  3. Hypertension associated with diabetes ([place]) We are going to put on combo medication to limit pill burden. Hctz dose is a little lower, so asked her to look at home.  - losartan-hydrochlorothiazide (HYZAAR) 50-12.5 MG tablet; Take 1 tablet by mouth daily.  Dispense: 90 tablet; Refill: 1 - CBC with Differential/Platelet; Future - Comprehensive metabolic panel; Future  4. Type 2 diabetes mellitus with other specified complication, without long-term current use of insulin ([place]) She has been controlled. Stopping metformin due to loose stools. Increase jardiance to 32m.  - empagliflozin (JARDIANCE) 25 MG TABS tablet; Take 1 tablet (25 mg total) by mouth daily before breakfast.  Dispense: 90 tablet; Refill: 1 - Hemoglobin A1c; Future - HM DIABETES  FOOT EXAM  5. Gastroesophageal reflux disease, unspecified whether esophagitis present Continue with prilosec prn.   6. Anemia, unspecified type - Vitamin B12; Future - Folate; Future  7. Iron deficiency Checking bloodwork.   8. Vitamin D deficiency - VITAMIN D 25 Hydroxy (Vit-D Deficiency, Fractures); Future  9. Lipid screening - Lipid panel; Future  10. Fatigue, unspecified type - TSH; Future   Return in about 6 months (around 11/11/2021) for Chronic condition visit.  JMicheline Rough MD

## 2021-05-13 NOTE — Telephone Encounter (Signed)
-----   Message from Caren Macadam, MD sent at 05/13/2021 10:21 AM EST ----- Can you please contact dr. Berenice Primas office ([facility]) as patient states they sent over preop clearance form to sign but I haven't seen this.

## 2021-05-13 NOTE — Telephone Encounter (Signed)
Perfect. Please let patient know.

## 2021-05-14 NOTE — Telephone Encounter (Signed)
Patient informed of the message below.

## 2021-05-15 ENCOUNTER — Other Ambulatory Visit: Payer: Self-pay | Admitting: Family Medicine

## 2021-05-21 ENCOUNTER — Other Ambulatory Visit: Payer: Self-pay | Admitting: Family Medicine

## 2021-05-22 ENCOUNTER — Other Ambulatory Visit: Payer: Self-pay | Admitting: Family Medicine

## 2021-05-22 MED ORDER — RYBELSUS 3 MG PO TABS: 3.0000 mg | ORAL_TABLET | Freq: Every day | ORAL | 1 refills | Status: DC

## 2021-06-10 HISTORY — PX: KNEE ARTHROSCOPY: SUR90

## 2021-08-04 LAB — HM DIABETES EYE EXAM

## 2021-08-05 ENCOUNTER — Other Ambulatory Visit: Payer: Self-pay | Admitting: Family Medicine

## 2021-08-06 ENCOUNTER — Encounter: Payer: Self-pay | Admitting: Family Medicine

## 2021-09-01 ENCOUNTER — Other Ambulatory Visit: Payer: Self-pay | Admitting: Family Medicine

## 2021-09-14 ENCOUNTER — Other Ambulatory Visit: Payer: Self-pay | Admitting: Obstetrics and Gynecology

## 2021-09-14 NOTE — Telephone Encounter (Signed)
Last AEX 10/01/20--scheduled for 10/05/20.  ?Last mammo 12/31/20-birads 1 ?

## 2021-10-05 ENCOUNTER — Ambulatory Visit (INDEPENDENT_AMBULATORY_CARE_PROVIDER_SITE_OTHER): Payer: 59 | Admitting: Obstetrics and Gynecology

## 2021-10-05 ENCOUNTER — Other Ambulatory Visit (HOSPITAL_COMMUNITY)
Admission: RE | Admit: 2021-10-05 | Discharge: 2021-10-05 | Disposition: A | Discharge status: Home / Self Care | Payer: 59 | Source: Ambulatory Visit | Attending: Obstetrics and Gynecology | Admitting: Obstetrics and Gynecology

## 2021-10-05 ENCOUNTER — Encounter: Payer: Self-pay | Admitting: Obstetrics and Gynecology

## 2021-10-05 VITALS — BP 132/74 | HR 90 | Ht 65.5 in | Wt 207.0 lb

## 2021-10-05 DIAGNOSIS — Z113 Encounter for screening for infections with a predominantly sexual mode of transmission: Secondary | ICD-10-CM | POA: Insufficient documentation

## 2021-10-05 DIAGNOSIS — Z01419 Encounter for gynecological examination (general) (routine) without abnormal findings: Secondary | ICD-10-CM

## 2021-10-05 DIAGNOSIS — Z124 Encounter for screening for malignant neoplasm of cervix: Secondary | ICD-10-CM | POA: Insufficient documentation

## 2021-10-05 DIAGNOSIS — Z1159 Encounter for screening for other viral diseases: Secondary | ICD-10-CM | POA: Diagnosis not present

## 2021-10-05 DIAGNOSIS — Z114 Encounter for screening for human immunodeficiency virus [HIV]: Secondary | ICD-10-CM | POA: Diagnosis not present

## 2021-10-05 MED ORDER — NORETHINDRONE 0.35 MG PO TABS: ORAL_TABLET | ORAL | 3 refills | Status: DC

## 2021-10-05 NOTE — Patient Instructions (Signed)

## 2021-10-05 NOTE — Progress Notes (Signed)
46 y.o. G0P0000 Single African American female here for annual exam.   ? ?No skipped cycles.  ?No problems with her Micronor pills. ? ?Desires STD screening.  ? ?Had right knee surgery.  ?May have an ear tube placed.  ? ?Taking Caltrate once daily.  ? ?PCP:  Micheline Rough, MD  ? ?Patient's last menstrual period was 09/14/2021 (exact date).     ?Period Cycle (Days): 30 ?Period Duration (Days): 5 ?Menstrual Flow: Moderate ?Menstrual Control: Thin pad ?Dysmenorrhea: (!) Mild ?Dysmenorrhea Symptoms: Cramping ?    ?Sexually active: Yes.    ?The current method of family planning is oral progesterone-only contraceptive.    ?Exercising: Yes.     Walking, stretching, stationary bike ?Smoker:  no ? ?Health Maintenance: ?Pap:  10-01-20 ASCUS:Pos HR HPV, 10-28-19 Neg:Pos HR HPV, 10-31-18 Neg:Pos HR HPV;Pos 16 genotype ?History of abnormal Pap:  Yes, 11-17-20 Colpo revealed LSIL, 10-01-20 pap ASCUS:Pos HR HPV. 10-28-19 Neg:Pos HR HPV.  11-27-20 colpo revealed LSIL of cervix with Neg ECC;10-31-18 Neg:Pos HR HPV;Pos 16 genotype. 2014 Hx of HPV on pap. ?MMG:  12-31-20 Neg/Birads1 ?Colonoscopy:  06-19-20 normal;10 years ?BMD:   n/a  Result  n/a ?TDaP:  2014 ?Gardasil:   yes ?HIV: 10-01-20 NR ?Hep C: 10-01-20 Neg ?Screening Labs: PCP ? ? reports that she has never smoked. She has never used smokeless tobacco. She reports that she does not currently use alcohol. She reports that she does not use drugs. ? ?Past Medical History:  ?Diagnosis Date  ? Abnormal Pap smear of cervix - hpv per report in 2014, seeing gyn 11/16/2013  ? normal pap with gyn 2015  ? Anemia   ? Arthritis   ? hand  ? CIN I (cervical intraepithelial neoplasia I) 2020  ? Diabetes mellitus without complication (Home)   ? Family history of bladder cancer   ? Family history of breast cancer   ? Family history of kidney cancer   ? Family history of ovarian cancer   ? Family history of prostate cancer   ? Foreign body of hand, right 07/2011  ? right MP  ? GERD (gastroesophageal reflux  disease)   ? Headache(784.0)   ? sinus  ? HSV-1 (herpes simplex virus 1) infection   ? Hyperlipidemia   ? Hypertension   ? denies HTN, but takes Hyzaar   ? Kidney stones   ? Papanicolaou smear of cervix with positive high risk human papilloma virus (HPV) test 2021  ? Needs cervical cancer screenig in 2022  ? Stenosing tenosynovitis of wrist 07/2011  ? right  ? ? ?Past Surgical History:  ?Procedure Laterality Date  ? CYST EXCISION    ? right thumb  ? DORSAL COMPARTMENT RELEASE  08/26/2011  ? Procedure: RELEASE DORSAL COMPARTMENT (DEQUERVAIN);  Surgeon: Cammie Sickle., MD;  Location[facility];  Service: Orthopedics;  Laterality: Right;  ? FOREIGN BODY REMOVAL  08/26/2011  ? Procedure: FOREIGN BODY REMOVAL ADULT;  Surgeon: Cammie Sickle., MD;  Location: [place];  Service: Orthopedics;  Laterality: Right;  ? KNEE ARTHROSCOPY Right 06/10/2021  ? ? ?Current Outpatient Medications  ?Medication Sig Dispense Refill  ? acetaminophen (TYLENOL) 650 MG CR tablet Take 650 mg by mouth every 8 (eight) hours as needed for pain.    ? Ascorbic Acid (VITAMIN C PO) Take 750 mg by mouth daily.    ? blood glucose meter kit and supplies KIT Dispense based on patient and insurance preference. Use up to three times  daily as directed. (FOR ICD-9 250.00, 250.01). 1 each 0  ? CINNAMON PO Take by mouth.    ? ELDERBERRY PO Take by mouth.    ? empagliflozin (JARDIANCE) 10 MG TABS tablet Take 1 tablet by mouth daily.    ? ferrous sulfate 325 (65 FE) MG EC tablet Take 325 mg by mouth daily.    ? fluticasone (FLONASE) 50 MCG/ACT nasal spray Place into both nostrils daily.    ? IBUPROFEN PO Take by mouth as needed. Knee pain    ? Lancets (ONETOUCH DELICA PLUS [id number]) MISC USE AS DIRECTED TO TEST BLOOD SUGAR 1-3 TIMES PER DAY 100 each 2  ? losartan-hydrochlorothiazide (HYZAAR) 50-12.5 MG tablet Take 1 tablet by mouth daily. 90 tablet 1  ? mometasone (ELOCON) 0.1 % cream Apply 1 application topically daily.  45 g 1  ? Multiple Vitamins-Minerals (ZINC PO) Take by mouth.    ? norethindrone (MICRONOR) 0.35 MG tablet TAKE 1 TABLET(0.35 MG) BY MOUTH DAILY 28 tablet 0  ? omeprazole (PRILOSEC) 20 MG capsule TAKE ONE CAPSULE BY MOUTH DAILY 30 capsule 3  ? OVER THE COUNTER MEDICATION Beet root    ? OVER THE COUNTER MEDICATION Liquid IV added to water daily    ? potassium chloride (KLOR-CON) 10 MEQ tablet TAKE TWO TABLETS BY MOUTH DAILY FOR 3 DAYS, THEN TAKE ONE TABLET BY MOUTH DAILY THEREAFTER 30 tablet 3  ? pravastatin (PRAVACHOL) 20 MG tablet TAKE ONE TABLET BY MOUTH DAILY 30 tablet 2  ? SALINE NASAL SPRAY NA Place into the nose as needed.    ? Semaglutide (RYBELSUS) 3 MG TABS Take 3 mg by mouth daily. 90 tablet 1  ? triamcinolone cream (KENALOG) 0.1 % Apply topically daily. 30 g 1  ? TURMERIC CURCUMIN PO Take by mouth.    ? valACYclovir (VALTREX) 500 MG tablet TAKE ONE TABLET BY MOUTH DAILY 30 tablet 5  ? zinc gluconate 50 MG tablet Take 50 mg by mouth daily.    ? ?No current facility-administered medications for this visit.  ? ? ?Family History  ?Problem Relation Age of Onset  ? Hyperlipidemia Mother   ? Hypertension Mother   ? Hypertension Father   ? Stroke Father   ? Hypertension Sister   ? Diabetes Sister   ? Arthritis Maternal Grandmother   ? Kidney failure Maternal Grandmother   ? Heart failure Maternal Grandmother   ? Heart failure Maternal Grandfather   ? Diabetes Maternal Grandfather   ? Breast cancer Paternal Grandmother   ?     Age 9s or 63s  ? Diabetes Mellitus II Paternal Grandmother   ? Dementia Paternal Grandmother   ? Throat cancer Paternal Grandmother   ? Stroke Paternal Grandfather   ? Cancer Paternal Aunt   ?     bladder or kidney cancer, Age 37/58   ? Ovarian cancer Cousin 19  ?     maternal uncle's daughter  ? Colon cancer Other   ?     maternal great-aunt, in her 58s  ? Ovarian cancer Other   ?     matrenal great-aunt, in her 71s  ? Esophageal cancer Neg Hx   ? Stomach cancer Neg Hx   ? Rectal cancer  Neg Hx   ? ? ?Review of Systems  ?All other systems reviewed and are negative. ? ?Exam:   ?BP 132/74   Pulse 90   Ht 5' 5.5" (1.664 m)   Wt 207 lb (93.9 kg)   LMP 09/14/2021 (Exact  Date)   SpO2 98%   BMI 33.92 kg/m?     ?General appearance: alert, cooperative and appears stated age ?Head: normocephalic, without obvious abnormality, atraumatic ?Neck: no adenopathy, supple, symmetrical, trachea midline and thyroid normal to inspection and palpation ?Lungs: clear to auscultation bilaterally ?Breasts: normal appearance, no masses or tenderness, No nipple retraction or dimpling, No nipple discharge or bleeding, No axillary adenopathy ?Heart: regular rate and rhythm ?Abdomen: soft, non-tender; no masses, no organomegaly ?Extremities: extremities normal, atraumatic, no cyanosis or edema ?Skin: skin color, texture, turgor normal. No rashes or lesions ?Lymph nodes: cervical, supraclavicular, and axillary nodes normal. ?Neurologic: grossly normal ? ?Pelvic: External genitalia:  no lesions ?             No abnormal inguinal nodes palpated. ?             Urethra:  normal appearing urethra with no masses, tenderness or lesions ?             Bartholins and Skenes: normal    ?             Vagina: normal appearing vagina with normal color and discharge, no lesions ?             Cervix: no lesions ?             Pap taken: yes ?Bimanual Exam:  Uterus:  normal size, contour, position, consistency, mobility, non-tender ?             Adnexa: no mass, fullness, tenderness ?             Rectal exam: yes.  Confirms. ?             Anus:  normal sphincter tone, no lesions ? ?Chaperone was present for exam:  Estill Bamberg, CMA ? ?Assessment:   ?Well woman visit with gynecologic exam. ?Hx positive LGSIL and positive HR HPV.  ?Hx HSV 1. ?DM. ?FH cancers.  Negative genetic testing.   Variants of unknown significance noted.  ? ?Plan: ?Mammogram screening discussed. ?Self breast awareness reviewed. ?Pap and HR HPV collected.  ?Testing for  GC/CT/trich/HIV/syphilis/hep C. ?Guidelines for Calcium, Vitamin D, regular exercise program including cardiovascular and weight bearing exercise. ?Refill of Micronor 3 packs, and 3 refills.  ?  ?Follow up annually and prn

## 2021-10-06 LAB — RPR: RPR Ser Ql: NONREACTIVE

## 2021-10-06 LAB — HIV ANTIBODY (ROUTINE TESTING W REFLEX): HIV 1&2 Ab, 4th Generation: NONREACTIVE

## 2021-10-06 LAB — HEPATITIS C ANTIBODY
Hepatitis C Ab: NONREACTIVE
SIGNAL TO CUT-OFF: 0.09 (ref <1.00)

## 2021-10-09 LAB — CYTOLOGY - PAP
Chlamydia: NEGATIVE
Comment: NEGATIVE
Comment: NEGATIVE
Comment: NEGATIVE
Comment: NORMAL
Diagnosis: NEGATIVE
Diagnosis: REACTIVE
High risk HPV: NEGATIVE
Neisseria Gonorrhea: NEGATIVE
Trichomonas: NEGATIVE

## 2021-10-26 ENCOUNTER — Other Ambulatory Visit: Payer: Self-pay | Admitting: Family Medicine

## 2021-11-02 ENCOUNTER — Encounter: Payer: Self-pay | Admitting: Family Medicine

## 2021-11-02 ENCOUNTER — Ambulatory Visit: Payer: 59 | Admitting: Family Medicine

## 2021-11-02 VITALS — BP 126/82 | HR 98 | Temp 98.8°F | Wt 210.2 lb

## 2021-11-02 DIAGNOSIS — E1169 Type 2 diabetes mellitus with other specified complication: Secondary | ICD-10-CM | POA: Diagnosis not present

## 2021-11-02 DIAGNOSIS — R252 Cramp and spasm: Secondary | ICD-10-CM | POA: Diagnosis not present

## 2021-11-02 DIAGNOSIS — I10 Essential (primary) hypertension: Secondary | ICD-10-CM

## 2021-11-02 LAB — BASIC METABOLIC PANEL
BUN: 12 mg/dL (ref 6–23)
CO2: 26 mEq/L (ref 19–32)
Calcium: 9.4 mg/dL (ref 8.4–10.5)
Chloride: 100 mEq/L (ref 96–112)
Creatinine, Ser: 0.86 mg/dL (ref 0.40–1.20)
GFR: 80.55 mL/min (ref >60.00)
Glucose, Bld: 256 mg/dL — ABNORMAL HIGH (ref 70–99)
Potassium: 3.6 mEq/L (ref 3.5–5.1)
Sodium: 133 mEq/L — ABNORMAL LOW (ref 135–145)

## 2021-11-02 LAB — HEMOGLOBIN A1C: Hgb A1c MFr Bld: 7.9 % — ABNORMAL HIGH (ref 4.6–6.5)

## 2021-11-02 NOTE — Progress Notes (Signed)
Subjective:    Patient ID: Linda Conrad, female    DOB: 20-Jan-1976, 46 y.o.   MRN: [id number]  Chief Complaint  Patient presents with   Pain    Caught a cramp Saturday in left calf, still having pain. Cramping caused big toe and second toe to curl under and was not able to straighten it out. States calf is still tight    HPI Patient was seen today for acute concern.  Patient endorses developing a painful cramp in her left calf and toes of left foot 2 days ago.  At the time patient was at a yard sale, she changed into flip-flops as she started to get hot.  Patient became diaphoretic and presyncopal after cramps started.  Given yellow mustard and water.  Patient states she was drinking lemonade and did not have any water that day.  Patient taking losartan-hydrochlorothiazide 50-12.5 for blood pressure.  Endorses potassium supplement daily.  On statin without issue times a while without issue.  Patient denies LE edema or erythema.  Patient inquires about checking A1c.    Past Medical History:  Diagnosis Date   Abnormal Pap smear of cervix - hpv per report in 2014, seeing gyn 11/16/2013   normal pap with gyn 2015   Anemia    Arthritis    hand   CIN I (cervical intraepithelial neoplasia I) 2020   Diabetes mellitus without complication ([place])    Family history of bladder cancer    Family history of breast cancer    Family history of kidney cancer    Family history of ovarian cancer    Family history of prostate cancer    Foreign body of hand, right 07/2011   right MP   GERD (gastroesophageal reflux disease)    Headache(784.0)    sinus   HSV-1 (herpes simplex virus 1) infection    Hyperlipidemia    Hypertension    denies HTN, but takes Hyzaar    Kidney stones    Papanicolaou smear of cervix with positive high risk human papilloma virus (HPV) test 2021   Needs cervical cancer screenig in 2022   Stenosing tenosynovitis of wrist 07/2011   right    Allergies  Allergen Reactions    Codeine Other (See Comments)    Family hx of allergy Family hx of allergy   Oak Bark [Quercus Robur]     Oak trees   Soap Rash    Everything except Dove    ROS General: Denies fever, chills, night sweats, changes in weight, changes in appetite HEENT: Denies headaches, ear pain, changes in vision, rhinorrhea, sore throat CV: Denies CP, palpitations, SOB, orthopnea Pulm: Denies SOB, cough, wheezing GI: Denies abdominal pain, nausea, vomiting, diarrhea, constipation GU: Denies dysuria, hematuria, frequency, vaginal discharge Msk: Denies muscle cramps, joint pains +L calf soreness, cramping Neuro: Denies weakness, numbness, tingling Skin: Denies rashes, bruising Psych: Denies depression, anxiety, hallucinations     Objective:    Blood pressure 126/82, pulse 98, temperature 98.8 F (37.1 C), temperature source Oral, weight 210 lb 3.2 oz (95.3 kg), SpO2 97 %.  Gen. Pleasant, well-nourished, in no distress, normal affect   HEENT: [place]/AT, face symmetric, conjunctiva clear, no scleral icterus, PERRLA, EOMI, nares patent without drainage Lungs: no accessory muscle use, CTAB, no wheezes or rales Cardiovascular: RRR, no m/r/g, no peripheral edema.   Musculoskeletal: TTP in left popliteal fossa/superior calf.  No mid calf tenderness bilaterally.  no deformities, no cyanosis or clubbing, normal tone Neuro:  A&Ox3, CN II-XII  intact, normal gait Skin:  Warm, no lesions/ rash   Wt Readings from Last 3 Encounters:  11/02/21 210 lb 3.2 oz (95.3 kg)  10/05/21 207 lb (93.9 kg)  05/13/21 205 lb (93 kg)    Lab Results  Component Value Date   WBC 5.4 05/13/2021   HGB 14.1 05/13/2021   HCT 43.5 05/13/2021   PLT 300.0 05/13/2021   GLUCOSE 158 (H) 05/13/2021   CHOL 155 05/13/2021   TRIG 88.0 05/13/2021   HDL 61.50 05/13/2021   LDLCALC 76 05/13/2021   ALT 13 05/13/2021   AST 17 05/13/2021   NA 136 05/13/2021   K 3.3 (L) 05/13/2021   CL 96 05/13/2021   CREATININE 0.91 05/13/2021   BUN 15  05/13/2021   CO2 30 05/13/2021   TSH 0.82 05/13/2021   HGBA1C 10.0 (H) 05/13/2021   MICROALBUR <0.7 10/06/2020    Assessment/Plan:  Leg cramp -Symptoms likely 2/2 electrolyte abnormality from patient being dehydrated, taking hydrochlorothiazide, and sweating while at ER still over the weekend. -We will check BMP -Continue supportive care including massage, heat, stretching, topical analgesics, Tylenol or NSAIDs as needed, etc. -Patient encouraged to increase p.o. intake of water and fluids -Given handout  - Plan: Basic metabolic panel  Type 2 diabetes mellitus with other specified complication, without long-term current use of insulin (HCC) -Uncontrolled -Hemoglobin A1c 10.0% on 05/13/2021 -Continue current medications including metformin 500 mg twice daily and Jardiance 10 mg daily, Rybelsus 3 mg daily -Discussed the importance of lifestyle modifications  - Plan: Hemoglobin A1c  Essential hypertension  -Controlled -Continue current medication including losartan-hydrochlorothiazide 50-12.5 mg daily and Klor-Con 10 mEq daily -Continue lifestyle modifications - Plan: Basic metabolic panel  F/u as needed  Grier Mitts, MD

## 2021-11-13 ENCOUNTER — Encounter: Payer: Self-pay | Admitting: Family Medicine

## 2021-11-13 ENCOUNTER — Ambulatory Visit: Payer: 59 | Admitting: Family Medicine

## 2021-11-13 ENCOUNTER — Ambulatory Visit: Payer: 59 | Admitting: Family

## 2021-11-13 VITALS — BP 138/86 | HR 87 | Temp 98.5°F | Wt 207.6 lb

## 2021-11-13 DIAGNOSIS — E1169 Type 2 diabetes mellitus with other specified complication: Secondary | ICD-10-CM

## 2021-11-13 DIAGNOSIS — I10 Essential (primary) hypertension: Secondary | ICD-10-CM

## 2021-11-13 DIAGNOSIS — H6591 Unspecified nonsuppurative otitis media, right ear: Secondary | ICD-10-CM

## 2021-11-13 NOTE — Progress Notes (Signed)
Subjective:    Patient ID: Linda Conrad, female    DOB: 12/01/1975, 46 y.o.   MRN: [id number]  Chief Complaint  Patient presents with   Establish Care    HPI Patient was seen today for TOC and f/u.  Pt states she has been feeling better since last OFV.  Pt hadn't been checking bs. Can use smart watch to check bp.  Walking 10,000 steps most days, exercising, and eating more salads.  A1C was 10% on 05/13/21.  Denies having any more leg cramps.  Reviewed labs from last OFV.  Glucose was 256, Na was 133, but normal when corrected.  Past Medical History:  Diagnosis Date   Abnormal Pap smear of cervix - hpv per report in 2014, seeing gyn 11/16/2013   normal pap with gyn 2015   Anemia    Arthritis    hand   CIN I (cervical intraepithelial neoplasia I) 2020   Diabetes mellitus without complication ([place])    Family history of bladder cancer    Family history of breast cancer    Family history of kidney cancer    Family history of ovarian cancer    Family history of prostate cancer    Foreign body of hand, right 07/2011   right MP   GERD (gastroesophageal reflux disease)    Headache(784.0)    sinus   HSV-1 (herpes simplex virus 1) infection    Hyperlipidemia    Hypertension    denies HTN, but takes Hyzaar    Kidney stones    Papanicolaou smear of cervix with positive high risk human papilloma virus (HPV) test 2021   Needs cervical cancer screenig in 2022   Stenosing tenosynovitis of wrist 07/2011   right    Allergies  Allergen Reactions   Codeine Other (See Comments)    Family hx of allergy Family hx of allergy   Oak Bark [Quercus Robur]     Oak trees   Soap Rash    Everything except Dove    ROS General: Denies fever, chills, night sweats, changes in weight, changes in appetite HEENT: Denies headaches, ear pain, changes in vision, rhinorrhea, sore throat CV: Denies CP, palpitations, SOB, orthopnea Pulm: Denies SOB, cough, wheezing GI: Denies abdominal pain, nausea,  vomiting, diarrhea, constipation GU: Denies dysuria, hematuria, frequency, vaginal discharge Msk: Denies muscle cramps, joint pains Neuro: Denies weakness, numbness, tingling Skin: Denies rashes, bruising Psych: Denies depression, anxiety, hallucinations    Objective:    Blood pressure 138/86, pulse 87, temperature 98.5 F (36.9 C), temperature source Oral, weight 207 lb 9.6 oz (94.2 kg), SpO2 99 %.  Gen. Pleasant, well-nourished, in no distress, normal affect   HEENT: [place]/AT, face symmetric, conjunctiva clear, no scleral icterus, PERRLA, EOMI, nares patent without drainage, pharynx without erythema or exudate.  Normal ext ears.  L TM normal.  R TM with effusion, no erythema. Lungs: no accessory muscle use, CTAB, no wheezes or rales Cardiovascular: RRR, no m/r/g, no peripheral edema Musculoskeletal: No deformities, no cyanosis or clubbing, normal tone Neuro:  A&Ox3, CN II-XII intact, normal gait Skin:  Warm, no lesions/ rash   Wt Readings from Last 3 Encounters:  11/13/21 207 lb 9.6 oz (94.2 kg)  11/02/21 210 lb 3.2 oz (95.3 kg)  10/05/21 207 lb (93.9 kg)    Lab Results  Component Value Date   WBC 5.4 05/13/2021   HGB 14.1 05/13/2021   HCT 43.5 05/13/2021   PLT 300.0 05/13/2021   GLUCOSE 256 (H) 11/02/2021  CHOL 155 05/13/2021   TRIG 88.0 05/13/2021   HDL 61.50 05/13/2021   LDLCALC 76 05/13/2021   ALT 13 05/13/2021   AST 17 05/13/2021   NA 133 (L) 11/02/2021   K 3.6 11/02/2021   CL 100 11/02/2021   CREATININE 0.86 11/02/2021   BUN 12 11/02/2021   CO2 26 11/02/2021   TSH 0.82 05/13/2021   HGBA1C 7.9 (H) 11/02/2021   MICROALBUR <0.7 10/06/2020    Assessment/Plan:  Type 2 diabetes mellitus with other specified complication, without long-term current use of insulin (HCC) -Hgb A1C 7.9 % on 11/02/21 -discussed the importance of lifestyle modifications -continue jardiance 10 mg, metformin 500 mg BID, Rybelsus 3 mg -continue statin and ARB  Essential  hypertension -elevated -137/88 on recheck -continue losartan-HCTZ 50-12.5 mg dialy -check bp at home, for elevations greater than 140/90 at chest medication  Fluid level behind tympanic membrane of right ear -OTC antihistamine and or flonase prn -Continue to monitor  F/u 3-4 months  Grier Mitts, MD

## 2021-11-26 ENCOUNTER — Other Ambulatory Visit: Payer: Self-pay | Admitting: *Deleted

## 2021-11-26 MED ORDER — POTASSIUM CHLORIDE ER 10 MEQ PO TBCR: EXTENDED_RELEASE_TABLET | ORAL | 3 refills | Status: DC

## 2021-12-02 ENCOUNTER — Telehealth: Payer: Self-pay | Admitting: Family Medicine

## 2021-12-02 NOTE — Telephone Encounter (Signed)
Pt call and stated she is returning your call and want you to call her back.

## 2021-12-02 NOTE — Telephone Encounter (Signed)
Pt is returning jaton call and she is aware Garnette Czech will call her back

## 2021-12-04 NOTE — Telephone Encounter (Signed)
See result note.  

## 2021-12-07 ENCOUNTER — Other Ambulatory Visit: Payer: Self-pay | Admitting: *Deleted

## 2021-12-08 MED ORDER — EMPAGLIFLOZIN 10 MG PO TABS: 10.0000 mg | ORAL_TABLET | Freq: Every day | ORAL | 0 refills | Status: DC

## 2021-12-10 ENCOUNTER — Other Ambulatory Visit: Payer: Self-pay | Admitting: *Deleted

## 2021-12-10 DIAGNOSIS — E1159 Type 2 diabetes mellitus with other circulatory complications: Secondary | ICD-10-CM

## 2021-12-10 MED ORDER — LOSARTAN POTASSIUM-HCTZ 50-12.5 MG PO TABS: 1.0000 | ORAL_TABLET | Freq: Every day | ORAL | 1 refills | Status: DC

## 2021-12-10 MED ORDER — METFORMIN HCL 500 MG PO TABS: 500.0000 mg | ORAL_TABLET | Freq: Two times a day (BID) | ORAL | 2 refills | Status: DC

## 2021-12-14 ENCOUNTER — Other Ambulatory Visit: Payer: Self-pay | Admitting: *Deleted

## 2021-12-14 MED ORDER — PRAVASTATIN SODIUM 20 MG PO TABS: 20.0000 mg | ORAL_TABLET | Freq: Every day | ORAL | 2 refills | Status: DC

## 2021-12-23 ENCOUNTER — Other Ambulatory Visit: Payer: Self-pay | Admitting: Family Medicine

## 2021-12-23 DIAGNOSIS — Z1231 Encounter for screening mammogram for malignant neoplasm of breast: Secondary | ICD-10-CM

## 2022-01-01 ENCOUNTER — Ambulatory Visit
Admission: RE | Admit: 2022-01-01 | Discharge: 2022-01-01 | Disposition: A | Discharge status: Home / Self Care | Payer: 59 | Source: Ambulatory Visit | Attending: Family Medicine | Admitting: Family Medicine

## 2022-01-01 DIAGNOSIS — Z1231 Encounter for screening mammogram for malignant neoplasm of breast: Secondary | ICD-10-CM

## 2022-02-05 ENCOUNTER — Other Ambulatory Visit: Payer: Self-pay | Admitting: *Deleted

## 2022-02-05 MED ORDER — OMEPRAZOLE 20 MG PO CPDR: 20.0000 mg | DELAYED_RELEASE_CAPSULE | Freq: Every day | ORAL | 3 refills | Status: DC

## 2022-03-15 ENCOUNTER — Ambulatory Visit: Payer: 59 | Admitting: Family Medicine

## 2022-03-18 ENCOUNTER — Ambulatory Visit: Payer: 59 | Admitting: Family Medicine

## 2022-03-18 VITALS — BP 120/88 | HR 85 | Temp 98.9°F | Wt 207.2 lb

## 2022-03-18 DIAGNOSIS — I10 Essential (primary) hypertension: Secondary | ICD-10-CM | POA: Diagnosis not present

## 2022-03-18 DIAGNOSIS — Z23 Encounter for immunization: Secondary | ICD-10-CM | POA: Diagnosis not present

## 2022-03-18 DIAGNOSIS — E1169 Type 2 diabetes mellitus with other specified complication: Secondary | ICD-10-CM | POA: Diagnosis not present

## 2022-03-18 LAB — POCT GLYCOSYLATED HEMOGLOBIN (HGB A1C): Hemoglobin A1C: 7.8 % — AB (ref 4.0–5.6)

## 2022-03-18 MED ORDER — JARDIANCE 25 MG PO TABS: 25.0000 mg | ORAL_TABLET | Freq: Every day | ORAL | 3 refills | Status: DC

## 2022-03-18 NOTE — Patient Instructions (Signed)
A new prescription for Jardiance 25 mg daily was sent to your pharmacy.  It appears that someone refilled the Jardiance after looking at an older version of the prescription that was the lower dose.

## 2022-03-18 NOTE — Progress Notes (Signed)
Subjective:    Patient ID: Linda Conrad, female    DOB: 04/28/1976, 46 y.o.   MRN: [id number]  Chief Complaint  Patient presents with   Follow-up    DM and BP     HPI Patient was seen today for follow-up.  Patient states she has been doing well overall.  States prescription for Vania Rea was a different dose than previously taking.  Patient states blood sugar yesterday morning was 268 after drinking sweet tea/lemonade from [place].  Checking blood sugar intermittently at home.  Hemoglobin A1c was 7.9% on 11/02/2021.  BP was 135/77.  Patient denies headaches, CP, changes in vision.  Pt inquires about influenza and COVID vaccines.  Patient had prolonged lymphadenopathy after last COVID-vaccine 03/2021.  Past Medical History:  Diagnosis Date   Abnormal Pap smear of cervix - hpv per report in 2014, seeing gyn 11/16/2013   normal pap with gyn 2015   Anemia    Arthritis    hand   CIN I (cervical intraepithelial neoplasia I) 2020   Diabetes mellitus without complication ([place])    Family history of bladder cancer    Family history of breast cancer    Family history of kidney cancer    Family history of ovarian cancer    Family history of prostate cancer    Foreign body of hand, right 07/2011   right MP   GERD (gastroesophageal reflux disease)    Headache(784.0)    sinus   HSV-1 (herpes simplex virus 1) infection    Hyperlipidemia    Hypertension    denies HTN, but takes Hyzaar    Kidney stones    Papanicolaou smear of cervix with positive high risk human papilloma virus (HPV) test 2021   Needs cervical cancer screenig in 2022   Stenosing tenosynovitis of wrist 07/2011   right    Allergies  Allergen Reactions   Codeine Other (See Comments)    Family hx of allergy Family hx of allergy   Oak Bark [Quercus Robur]     Oak trees   Soap Rash    Everything except Dove    ROS General: Denies fever, chills, night sweats, changes in weight, changes in appetite HEENT: Denies  headaches, ear pain, changes in vision, rhinorrhea, sore throat CV: Denies CP, palpitations, SOB, orthopnea Pulm: Denies SOB, cough, wheezing GI: Denies abdominal pain, nausea, vomiting, diarrhea, constipation GU: Denies dysuria, hematuria, frequency, vaginal discharge Msk: Denies muscle cramps, joint pains Neuro: Denies weakness, numbness, tingling Skin: Denies rashes, bruising Psych: Denies depression, anxiety, hallucinations     Objective:    Blood pressure (!) 140/94, pulse 85, temperature 98.9 F (37.2 C), temperature source Oral, weight 207 lb 3.2 oz (94 kg), SpO2 97 %.  Gen. Pleasant, well-nourished, in no distress, normal affect   HEENT: [place]/AT, face symmetric, conjunctiva clear, no scleral icterus, PERRLA, EOMI, nares patent without drainage Lungs: no accessory muscle use, CTAB, no wheezes or rales Cardiovascular: RRR, no m/r/g, no peripheral edema Musculoskeletal: No deformities, no cyanosis or clubbing, normal tone Neuro:  A&Ox3, CN II-XII intact, normal gait Skin:  Warm, no lesions/ rash   Wt Readings from Last 3 Encounters:  03/18/22 207 lb 3.2 oz (94 kg)  11/13/21 207 lb 9.6 oz (94.2 kg)  11/02/21 210 lb 3.2 oz (95.3 kg)    Lab Results  Component Value Date   WBC 5.4 05/13/2021   HGB 14.1 05/13/2021   HCT 43.5 05/13/2021   PLT 300.0 05/13/2021   GLUCOSE 256 (H) 11/02/2021  CHOL 155 05/13/2021   TRIG 88.0 05/13/2021   HDL 61.50 05/13/2021   LDLCALC 76 05/13/2021   ALT 13 05/13/2021   AST 17 05/13/2021   NA 133 (L) 11/02/2021   K 3.6 11/02/2021   CL 100 11/02/2021   CREATININE 0.86 11/02/2021   BUN 12 11/02/2021   CO2 26 11/02/2021   TSH 0.82 05/13/2021   HGBA1C 7.9 (H) 11/02/2021   MICROALBUR <0.7 10/06/2020    Assessment/Plan:  Type 2 diabetes mellitus with other specified complication, without long-term current use of insulin ([place])  -hgb A1C 7.9% on 11/02/21 -per chart review last jardiance rx refilled by staff using an older rx that was the  lower dose (10 mg). -new rx for correct dose Jardiance 25 mg sent to pharmacy. -continue metformin 500 mg BID -Continue pravastatin 20 mg daily - Plan: Microalbumin/Creatinine Ratio, Urine, POC HgB A1c, JARDIANCE 25 MG TABS tablet  Essential hypertension -elevated -recheck bp -lifestyle  modifications -continue losartan-HCTZ 50-12.5 mg  Need for influenza vaccination -influenza vaccine given this visit  F/u in 3 months, sooner if needed  Grier Mitts, MD

## 2022-03-29 ENCOUNTER — Other Ambulatory Visit: Payer: 59

## 2022-04-04 ENCOUNTER — Encounter: Payer: Self-pay | Admitting: Family Medicine

## 2022-04-08 ENCOUNTER — Other Ambulatory Visit: Payer: Self-pay | Admitting: Family Medicine

## 2022-05-05 ENCOUNTER — Other Ambulatory Visit: Payer: Self-pay | Admitting: Family Medicine

## 2022-05-20 ENCOUNTER — Other Ambulatory Visit: Payer: Self-pay | Admitting: *Deleted

## 2022-05-21 MED ORDER — VALACYCLOVIR HCL 500 MG PO TABS: 500.0000 mg | ORAL_TABLET | Freq: Every day | ORAL | 1 refills | Status: DC

## 2022-06-01 NOTE — Progress Notes (Signed)
GYNECOLOGY  VISIT   HPI: 47 y.o.   Single  African American  female   [id number] with Patient's last menstrual period was 06/02/2022.   here for   irregular periods. Started noticing clots and blood when wiping. Pt started period today but started noticing blood on Friday.  Last week had bleeding with wiping and noticed clots in the toilet.  She was expecting her period to start on 06/08/21.  Bleeding has now stopped.   She thinks she may have taken a pill a couple hours late on more than one occasion.  This occurred 3 times in the last month.   Periods prior to this were regular.   Same sexual partner.   Hx suspected uterine fibroids on CT scan 05/22/20.  Prior pelvic US 07/07/17, no fibroids noted.  GYNECOLOGIC HISTORY: Patient's last menstrual period was 06/02/2022. Contraception: oral contraceptives, Micronor. Menopausal hormone therapy:  n/a Last mammogram:  01/01/22 Breast Density Category B, BI-RADS CATEGORY 1 Negative Last pap smear:   10/05/21 negative/neg HR HPV, 10/01/20 ASCUS: HR HPV positive        OB History     Gravida  0   Para  0   Term  0   Preterm  0   AB  0   Living  0      SAB  0   IAB  0   Ectopic  0   Multiple  0   Live Births  0              Patient Active Problem List   Diagnosis Date Noted   Genetic testing 02/27/2019   Family history of ovarian cancer    Family history of breast cancer    Family history of prostate cancer    Family history of bladder cancer    Family history of kidney cancer    Herpes 10/25/2018   Anemia 08/07/2017   Hx of abnormal cervical Pap smear 08/01/2015   Type 2 diabetes mellitus with other specified complication (HCC) 09/13/2014   Hypertension associated with diabetes (HCC) 11/16/2013   GERD (gastroesophageal reflux disease) 11/16/2013   Hearing loss - followed by [facility] 11/16/2013    Past Medical History:  Diagnosis Date   Abnormal Pap smear of cervix - hpv per report in 2014, seeing gyn  11/16/2013   normal pap with gyn 2015   Anemia    Arthritis    hand   CIN I (cervical intraepithelial neoplasia I) 2020   Diabetes mellitus without complication (HCC)    Family history of bladder cancer    Family history of breast cancer    Family history of kidney cancer    Family history of ovarian cancer    Family history of prostate cancer    Foreign body of hand, right 07/2011   right MP   GERD (gastroesophageal reflux disease)    Headache(784.0)    sinus   HSV-1 (herpes simplex virus 1) infection    Hyperlipidemia    Hypertension    denies HTN, but takes Hyzaar    Kidney stones    Papanicolaou smear of cervix with positive high risk human papilloma virus (HPV) test 2021   Needs cervical cancer screenig in 2022   Stenosing tenosynovitis of wrist 07/2011   right    Past Surgical History:  Procedure Laterality Date   CYST EXCISION     right thumb   DORSAL COMPARTMENT RELEASE  08/26/2011   Procedure: RELEASE DORSAL COMPARTMENT (DEQUERVAIN);  Surgeon:  Wyn Forster., MD;  Location: [facility];  Service: Orthopedics;  Laterality: Right;   FOREIGN BODY REMOVAL  08/26/2011   Procedure: FOREIGN BODY REMOVAL ADULT;  Surgeon: Wyn Forster., MD;  Location: [facility];  Service: Orthopedics;  Laterality: Right;   KNEE ARTHROSCOPY Right 06/10/2021    Current Outpatient Medications  Medication Sig Dispense Refill   acetaminophen (TYLENOL) 650 MG CR tablet Take 650 mg by mouth every 8 (eight) hours as needed for pain.     Ascorbic Acid (VITAMIN C PO) Take 750 mg by mouth daily.     blood glucose meter kit and supplies KIT Dispense based on patient and insurance preference. Use up to three times daily as directed. (FOR ICD-9 250.00, 250.01). 1 each 0   CINNAMON PO Take by mouth.     ELDERBERRY PO Take by mouth.     ferrous sulfate 325 (65 FE) MG EC tablet Take 325 mg by mouth daily.     fluticasone (FLONASE) 50 MCG/ACT nasal spray Place into  both nostrils daily.     IBUPROFEN PO Take by mouth as needed. Knee pain     JARDIANCE 25 MG TABS tablet Take 1 tablet (25 mg total) by mouth daily. 90 tablet 3   Lancets (ONETOUCH DELICA PLUS LANCET33G) MISC USE AS DIRECTED TO TEST BLOOD SUGAR 1-3 TIMES PER DAY 100 each 2   losartan-hydrochlorothiazide (HYZAAR) 50-12.5 MG tablet Take 1 tablet by mouth daily. 90 tablet 1   metFORMIN (GLUCOPHAGE) 500 MG tablet TAKE 1 TABLET BY MOUTH TWICE A DAY WITH A MEAL 60 tablet 2   mometasone (ELOCON) 0.1 % cream Apply 1 application topically daily. 45 g 1   Multiple Vitamins-Minerals (ZINC PO) Take by mouth.     norethindrone (MICRONOR) 0.35 MG tablet TAKE 1 TABLET(0.35 MG) BY MOUTH DAILY 84 tablet 3   OVER THE COUNTER MEDICATION Beet root     OVER THE COUNTER MEDICATION Liquid IV added to water daily     potassium chloride (KLOR-CON) 10 MEQ tablet TAKE 2 TABLETS BY MOUTH DAILY FOR 3 DAYS, THEN TAKE 1 TABLET BY MOUTH DAILY THERAFTER 30 tablet 3   pravastatin (PRAVACHOL) 20 MG tablet Take 1 tablet (20 mg total) by mouth daily. 30 tablet 2   SALINE NASAL SPRAY NA Place into the nose as needed.     Semaglutide (RYBELSUS) 3 MG TABS Take 3 mg by mouth daily. 90 tablet 1   triamcinolone cream (KENALOG) 0.1 % Apply topically daily. 30 g 1   TURMERIC CURCUMIN PO Take by mouth.     valACYclovir (VALTREX) 500 MG tablet Take 1 tablet (500 mg total) by mouth daily. 90 tablet 1   zinc gluconate 50 MG tablet Take 50 mg by mouth daily.     omeprazole (PRILOSEC) 20 MG capsule Take 1 capsule (20 mg total) by mouth daily. (Patient not taking: Reported on 06/02/2022) 30 capsule 3   No current facility-administered medications for this visit.     ALLERGIES: Codeine, Oak bark [quercus robur], and Soap  Family History  Problem Relation Age of Onset   Hyperlipidemia Mother    Hypertension Mother    Hypertension Father    Stroke Father    Hypertension Sister    Diabetes Sister    Arthritis Maternal Grandmother     Kidney failure Maternal Grandmother    Heart failure Maternal Grandmother    Heart failure Maternal Grandfather    Diabetes Maternal Grandfather  Breast cancer Paternal Grandmother        Age 89s or 52s   Diabetes Mellitus II Paternal Grandmother    Dementia Paternal Grandmother    Throat cancer Paternal Grandmother    Stroke Paternal Grandfather    Cancer Paternal Aunt        bladder or kidney cancer, Age 36/58    Ovarian cancer Cousin 66       maternal uncle's daughter   Colon cancer Other        maternal great-aunt, in her 58s   Ovarian cancer Other        matrenal great-aunt, in her 26s   Esophageal cancer Neg Hx    Stomach cancer Neg Hx    Rectal cancer Neg Hx     Social History   Socioeconomic History   Marital status: Single    Spouse name: Not on file   Number of children: 0   Years of education: Not on file   Highest education level: Not on file  Occupational History   Not on file  Tobacco Use   Smoking status: Never   Smokeless tobacco: Never  Vaping Use   Vaping Use: Never used  Substance and Sexual Activity   Alcohol use: Not Currently   Drug use: No   Sexual activity: Yes    Birth control/protection: Pill    Comment: Micronor  Other Topics Concern   Not on file  Social History Narrative   Work or School: works with senior center      Home Situation: lives alone      Spiritual Beliefs: none, Christian      Lifestyle: active at work but not otherwise; avoiding carbs            Social Determinants of Corporate investment banker Strain: Not on file  Food Insecurity: Not on file  Transportation Needs: Not on file  Physical Activity: Not on file  Stress: Not on file  Social Connections: Not on file  Intimate Partner Violence: Not on file    Review of Systems  All other systems reviewed and are negative.   PHYSICAL EXAMINATION:    BP 132/84 (BP Location: Left Arm, Patient Position: Sitting, Cuff Size: Normal)   Pulse 84   Ht 5\' 6"   (1.676 m)   Wt 207 lb (93.9 kg)   LMP 06/02/2022   SpO2 100%   BMI 33.41 kg/m     General appearance: alert, cooperative and appears stated age   Pelvic: External genitalia:  no lesions              Urethra:  normal appearing urethra with no masses, tenderness or lesions              Bartholins and Skenes: normal                 Vagina: normal appearing vagina with normal color and discharge, no lesions              Cervix: no lesions.  Small amount of clot in the vagina.                 Bimanual Exam:  Uterus:  normal size, contour, position, consistency, mobility, non-tender              Adnexa: no mass, fullness, tenderness   Chaperone was present for exam:  Irving Burton  ASSESSMENT  Irregular menses.  On POPs.   Late pills.  FH cancers.  Negative genetic  testing.   Variants of unknown significance noted.    HTN.   PLAN  UPT now.  Increase birth control pills to twice a day for 3 days.  We discussed birth control alternatives of Depo Provera, Mirena or Paragard IUD, Nexplanon, barrier contraception, and permanent sterilization.  Pelvic US if has continued irregular bleeding.  Bleeding calendar.  Fu for annual exam and prn.    An After Visit Summary was printed and given to the patient.  30 min  total time was spent for this patient encounter, including preparation, face-to-face counseling with the patient, coordination of care, and documentation of the encounter.

## 2022-06-02 ENCOUNTER — Encounter: Payer: Self-pay | Admitting: Obstetrics and Gynecology

## 2022-06-02 ENCOUNTER — Ambulatory Visit: Payer: 59 | Admitting: Obstetrics and Gynecology

## 2022-06-02 VITALS — BP 132/84 | HR 84 | Ht 66.0 in | Wt 207.0 lb

## 2022-06-02 DIAGNOSIS — N926 Irregular menstruation, unspecified: Secondary | ICD-10-CM

## 2022-06-17 ENCOUNTER — Ambulatory Visit: Payer: 59 | Admitting: Family Medicine

## 2022-06-17 ENCOUNTER — Encounter: Payer: Self-pay | Admitting: Family Medicine

## 2022-06-17 VITALS — BP 132/74 | HR 100 | Temp 98.4°F | Ht 66.0 in | Wt 214.6 lb

## 2022-06-17 DIAGNOSIS — E1159 Type 2 diabetes mellitus with other circulatory complications: Secondary | ICD-10-CM | POA: Diagnosis not present

## 2022-06-17 DIAGNOSIS — I152 Hypertension secondary to endocrine disorders: Secondary | ICD-10-CM

## 2022-06-17 DIAGNOSIS — E782 Mixed hyperlipidemia: Secondary | ICD-10-CM

## 2022-06-17 DIAGNOSIS — I10 Essential (primary) hypertension: Secondary | ICD-10-CM

## 2022-06-17 DIAGNOSIS — E1169 Type 2 diabetes mellitus with other specified complication: Secondary | ICD-10-CM

## 2022-06-17 DIAGNOSIS — B009 Herpesviral infection, unspecified: Secondary | ICD-10-CM

## 2022-06-17 LAB — POCT GLYCOSYLATED HEMOGLOBIN (HGB A1C): Hemoglobin A1C: 7.8 % — AB (ref 4.0–5.6)

## 2022-06-17 MED ORDER — LOSARTAN POTASSIUM-HCTZ 50-12.5 MG PO TABS: 1.0000 | ORAL_TABLET | Freq: Every day | ORAL | 1 refills | Status: DC

## 2022-06-17 MED ORDER — METFORMIN HCL 500 MG PO TABS: ORAL_TABLET | ORAL | 1 refills | Status: DC

## 2022-06-17 MED ORDER — POTASSIUM CHLORIDE ER 10 MEQ PO TBCR: EXTENDED_RELEASE_TABLET | ORAL | 1 refills | Status: DC

## 2022-06-17 MED ORDER — VALACYCLOVIR HCL 500 MG PO TABS: 500.0000 mg | ORAL_TABLET | Freq: Every day | ORAL | 1 refills | Status: DC

## 2022-06-17 MED ORDER — PRAVASTATIN SODIUM 20 MG PO TABS: 20.0000 mg | ORAL_TABLET | Freq: Every day | ORAL | 1 refills | Status: DC

## 2022-06-17 NOTE — Assessment & Plan Note (Signed)
Continue metformin and Jardiance

## 2022-06-17 NOTE — Patient Instructions (Signed)
Do not forget to schedule your physical at your convenience.

## 2022-06-17 NOTE — Progress Notes (Signed)
Established Patient Office Visit   Subjective  Patient ID: Linda Conrad, female    DOB: [birth year]  Age: 47 y.o. MRN: [id number]  Chief Complaint  Patient presents with   Medical Management of Chronic Issues    Pt is following up for DM and BP.     HPI Patient seen today for follow-up on HTN and DM.  Checking BP at home via [place] typically 123/80, 115/75.  Pt suppressed BP elevated in clinic.  Did have lunch at [employer] and a frozen jimmy dean breakfast bowl this a.m.  Patient started cycling classes which are going well. Patient notes blood sugar stable.   Patient notes dryness in the nose and throat irritation.  May have increased since cold sore episode.  Taking Valtrex daily.  Patient notes an episode of heart fluttering.  First noted in early December.  Had another episode a few weeks ago.  Not currently feeling the sensation.    ROS Negative unless stated above    Objective:     BP 132/74 (BP Location: Right Arm, Patient Position: Sitting, Cuff Size: Large)   Pulse 100   Temp 98.4 F (36.9 C) (Oral)   Ht '5\' 6"'$  (1.676 m)   Wt 214 lb 9.6 oz (97.3 kg)   LMP 06/02/2022   SpO2 98%   BMI 34.64 kg/m    Physical Exam Constitutional:      Appearance: Normal appearance.  HENT:     Head: Normocephalic and atraumatic.     Nose: Nose normal.     Mouth/Throat:     Mouth: Mucous membranes are moist.  Eyes:     Extraocular Movements: Extraocular movements intact.     Pupils: Pupils are equal, round, and reactive to light.  Cardiovascular:     Rate and Rhythm: Normal rate and regular rhythm.     Pulses: Normal pulses.     Heart sounds: Normal heart sounds.  Pulmonary:     Effort: Pulmonary effort is normal.     Breath sounds: Normal breath sounds.  Musculoskeletal:        General: Normal range of motion.  Skin:    General: Skin is warm and dry.  Neurological:     General: No focal deficit present.     Mental Status: She is alert and oriented to  person, place, and time. Mental status is at baseline.     No results found for any visits on 06/17/22.    Diabetic Foot Exam - Simple   Simple Foot Form Diabetic Foot exam was performed with the following findings: Yes 06/17/2022  3:12 PM  Visual Inspection No deformities, no ulcerations, no other skin breakdown bilaterally: Yes Sensation Testing Intact to touch and monofilament testing bilaterally: Yes Pulse Check Posterior Tibialis and Dorsalis pulse intact bilaterally: Yes Comments     Assessment & Plan:  Type 2 diabetes mellitus with other specified complication, without long-term current use of insulin (HCC) -Hemoglobin A1c 7.8 percent on 03/18/2022 -Foot exam done this visit -Pt schedule diabetic retinopathy screening -Continue current medications including Jardiance, metformin -     POCT glycosylated hemoglobin (Hb A1C) -     metFORMIN HCl; TAKE 1 TABLET BY MOUTH TWICE A DAY WITH A MEAL  Dispense: 180 tablet; Refill: 1  Essential hypertension -Controlled -Discussed the importance of decreasing sodium intake and reading labels -     Losartan Potassium-HCTZ; Take 1 tablet by mouth daily.  Dispense: 90 tablet; Refill: 1 -     Potassium  Chloride ER; TAKE 1 TABLET BY MOUTH DAILY  Dispense: 90 tablet; Refill: 1  Hypertension associated with diabetes ([place])  Mixed hyperlipidemia -Will obtain lipid panel when patient is fasting at CPE -     Pravastatin Sodium; Take 1 tablet (20 mg total) by mouth daily.  Dispense: 90 tablet; Refill: 1  Herpes Assessment & Plan: Continue Valtrex 500 mg daily.  Orders: -     valACYclovir HCl; Take 1 tablet (500 mg total) by mouth daily.  Dispense: 90 tablet; Refill: 1    Return for physical and labs in the next few wks.   Billie Ruddy, MD

## 2022-06-17 NOTE — Addendum Note (Signed)
Addended byEncarnacion Slates on: 06/17/2022 04:21 PM   Modules accepted: Orders

## 2022-06-17 NOTE — Assessment & Plan Note (Signed)
Continue Valtrex 500 mg daily.

## 2022-06-18 ENCOUNTER — Ambulatory Visit: Payer: 59 | Admitting: Family Medicine

## 2022-06-18 LAB — MICROALBUMIN / CREATININE URINE RATIO
Creatinine,U: 62.3 mg/dL
Microalb Creat Ratio: 1.1 mg/g (ref 0.0–30.0)
Microalb, Ur: <0.7 mg/dL (ref 0.0–1.9)

## 2022-07-14 ENCOUNTER — Encounter: Payer: 59 | Admitting: Family Medicine

## 2022-07-23 ENCOUNTER — Encounter: Payer: 59 | Admitting: Family Medicine

## 2022-07-28 ENCOUNTER — Other Ambulatory Visit: Payer: Self-pay | Admitting: Family Medicine

## 2022-07-29 ENCOUNTER — Ambulatory Visit (INDEPENDENT_AMBULATORY_CARE_PROVIDER_SITE_OTHER): Payer: 59 | Admitting: Family Medicine

## 2022-07-29 ENCOUNTER — Encounter: Payer: Self-pay | Admitting: Family Medicine

## 2022-07-29 VITALS — BP 150/100 | HR 85 | Temp 98.5°F | Ht 66.0 in | Wt 215.2 lb

## 2022-07-29 DIAGNOSIS — L309 Dermatitis, unspecified: Secondary | ICD-10-CM

## 2022-07-29 DIAGNOSIS — E1169 Type 2 diabetes mellitus with other specified complication: Secondary | ICD-10-CM | POA: Diagnosis not present

## 2022-07-29 DIAGNOSIS — E782 Mixed hyperlipidemia: Secondary | ICD-10-CM | POA: Diagnosis not present

## 2022-07-29 DIAGNOSIS — Z0000 Encounter for general adult medical examination without abnormal findings: Secondary | ICD-10-CM | POA: Diagnosis not present

## 2022-07-29 DIAGNOSIS — I10 Essential (primary) hypertension: Secondary | ICD-10-CM

## 2022-07-29 DIAGNOSIS — K219 Gastro-esophageal reflux disease without esophagitis: Secondary | ICD-10-CM

## 2022-07-29 LAB — LIPID PANEL
Cholesterol: 134 mg/dL (ref 0–200)
HDL: 57.6 mg/dL (ref >39.00)
LDL Cholesterol: 66 mg/dL (ref 0–99)
NonHDL: 76.18
Total CHOL/HDL Ratio: 2
Triglycerides: 53 mg/dL (ref 0.0–149.0)
VLDL: 10.6 mg/dL (ref 0.0–40.0)

## 2022-07-29 LAB — COMPREHENSIVE METABOLIC PANEL
ALT: 16 U/L (ref 0–35)
AST: 19 U/L (ref 0–37)
Albumin: 4.1 g/dL (ref 3.5–5.2)
Alkaline Phosphatase: 81 U/L (ref 39–117)
BUN: 11 mg/dL (ref 6–23)
CO2: 26 mEq/L (ref 19–32)
Calcium: 9.3 mg/dL (ref 8.4–10.5)
Chloride: 102 mEq/L (ref 96–112)
Creatinine, Ser: 0.84 mg/dL (ref 0.40–1.20)
GFR: 82.43 mL/min (ref >60.00)
Glucose, Bld: 115 mg/dL — ABNORMAL HIGH (ref 70–99)
Potassium: 3.6 mEq/L (ref 3.5–5.1)
Sodium: 137 mEq/L (ref 135–145)
Total Bilirubin: 0.7 mg/dL (ref 0.2–1.2)
Total Protein: 7.1 g/dL (ref 6.0–8.3)

## 2022-07-29 LAB — TSH: TSH: 0.67 u[IU]/mL (ref 0.35–5.50)

## 2022-07-29 LAB — CBC WITH DIFFERENTIAL/PLATELET
Basophils Absolute: 0 10*3/uL (ref 0.0–0.1)
Basophils Relative: 0.8 % (ref 0.0–3.0)
Eosinophils Absolute: 0.1 10*3/uL (ref 0.0–0.7)
Eosinophils Relative: 1.9 % (ref 0.0–5.0)
HCT: 41.4 % (ref 36.0–46.0)
Hemoglobin: 13.6 g/dL (ref 12.0–15.0)
Lymphocytes Relative: 31.9 % (ref 12.0–46.0)
Lymphs Abs: 1.6 10*3/uL (ref 0.7–4.0)
MCHC: 32.9 g/dL (ref 30.0–36.0)
MCV: 87 fl (ref 78.0–100.0)
Monocytes Absolute: 0.4 10*3/uL (ref 0.1–1.0)
Monocytes Relative: 7.5 % (ref 3.0–12.0)
Neutro Abs: 2.8 10*3/uL (ref 1.4–7.7)
Neutrophils Relative %: 57.9 % (ref 43.0–77.0)
Platelets: 276 10*3/uL (ref 150.0–400.0)
RBC: 4.75 Mil/uL (ref 3.87–5.11)
RDW: 14.1 % (ref 11.5–15.5)
WBC: 4.9 10*3/uL (ref 4.0–10.5)

## 2022-07-29 MED ORDER — LOSARTAN POTASSIUM-HCTZ 50-12.5 MG PO TABS: 1.0000 | ORAL_TABLET | Freq: Every day | ORAL | 3 refills | Status: DC

## 2022-07-29 MED ORDER — POTASSIUM CHLORIDE ER 10 MEQ PO TBCR: EXTENDED_RELEASE_TABLET | ORAL | 3 refills | Status: DC

## 2022-07-29 MED ORDER — METFORMIN HCL 500 MG PO TABS: ORAL_TABLET | ORAL | 3 refills | Status: DC

## 2022-07-29 MED ORDER — JARDIANCE 25 MG PO TABS: 25.0000 mg | ORAL_TABLET | Freq: Every day | ORAL | 3 refills | Status: DC

## 2022-07-29 MED ORDER — OMEPRAZOLE 20 MG PO CPDR: 20.0000 mg | DELAYED_RELEASE_CAPSULE | Freq: Every day | ORAL | 1 refills | Status: DC

## 2022-07-29 MED ORDER — PRAVASTATIN SODIUM 20 MG PO TABS: 20.0000 mg | ORAL_TABLET | Freq: Every day | ORAL | 3 refills | Status: DC

## 2022-07-29 NOTE — Progress Notes (Signed)
Established Patient Office Visit   Subjective  Patient ID: Linda Conrad, female    DOB: 1976/05/11  Age: 47 y.o. 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  Pt states Linda Conrad is doing well overall.  Seen by Payton Mccallum recently for hyperpigmented lesions on upper and lower lip at R corner of mouth.  Pt denies changes in soaps, lotions, detergents, or foods.  States was given a cream that starts with a P to use twice a day.  Just started cream yesterday.  Has follow-up in 2 weeks.  Patient inquires if Linda Conrad needs allergy testing.  Areas are nonpruritic.  Also has similar lesion on left medial thigh.  Patient did not take BP meds this morning as Linda Conrad thought Linda Conrad could not since Linda Conrad was fasting.  Denies headaches, CP, LE edema.  BP typically controlled when on meds.  Requesting refills.  Patient also notes a soreness in the right upper chest just below shoulder.  States sensation feels like a pulling that gets better as the day goes on.  Denies heavy lifting, pushing pulling, sleeping on sides.  Patient does wear purse on right shoulder.  States purse is heavy.       ROS Negative unless stated above    Objective:     BP (!) 150/100 (BP Location: Left Arm, Patient Position: Sitting, Cuff Size: Normal)   Pulse 85   Temp 98.5 F (36.9 C) (Oral)   Ht '5\' 6"'$  (1.676 m)   Wt 215 lb 3.2 oz (97.6 kg)   SpO2 99%   BMI 34.73 kg/m    Physical Exam Constitutional:      Appearance: Normal appearance.  HENT:     Head: Normocephalic and atraumatic.      Right Ear: Tympanic membrane, ear canal and external ear normal.     Left Ear: Tympanic membrane, ear canal and external ear normal.     Nose: Nose normal.     Mouth/Throat:     Mouth: Mucous membranes are moist.     Pharynx: No oropharyngeal exudate or posterior oropharyngeal erythema.  Eyes:     General: No scleral icterus.    Extraocular Movements: Extraocular movements intact.     Conjunctiva/sclera: Conjunctivae normal.     Pupils: Pupils are equal, round, and reactive to light.  Neck:     Thyroid: No thyromegaly.  Cardiovascular:     Rate and Rhythm: Normal rate and regular rhythm.     Pulses: Normal pulses.     Heart sounds: Normal heart sounds. No murmur heard.    No friction rub.  Pulmonary:     Effort: Pulmonary effort is normal.     Breath sounds: Normal breath sounds. No wheezing, rhonchi or rales.  Abdominal:     General: Bowel sounds are normal.     Palpations: Abdomen is soft.     Tenderness: There is no abdominal tenderness.  Musculoskeletal:        General: No deformity. Normal range of motion.  Lymphadenopathy:     Cervical: No cervical adenopathy.  Skin:  General: Skin is warm and dry.     Findings: No lesion.          Comments: Hyperpigmented, dry, flat, circular lesions on R upper and lower lip prximal to corner, and L distal medial thigh  Neurological:     General: No focal deficit present.     Mental Status: Linda Conrad is alert and oriented to person, place, and time.  Psychiatric:        Mood and Affect: Mood normal.        Thought Content: Thought content normal.      No results found for any visits on 07/29/22.    Assessment & Plan:  Well adult exam -Anticipatory guidance given including wearing seatbelts, smoke detectors in the home, increasing physical activity, increasing p.o. intake of water and vegetables. -Labs -Immunizations reviewed and up-to-date -Mammogram done 01/01/2022 -Colonoscopy done 06/19/2020 -Pap done 10/09/2021 with  OB/GYN. -Given handout -Next CPE in 1 year  Type 2 diabetes mellitus with other specified complication, without long-term current use of insulin (HCC) -Hemoglobin A1c 7.8% on 06/17/2022 -Foot exam up-to-date done 06/17/2022 -Diabetic retinopathy screening done 08/04/2021.  Patient has appointment in the next few weeks. -Continue current medications including metformin 500 mg BID, jardiance 25 mg daily -     Jardiance; Take 1 tablet (25 mg total) by mouth daily.  Dispense: 90 tablet; Refill: 3 -     metFORMIN HCl; TAKE 1 TABLET BY MOUTH TWICE A DAY WITH A MEAL  Dispense: 180 tablet; Refill: 3 -     Comprehensive metabolic panel  Essential hypertension -normally controlled. -uncontrolled this visit as pt has not taken meds this am.  Pt advised to take meds after OFV and monitor bp. -continue losartan-HCTZ 50-12.5 mg and klor-con 10 mEq daily -lifestyle modifications -adjust dose of med if bp consistently >140/90. -     Losartan Potassium-HCTZ; Take 1 tablet by mouth daily.  Dispense: 90 tablet; Refill: 3 -     Potassium Chloride ER; TAKE 1 TABLET BY MOUTH DAILY  Dispense: 90 tablet; Refill: 3 -     Comprehensive metabolic panel -     CBC with Differential/Platelet -     Lipid panel -     TSH  Mixed hyperlipidemia -lifestyle modifications -     Pravastatin Sodium; Take 1 tablet (20 mg total) by mouth daily.  Dispense: 90 tablet; Refill: 3 -     Comprehensive metabolic panel -     Lipid panel  Dermatitis -pt advised to use cream from recent derm visit. -if rash persist at upcoming 2-week follow-up inquire about biopsy. -Would pursue biopsy first and then allergy testing if needed.  Advised patient will need to be off antihistamines prior to having allergy testing done. -     CBC with Differential/Platelet  Gastroesophageal reflux disease, unspecified whether esophagitis present -     Omeprazole; Take 1 capsule (20 mg total) by mouth daily.  Dispense: 90 capsule; Refill: 1    Return  in about 4 months (around 11/27/2022), or if symptoms worsen or fail to improve.   Billie Ruddy, MD

## 2022-09-06 ENCOUNTER — Encounter: Payer: Self-pay | Admitting: Family Medicine

## 2022-09-06 ENCOUNTER — Ambulatory Visit: Payer: 59 | Admitting: Family Medicine

## 2022-09-06 VITALS — BP 130/84 | HR 82 | Temp 98.7°F | Wt 209.2 lb

## 2022-09-06 DIAGNOSIS — M545 Low back pain, unspecified: Secondary | ICD-10-CM | POA: Diagnosis not present

## 2022-09-06 DIAGNOSIS — Z87442 Personal history of urinary calculi: Secondary | ICD-10-CM | POA: Diagnosis not present

## 2022-09-06 DIAGNOSIS — R350 Frequency of micturition: Secondary | ICD-10-CM

## 2022-09-06 LAB — POC URINALSYSI DIPSTICK (AUTOMATED)
Bilirubin, UA: NEGATIVE
Blood, UA: NEGATIVE
Glucose, UA: POSITIVE — AB
Ketones, UA: NEGATIVE
Leukocytes, UA: NEGATIVE
Nitrite, UA: NEGATIVE
Protein, UA: NEGATIVE
Spec Grav, UA: 1.015 (ref 1.010–1.025)
Urobilinogen, UA: 0.2 E.U./dL
pH, UA: 5.5 (ref 5.0–8.0)

## 2022-09-06 MED ORDER — TAMSULOSIN HCL 0.4 MG PO CAPS
0.4000 mg | ORAL_CAPSULE | Freq: Every day | ORAL | 0 refills | Status: DC
Start: 2022-09-06 — End: 2022-10-04

## 2022-09-06 MED ORDER — TRAMADOL HCL 50 MG PO TABS: 50.0000 mg | ORAL_TABLET | Freq: Three times a day (TID) | ORAL | 0 refills | Status: AC | PRN

## 2022-09-06 NOTE — Progress Notes (Signed)
Established Patient Office Visit   Subjective  Patient ID: Linda Conrad, female    DOB: 08-13-1975  Age: 47 y.o. MRN: [id number]  No chief complaint on file.   Pt is a 47 year old female with history of arthritis, anemia, DM2, abnormal Pap, GERD, HSV 1, HLD, renal calculi, HTN who presents for acute on chronic symptoms.  With R sided low back pain.  Symptoms worse in the last day or so.  Turning is painful.  Walking hurts.  Pain is described as sharp, but can flare up.  Pt states symptoms feel like when she had kidney stones 2 years ago.  Symptoms from that time have been continuous with intermittent flares.  Patient also notes urinary frequency.  Denies dysuria, vaginal irritation, hematuria, nausea, vomiting, constipation, diarrhea.    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   Anemia 08/07/2017   Hx of abnormal cervical Pap smear 08/01/2015   Type 2 diabetes mellitus with other specified complication 09/13/2014   Hypertension associated with diabetes 11/16/2013   GERD (gastroesophageal reflux disease) 11/16/2013   Hearing loss - followed by [facility] 11/16/2013   Past Surgical History:  Procedure Laterality Date   CYST EXCISION     right thumb   DORSAL COMPARTMENT RELEASE  08/26/2011   Procedure: RELEASE DORSAL COMPARTMENT (DEQUERVAIN);  Surgeon: Wyn Forster., MD;  Location: [facility];  Service: Orthopedics;  Laterality: Right;   FOREIGN BODY REMOVAL  08/26/2011   Procedure: FOREIGN BODY REMOVAL ADULT;  Surgeon: Wyn Forster., MD;  Location: [facility];  Service: Orthopedics;  Laterality: Right;   KNEE ARTHROSCOPY Right 06/10/2021   Social History   Tobacco Use   Smoking  status: Never   Smokeless tobacco: Never  Vaping Use   Vaping Use: Never used  Substance Use Topics   Alcohol use: Not Currently   Drug use: No   Family History  Problem Relation Age of Onset   Hyperlipidemia Mother    Hypertension Mother    Hypertension Father    Stroke Father    Hypertension Sister    Diabetes Sister    Arthritis Maternal Grandmother    Kidney failure Maternal Grandmother    Heart failure Maternal Grandmother    Heart failure Maternal Grandfather    Diabetes Maternal Grandfather    Breast cancer Paternal Grandmother        Age 71s or 107s   Diabetes Mellitus II Paternal Grandmother    Dementia Paternal Grandmother    Throat cancer Paternal Grandmother    Stroke Paternal Grandfather    Cancer Paternal Aunt        bladder or kidney cancer, Age 71/58    Ovarian cancer Cousin 30       maternal uncle's daughter   Colon cancer Other        maternal great-aunt, in her 53s   Ovarian cancer Other        matrenal great-aunt, in her 37s   Esophageal cancer Neg Hx    Stomach cancer Neg Hx    Rectal cancer Neg Hx    Allergies  Allergen Reactions   Codeine Other (See Comments)  Family hx of allergy Family hx of allergy   Oak Bark [Quercus Robur]     Oak trees   Soap Rash    Everything except Dove      ROS Negative unless stated above    Objective:     BP 130/84 (BP Location: Left Arm, Patient Position: Sitting, Cuff Size: Large)   Pulse 82   Temp 98.7 F (37.1 C) (Oral)   Wt 209 lb 3.2 oz (94.9 kg)   SpO2 99%   BMI 33.77 kg/m    Physical Exam Constitutional:      General: She is not in acute distress.    Appearance: Normal appearance.  HENT:     Head: Normocephalic and atraumatic.     Nose: Nose normal.     Mouth/Throat:     Mouth: Mucous membranes are moist.  Eyes:     Extraocular Movements: Extraocular movements intact.     Conjunctiva/sclera: Conjunctivae normal.     Pupils: Pupils are equal, round, and reactive to light.   Cardiovascular:     Rate and Rhythm: Normal rate and regular rhythm.     Heart sounds: Normal heart sounds. No murmur heard.    No gallop.  Pulmonary:     Effort: Pulmonary effort is normal. No respiratory distress.     Breath sounds: Normal breath sounds. No wheezing, rhonchi or rales.  Abdominal:     Palpations: Abdomen is soft.     Tenderness: There is no abdominal tenderness. There is no right CVA tenderness, left CVA tenderness or guarding.  Musculoskeletal:       Back:     Comments: No TTP of cervical, thoracic, lumbar spine midline.  TTP of right lateral low back.  No CVA tenderness b/l.  Skin:    General: Skin is warm and dry.  Neurological:     Mental Status: She is alert and oriented to person, place, and time.     Comments: Ambulating slower and slightly hunched forward.      Results for orders placed or performed in visit on 09/06/22  POCT Urinalysis Dipstick (Automated)  Result Value Ref Range   Color, UA yellow    Clarity, UA clear    Glucose, UA Positive (A) Negative   Bilirubin, UA neg    Ketones, UA neg    Spec Grav, UA 1.015 1.010 - 1.025   Blood, UA neg    pH, UA 5.5 5.0 - 8.0   Protein, UA Negative Negative   Urobilinogen, UA 0.2 0.2 or 1.0 E.U./dL   Nitrite, UA neg    Leukocytes, UA Negative Negative      Assessment & Plan:  Right low back pain, unspecified chronicity, unspecified whether sciatica present -     POCT Urinalysis Dipstick (Automated) -     CT RENAL STONE STUDY; Future -     Tamsulosin HCl; Take 1 capsule (0.4 mg total) by mouth daily.  Dispense: 30 capsule; Refill: 0 -     traMADol HCl; Take 1 tablet (50 mg total) by mouth every 8 (eight) hours as needed for up to 5 days.  Dispense: 15 tablet; Refill: 0  History of renal calculi -     CT RENAL STONE STUDY; Future -     Tamsulosin HCl; Take 1 capsule (0.4 mg total) by mouth daily.  Dispense: 30 capsule; Refill: 0  Urinary frequency -     CT RENAL STONE STUDY; Future  Discussed  obtaining CT stone study as symptoms similar to  prior episodes of renal calculi.  UA largely negative.  Glucose likely 2/2 use of Jardiance.  Increase p.o. intake of fluids.  Flomax and tramadol as needed.  Given hat and strainer to collect stones if any passed.  Given strict precautions for worsening symptoms  Return if symptoms worsen or fail to improve.   Deeann Saint, MD

## 2022-09-07 ENCOUNTER — Encounter (HOSPITAL_BASED_OUTPATIENT_CLINIC_OR_DEPARTMENT_OTHER): Payer: Self-pay

## 2022-09-07 ENCOUNTER — Ambulatory Visit (HOSPITAL_BASED_OUTPATIENT_CLINIC_OR_DEPARTMENT_OTHER)
Admission: RE | Admit: 2022-09-07 | Discharge: 2022-09-07 | Disposition: A | Discharge status: Home / Self Care | Payer: 59 | Source: Ambulatory Visit | Attending: Family Medicine | Admitting: Family Medicine

## 2022-09-07 DIAGNOSIS — Z87442 Personal history of urinary calculi: Secondary | ICD-10-CM

## 2022-09-07 DIAGNOSIS — M545 Low back pain, unspecified: Secondary | ICD-10-CM | POA: Insufficient documentation

## 2022-09-07 DIAGNOSIS — R350 Frequency of micturition: Secondary | ICD-10-CM | POA: Diagnosis present

## 2022-09-09 ENCOUNTER — Telehealth: Payer: Self-pay | Admitting: Family Medicine

## 2022-09-09 NOTE — Telephone Encounter (Signed)
Pt is calling and has viewed ct scan results and would like a callback  and to see what is the next step

## 2022-09-13 ENCOUNTER — Other Ambulatory Visit: Payer: Self-pay

## 2022-09-13 DIAGNOSIS — Z87442 Personal history of urinary calculi: Secondary | ICD-10-CM

## 2022-09-13 DIAGNOSIS — M545 Low back pain, unspecified: Secondary | ICD-10-CM

## 2022-09-13 DIAGNOSIS — R350 Frequency of micturition: Secondary | ICD-10-CM

## 2022-09-15 NOTE — Telephone Encounter (Signed)
Refer to result.

## 2022-09-23 ENCOUNTER — Other Ambulatory Visit: Payer: Self-pay

## 2022-09-23 DIAGNOSIS — N926 Irregular menstruation, unspecified: Secondary | ICD-10-CM

## 2022-09-23 DIAGNOSIS — Z3041 Encounter for surveillance of contraceptive pills: Secondary | ICD-10-CM

## 2022-09-23 MED ORDER — NORETHINDRONE 0.35 MG PO TABS: ORAL_TABLET | ORAL | 0 refills | Status: DC

## 2022-09-23 NOTE — Telephone Encounter (Signed)
Last AEX 10/05/2021--scheduled for 11/01/2022 Last mammo 01/01/2022-neg birads 1  Rx pend.

## 2022-10-02 ENCOUNTER — Other Ambulatory Visit: Payer: Self-pay | Admitting: Family Medicine

## 2022-10-02 DIAGNOSIS — Z87442 Personal history of urinary calculi: Secondary | ICD-10-CM

## 2022-10-02 DIAGNOSIS — M545 Low back pain, unspecified: Secondary | ICD-10-CM

## 2022-10-18 NOTE — Progress Notes (Signed)
47 y.o. G0P0000 Single African American female here for annual exam and pap repeat.    Doing well with her Micronor.   Desires STD screening.   She is having tx for renal stone this week. She has right sided pain and a left renal stone noted on the CT scan.  No dysuria or blood in her urine today.  She states she was dx with fibroids on her CT scan done on 09/07/22.  Prior pelvic US and CT have suggested a fibroid or fibroids.   Taking Valtrex possibly, through her ENT, for painful nasal lesions.   She will see a new dermatologist for a skin change around her mouth.   PCP:  Abbe Amsterdam, MD  Patient's last menstrual period was 10/25/2022 (approximate).     Period Duration (Days): 5 Period Pattern: Regular Menstrual Flow: Light Menstrual Control: Thin pad Menstrual Control Change Freq (Hours): 8 Dysmenorrhea: (!) Mild Dysmenorrhea Symptoms: Cramping     Sexually active: Yes.    The current method of family planning is progesterone only birth control.   Exercising: Yes.     Cycling, water areobics Smoker:  no  Health Maintenance: Pap:  10/05/21 negative/neg HR HPV, 10/01/20 ASCUS: HR HPV positive  History of abnormal Pap:  yes MMG:  01/01/22 Breast Density Category B, BI-RADS CATEGORY 1 Negative  Colonoscopy:  06/19/20 BMD:   n/a  Result  n/a TDaP:  03/19/13 Gardasil:   yes HIV:10/05/21 NR Hep C: 10/05/21 NR Screening Labs:  PCP.   reports that she has never smoked. She has never used smokeless tobacco. She reports that she does not currently use alcohol. She reports that she does not use drugs.  Past Medical History:  Diagnosis Date   Abnormal Pap smear of cervix - hpv per report in 2014, seeing gyn 11/16/2013   normal pap with gyn 2015   Anemia    Arthritis    hand   CIN I (cervical intraepithelial neoplasia I) 2020   Diabetes mellitus without complication (HCC)    Family history of bladder cancer    Family history of breast cancer    Family history of kidney cancer     Family history of ovarian cancer    Family history of prostate cancer    Foreign body of hand, right 07/2011   right MP   GERD (gastroesophageal reflux disease)    Headache(784.0)    sinus   HSV-1 (herpes simplex virus 1) infection    Hyperlipidemia    Hypertension    denies HTN, but takes Hyzaar    Kidney stones    Papanicolaou smear of cervix with positive high risk human papilloma virus (HPV) test 2021   Needs cervical cancer screenig in 2022   Stenosing tenosynovitis of wrist 07/2011   right    Past Surgical History:  Procedure Laterality Date   CYST EXCISION     right thumb   DORSAL COMPARTMENT RELEASE  08/26/2011   Procedure: RELEASE DORSAL COMPARTMENT (DEQUERVAIN);  Surgeon: Wyn Forster., MD;  Location: [facility];  Service: Orthopedics;  Laterality: Right;   FOREIGN BODY REMOVAL  08/26/2011   Procedure: FOREIGN BODY REMOVAL ADULT;  Surgeon: Wyn Forster., MD;  Location: [facility];  Service: Orthopedics;  Laterality: Right;   KNEE ARTHROSCOPY Right 06/10/2021    Current Outpatient Medications  Medication Sig Dispense Refill   acetaminophen (TYLENOL) 650 MG CR tablet Take 650 mg by mouth every 8 (eight) hours as needed for  pain.     Ascorbic Acid (VITAMIN C PO) Take 750 mg by mouth daily.     blood glucose meter kit and supplies KIT Dispense based on patient and insurance preference. Use up to three times daily as directed. (FOR ICD-9 250.00, 250.01). 1 each 0   CINNAMON PO Take by mouth.     cyanocobalamin (VITAMIN B12) 1000 MCG tablet Take by mouth.     ELDERBERRY PO Take by mouth.     ferrous sulfate 325 (65 FE) MG EC tablet Take 325 mg by mouth daily.     fluticasone (FLONASE) 50 MCG/ACT nasal spray Place into both nostrils daily.     IBUPROFEN PO Take by mouth as needed. Knee pain     JARDIANCE 25 MG TABS tablet Take 1 tablet (25 mg total) by mouth daily. 90 tablet 3   Lancets (ONETOUCH DELICA PLUS LANCET33G) MISC USE AS  DIRECTED TO TEST BLOOD SUGAR 1-3 TIMES PER DAY 100 each 2   losartan-hydrochlorothiazide (HYZAAR) 50-12.5 MG tablet Take 1 tablet by mouth daily. 90 tablet 3   metFORMIN (GLUCOPHAGE) 500 MG tablet TAKE 1 TABLET BY MOUTH TWICE A DAY WITH A MEAL 180 tablet 3   mometasone (ELOCON) 0.1 % cream Apply 1 application topically daily. 45 g 1   Multiple Vitamins-Minerals (ZINC PO) Take by mouth.     Omega-3 Fatty Acids (FISH OIL) 1000 MG CAPS Take by mouth.     omeprazole (PRILOSEC) 20 MG capsule Take 1 capsule (20 mg total) by mouth daily. 90 capsule 1   pimecrolimus (ELIDEL) 1 % cream Apply topically.     potassium chloride (KLOR-CON) 10 MEQ tablet TAKE 1 TABLET BY MOUTH DAILY 90 tablet 3   pravastatin (PRAVACHOL) 20 MG tablet Take 1 tablet (20 mg total) by mouth daily. 90 tablet 3   SALINE NASAL SPRAY NA Place into the nose as needed.     triamcinolone cream (KENALOG) 0.1 % Apply topically daily. 30 g 1   TURMERIC CURCUMIN PO Take by mouth.     valACYclovir (VALTREX) 500 MG tablet Take 1 tablet (500 mg total) by mouth daily. 90 tablet 1   zinc gluconate 50 MG tablet Take 50 mg by mouth daily.     [employer] SUNSCREEN SPF 30 EX apply topically to face and body daily for 30     FOLIC ACID PO Folic Acid     norethindrone (MICRONOR) 0.35 MG tablet TAKE 1 TABLET(0.35 MG) BY MOUTH DAILY 84 tablet 3   tamsulosin (FLOMAX) 0.4 MG CAPS capsule TAKE 1 CAPSULE BY MOUTH DAILY (Patient not taking: Reported on 11/01/2022) 30 capsule 1   No current facility-administered medications for this visit.    Family History  Problem Relation Age of Onset   Hyperlipidemia Mother    Hypertension Mother    Hypertension Father    Stroke Father    Hypertension Sister    Diabetes Sister    Arthritis Maternal Grandmother    Kidney failure Maternal Grandmother    Heart failure Maternal Grandmother    Heart failure Maternal Grandfather    Diabetes Maternal Grandfather    Breast cancer Paternal Grandmother        Age 8s or 52s    Diabetes Mellitus II Paternal Grandmother    Dementia Paternal Grandmother    Throat cancer Paternal Grandmother    Stroke Paternal Grandfather    Cancer Paternal Aunt        bladder or kidney cancer, Age 52/58    Ovarian  cancer Cousin 59       maternal uncle's daughter   Colon cancer Other        maternal great-aunt, in her 66s   Ovarian cancer Other        matrenal great-aunt, in her 80s   Esophageal cancer Neg Hx    Stomach cancer Neg Hx    Rectal cancer Neg Hx     Review of Systems  All other systems reviewed and are negative.   Exam:   BP 132/78 (BP Location: Right Arm, Patient Position: Sitting)   Pulse 92   Resp 20   Ht 5' 5.16" (1.655 m)   Wt 206 lb 12.8 oz (93.8 kg)   LMP 10/25/2022 (Approximate)   SpO2 98%   BMI 34.25 kg/m     General appearance: alert, cooperative and appears stated age Head: normocephalic, without obvious abnormality, atraumatic Neck: no adenopathy, supple, symmetrical, trachea midline and thyroid normal to inspection and palpation Lungs: clear to auscultation bilaterally Breasts: normal appearance, no masses or tenderness, No nipple retraction or dimpling, No nipple discharge or bleeding, No axillary adenopathy Heart: regular rate and rhythm Abdomen: soft, non-tender; no masses, no organomegaly Extremities: extremities normal, atraumatic, no cyanosis or edema Skin: skin color, texture, turgor normal. No rashes or lesions Lymph nodes: cervical, supraclavicular, and axillary nodes normal. Neurologic: grossly normal  Pelvic: External genitalia:  no lesions              No abnormal inguinal nodes palpated.              Urethra:  normal appearing urethra with no masses, tenderness or lesions              Bartholins and Skenes: normal                 Vagina: normal appearing vagina with normal color and discharge, no lesions              Cervix: no lesions              Pap taken: yes Bimanual Exam:  Uterus:  normal size, contour, position,  consistency, mobility, non-tender              Adnexa: no mass, fullness, tenderness              Rectal exam: yes.  Confirms.              Anus:  normal sphincter tone, no lesions  Chaperone was present for exam:  Chapman Fitch, RN  Assessment:   Well woman visit with gynecologic exam. Hx prior positive LGSIL and positive HR HPV.  Uterine fibroid. Hx HSV 1.  PCP currently prescribing.  DM. FH cancers.  Negative genetic testing.   Variants of unknown significance noted.  Right sided back pain.  Left renal stone.  Plan: Mammogram screening discussed. Self breast awareness reviewed. Pap and HR HPV collected. Guidelines for Calcium, Vitamin D, regular exercise program including cardiovascular and weight bearing exercise. Previous CT scan and [place] reports reviewed with patient for 2021 and 2024.  Refill of Micronor for one year. STD screening.  TDap. She will contact her urologist today regarding the discrepancy between the side of her back pain and the presence of a left renal stone.  Follow up annually and prn.

## 2022-10-19 ENCOUNTER — Other Ambulatory Visit: Payer: Self-pay | Admitting: Family Medicine

## 2022-10-19 DIAGNOSIS — Z1231 Encounter for screening mammogram for malignant neoplasm of breast: Secondary | ICD-10-CM

## 2022-11-01 ENCOUNTER — Other Ambulatory Visit (HOSPITAL_COMMUNITY)
Admission: RE | Admit: 2022-11-01 | Discharge: 2022-11-01 | Disposition: A | Discharge status: Home / Self Care | Payer: 59 | Source: Ambulatory Visit | Attending: Obstetrics and Gynecology | Admitting: Obstetrics and Gynecology

## 2022-11-01 ENCOUNTER — Ambulatory Visit (INDEPENDENT_AMBULATORY_CARE_PROVIDER_SITE_OTHER): Payer: 59 | Admitting: Obstetrics and Gynecology

## 2022-11-01 ENCOUNTER — Encounter: Payer: Self-pay | Admitting: Obstetrics and Gynecology

## 2022-11-01 VITALS — BP 132/78 | HR 92 | Resp 20 | Ht 65.16 in | Wt 206.8 lb

## 2022-11-01 DIAGNOSIS — Z01419 Encounter for gynecological examination (general) (routine) without abnormal findings: Secondary | ICD-10-CM | POA: Diagnosis not present

## 2022-11-01 DIAGNOSIS — Z113 Encounter for screening for infections with a predominantly sexual mode of transmission: Secondary | ICD-10-CM | POA: Insufficient documentation

## 2022-11-01 DIAGNOSIS — Z23 Encounter for immunization: Secondary | ICD-10-CM | POA: Diagnosis not present

## 2022-11-01 DIAGNOSIS — Z124 Encounter for screening for malignant neoplasm of cervix: Secondary | ICD-10-CM | POA: Insufficient documentation

## 2022-11-01 DIAGNOSIS — Z87410 Personal history of cervical dysplasia: Secondary | ICD-10-CM | POA: Insufficient documentation

## 2022-11-01 DIAGNOSIS — Z1159 Encounter for screening for other viral diseases: Secondary | ICD-10-CM

## 2022-11-01 DIAGNOSIS — Z3041 Encounter for surveillance of contraceptive pills: Secondary | ICD-10-CM | POA: Diagnosis not present

## 2022-11-01 DIAGNOSIS — Z114 Encounter for screening for human immunodeficiency virus [HIV]: Secondary | ICD-10-CM

## 2022-11-01 MED ORDER — NORETHINDRONE 0.35 MG PO TABS: ORAL_TABLET | ORAL | 3 refills | Status: DC

## 2022-11-01 NOTE — Patient Instructions (Signed)

## 2022-11-02 LAB — HEPATITIS C ANTIBODY: Hepatitis C Ab: NONREACTIVE

## 2022-11-02 LAB — HIV ANTIBODY (ROUTINE TESTING W REFLEX): HIV 1&2 Ab, 4th Generation: NONREACTIVE

## 2022-11-02 LAB — RPR: RPR Ser Ql: NONREACTIVE

## 2022-11-04 LAB — CYTOLOGY - PAP
Chlamydia: NEGATIVE
Comment: NEGATIVE
Comment: NEGATIVE
Comment: NEGATIVE
Comment: NORMAL
Diagnosis: NEGATIVE
High risk HPV: NEGATIVE
Neisseria Gonorrhea: NEGATIVE
Trichomonas: NEGATIVE

## 2022-11-22 ENCOUNTER — Ambulatory Visit (INDEPENDENT_AMBULATORY_CARE_PROVIDER_SITE_OTHER): Payer: 59 | Admitting: Family Medicine

## 2022-11-22 ENCOUNTER — Encounter: Payer: Self-pay | Admitting: Family Medicine

## 2022-11-22 VITALS — BP 128/88 | HR 105 | Temp 98.4°F | Wt 204.0 lb

## 2022-11-22 DIAGNOSIS — D219 Benign neoplasm of connective and other soft tissue, unspecified: Secondary | ICD-10-CM

## 2022-11-22 DIAGNOSIS — N200 Calculus of kidney: Secondary | ICD-10-CM | POA: Diagnosis not present

## 2022-11-22 DIAGNOSIS — K7689 Other specified diseases of liver: Secondary | ICD-10-CM | POA: Diagnosis not present

## 2022-11-22 DIAGNOSIS — G8929 Other chronic pain: Secondary | ICD-10-CM

## 2022-11-22 DIAGNOSIS — N62 Hypertrophy of breast: Secondary | ICD-10-CM

## 2022-11-22 DIAGNOSIS — M545 Low back pain, unspecified: Secondary | ICD-10-CM | POA: Diagnosis not present

## 2022-11-22 NOTE — Patient Instructions (Addendum)
The imaging report says that there are multiple stable hepatic cyst.  The actual size of cyst is not mentioned.  This would be something to continue monitoring as your liver function has been normal.  For simple liver cysts, no further imaging is needed unless you start having symptoms.  Your spleen is fine.   The referral was placed for physical therapy.  You should expect a phone call about scheduling this appointment.

## 2022-11-22 NOTE — Progress Notes (Signed)
Established Patient Office Visit   Subjective  Patient ID: Linda Conrad, female    DOB: 1976-03-04  Age: 47 y.o. MRN: [id number]  Chief Complaint  Patient presents with   Medical Management of Chronic Issues    Specialist did not think the kidney stone needed to be blasted, stated it could be spleen or just muscle pain.     Pt is a 47 yo female seen for f/u.  Continued R sided upper lumbar low back pain x months.  Pain occurs with movement.  Not as intense as sharp stabbing pain noted in early 2024 when a small left sided kidney stone was noted on imaging.  Urology does not feel stone is causing the back pain.  Pt inquires about chiropractor.  Patient denies edema of the back, tightness in the muscles/spasming.  Patient does a lot of walking during the day at work, also swims and cycles without issue.  In pt sitting at desk sits on edge of chair and leans forward.  Patient endorses larger bra size, triple C?  Bra strap digs in shoulders and rides up in the back.  Denies foot, knee, hip pain.  Patient inquires about further details of CT stone study.  Inquires about what she needs to do for her spleen.  Upon review of results pt spleen was fine.  Stable hepatic cysts noted.  Pt made her OB/Gyn aware of Fibroid that was also noted.  Will continue to monitor.    Past Medical History:  Diagnosis Date   Abnormal Pap smear of cervix - hpv per report in 2014, seeing gyn 11/16/2013   normal pap with gyn 2015   Anemia    Arthritis    hand   CIN I (cervical intraepithelial neoplasia I) 2020   Diabetes mellitus without complication (HCC)    Family history of bladder cancer    Family history of breast cancer    Family history of kidney cancer    Family history of ovarian cancer    Family history of prostate cancer    Foreign body of hand, right 07/2011   right MP   GERD (gastroesophageal reflux disease)    Headache(784.0)    sinus   HSV-1 (herpes simplex virus 1) infection     Hyperlipidemia    Hypertension    denies HTN, but takes Hyzaar    Kidney stones    Papanicolaou smear of cervix with positive high risk human papilloma virus (HPV) test 2021   Needs cervical cancer screenig in 2022   Stenosing tenosynovitis of wrist 07/2011   right   Past Surgical History:  Procedure Laterality Date   CYST EXCISION     right thumb   DORSAL COMPARTMENT RELEASE  08/26/2011   Procedure: RELEASE DORSAL COMPARTMENT (DEQUERVAIN);  Surgeon: Wyn Forster., MD;  Location: [facility];  Service: Orthopedics;  Laterality: Right;   FOREIGN BODY REMOVAL  08/26/2011   Procedure: FOREIGN BODY REMOVAL ADULT;  Surgeon: Wyn Forster., MD;  Location: [facility];  Service: Orthopedics;  Laterality: Right;   KNEE ARTHROSCOPY Right 06/10/2021   Social History   Tobacco Use   Smoking status: Never   Smokeless tobacco: Never  Vaping Use   Vaping Use: Never used  Substance Use Topics   Alcohol use: Not Currently   Drug use: No   Family History  Problem Relation Age of Onset   Hyperlipidemia Mother    Hypertension Mother    Hypertension Father  Stroke Father    Hypertension Sister    Diabetes Sister    Arthritis Maternal Grandmother    Kidney failure Maternal Grandmother    Heart failure Maternal Grandmother    Heart failure Maternal Grandfather    Diabetes Maternal Grandfather    Breast cancer Paternal Grandmother        Age 64s or 38s   Diabetes Mellitus II Paternal Grandmother    Dementia Paternal Grandmother    Throat cancer Paternal Grandmother    Stroke Paternal Grandfather    Cancer Paternal Aunt        bladder or kidney cancer, Age 71/58    Ovarian cancer Cousin 41       maternal uncle's daughter   Colon cancer Other        maternal great-aunt, in her 40s   Ovarian cancer Other        matrenal great-aunt, in her 35s   Esophageal cancer Neg Hx    Stomach cancer Neg Hx    Rectal cancer Neg Hx    Allergies  Allergen  Reactions   Codeine Other (See Comments)    Family hx of allergy   Oak Bark [Quercus Robur]     Oak trees   Soap Rash    Everything except Dove      ROS Negative unless stated above    Objective:     BP 128/88 (BP Location: Right Arm, Patient Position: Sitting, Cuff Size: Large)   Pulse (!) 105   Temp 98.4 F (36.9 C) (Oral)   Wt 204 lb (92.5 kg)   LMP 10/25/2022 (Approximate)   SpO2 98%   BMI 33.78 kg/m  BP Readings from Last 3 Encounters:  11/22/22 128/88  11/01/22 132/78  09/06/22 130/84   Wt Readings from Last 3 Encounters:  11/22/22 204 lb (92.5 kg)  11/01/22 206 lb 12.8 oz (93.8 kg)  09/06/22 209 lb 3.2 oz (94.9 kg)      Physical Exam Constitutional:      General: She is not in acute distress.    Appearance: Normal appearance.  HENT:     Head: Normocephalic and atraumatic.     Nose: Nose normal.     Mouth/Throat:     Mouth: Mucous membranes are moist.  Cardiovascular:     Rate and Rhythm: Normal rate.     Heart sounds: No murmur heard.    No gallop.  Pulmonary:     Effort: Pulmonary effort is normal. No respiratory distress.     Breath sounds: No wheezing, rhonchi or rales.  Musculoskeletal:       Arms:     Comments: TTP of R lateral low back at upper lumbar/lower thoracic area.  No rash or skin changes noted on back.  Left shoulder slightly lower than the right when sitting upright.  Hips even with ambulation.  Skin:    General: Skin is warm and dry.     Findings: No rash.  Neurological:     Mental Status: She is alert and oriented to person, place, and time.      No results found for any visits on 11/22/22.    Assessment & Plan:  Chronic right-sided low back pain without sciatica -     Ambulatory referral to Physical Therapy  Hepatic cyst  Large breasts  Renal calculi  Fibroid  Back pain due to muscle skeletal strain seemingly multifactorial.  Initially renal calculus thought to be contributing but less so now based on  description of current  pain.  Posture and large breast size current factors.  Discussed appropriate bra size as back strap of current bra sits at the level of upper thoracic spine not on mid back and is digging into shoulders.  CT stone study from 09/07/2022 without acute or significant osseous findings.  Incidental finding of stable multiple hepatic cysts noted on CT stone study 09/07/2022, no cholelithiasis or biliary dilation.Marland Kitchen  LFTs normal on 07/29/2022.  Continue to monitor as patient is currently asymptomatic.  Incidental finding of a 3.4 cm fibroid also noted.  Continue follow-up with OB/GYN to monitor.   Return in 3 months (on 02/22/2023), or if symptoms worsen or fail to improve.   Deeann Saint, MD

## 2023-01-03 ENCOUNTER — Ambulatory Visit: Payer: 59 | Admitting: Family Medicine

## 2023-01-04 ENCOUNTER — Ambulatory Visit
Admission: RE | Admit: 2023-01-04 | Discharge: 2023-01-04 | Disposition: A | Discharge status: Home / Self Care | Payer: 59 | Source: Ambulatory Visit | Attending: Family Medicine | Admitting: Family Medicine

## 2023-01-04 DIAGNOSIS — Z1231 Encounter for screening mammogram for malignant neoplasm of breast: Secondary | ICD-10-CM

## 2023-01-07 ENCOUNTER — Other Ambulatory Visit: Payer: Self-pay | Admitting: Family Medicine

## 2023-01-07 DIAGNOSIS — R928 Other abnormal and inconclusive findings on diagnostic imaging of breast: Secondary | ICD-10-CM

## 2023-01-18 ENCOUNTER — Ambulatory Visit: Payer: 59

## 2023-01-18 ENCOUNTER — Ambulatory Visit
Admission: RE | Admit: 2023-01-18 | Discharge: 2023-01-18 | Disposition: A | Discharge status: Home / Self Care | Payer: 59 | Source: Ambulatory Visit | Attending: Family Medicine | Admitting: Family Medicine

## 2023-01-18 DIAGNOSIS — R928 Other abnormal and inconclusive findings on diagnostic imaging of breast: Secondary | ICD-10-CM

## 2023-01-25 ENCOUNTER — Ambulatory Visit: Payer: 59

## 2023-03-17 ENCOUNTER — Ambulatory Visit: Payer: 59 | Admitting: Family Medicine

## 2023-03-18 ENCOUNTER — Telehealth: Payer: Self-pay | Admitting: Family Medicine

## 2023-03-18 ENCOUNTER — Ambulatory Visit (INDEPENDENT_AMBULATORY_CARE_PROVIDER_SITE_OTHER): Payer: 59 | Admitting: Family Medicine

## 2023-03-18 VITALS — BP 110/86 | HR 97 | Temp 98.2°F | Ht 65.16 in | Wt 196.6 lb

## 2023-03-18 DIAGNOSIS — I10 Essential (primary) hypertension: Secondary | ICD-10-CM

## 2023-03-18 DIAGNOSIS — Z87442 Personal history of urinary calculi: Secondary | ICD-10-CM

## 2023-03-18 DIAGNOSIS — Z7984 Long term (current) use of oral hypoglycemic drugs: Secondary | ICD-10-CM | POA: Diagnosis not present

## 2023-03-18 DIAGNOSIS — E782 Mixed hyperlipidemia: Secondary | ICD-10-CM

## 2023-03-18 DIAGNOSIS — E1169 Type 2 diabetes mellitus with other specified complication: Secondary | ICD-10-CM

## 2023-03-18 DIAGNOSIS — Z23 Encounter for immunization: Secondary | ICD-10-CM

## 2023-03-18 LAB — POCT GLYCOSYLATED HEMOGLOBIN (HGB A1C): Hemoglobin A1C: 12.8 % — AB (ref 4.0–5.6)

## 2023-03-18 NOTE — Progress Notes (Unsigned)
   Established Patient Office Visit   Subjective  Patient ID: Linda Conrad, female    DOB: [birth year]  Age: 47 y.o. MRN: [id number]  Chief Complaint  Patient presents with   Medical Management of Chronic Issues    Pt is here for her follow up and blood work, flu shot,  pneumonia shot.      Patient is a 48 year old female seen for follow-up.  Patient states she scheduled appointment as she was not sure if she needed blood work and vaccines updated.  Patient notes BP on Wednesday was 128/70.  Notes occasional elevation with systolic in the 150s and diastolic 90-100.    {History (Optional):23778}  ROS Negative unless stated above    Objective:     BP (!) 140/100 (BP Location: Right Arm, Patient Position: Sitting, Cuff Size: Normal)   Pulse 97   Temp 98.2 F (36.8 C) (Oral)   Ht 5' 5.16" (1.655 m)   Wt 196 lb 9.6 oz (89.2 kg)   SpO2 98%   BMI 32.56 kg/m  {Vitals History (Optional):23777}  Physical Exam   No results found for any visits on 03/18/23.    Assessment & Plan:  Type 2 diabetes mellitus with other specified complication, without long-term current use of insulin (HCC)  Essential hypertension  Mixed hyperlipidemia  Influenza vaccine needed    No follow-ups on file.   Deeann Saint, MD

## 2023-03-18 NOTE — Telephone Encounter (Signed)
This provider attempted to contact patient regarding POC A1c results from today's visit as provider informed of results after pt left.  No answer.  Unable to leave voicemail as phone continue to ring.  Will continue trying to reach patient in regards to medication adjustments.

## 2023-03-18 NOTE — Patient Instructions (Signed)
There was a referral placed to [facility] in April 2024.  It says that the referral was approved so I am not sure what happened but you can call their office to set up an appointment.

## 2023-03-20 ENCOUNTER — Encounter: Payer: Self-pay | Admitting: Family Medicine

## 2023-04-04 ENCOUNTER — Telehealth: Payer: Self-pay

## 2023-04-04 NOTE — Telephone Encounter (Signed)
Called patient to get her scheduled per Dr. Salomon Fick

## 2023-04-15 ENCOUNTER — Ambulatory Visit: Payer: 59 | Admitting: Family Medicine

## 2023-04-15 ENCOUNTER — Encounter: Payer: Self-pay | Admitting: Family Medicine

## 2023-04-15 VITALS — BP 116/74 | HR 75 | Temp 98.5°F | Ht 65.16 in | Wt 200.8 lb

## 2023-04-15 DIAGNOSIS — Z7984 Long term (current) use of oral hypoglycemic drugs: Secondary | ICD-10-CM

## 2023-04-15 DIAGNOSIS — Z7985 Long-term (current) use of injectable non-insulin antidiabetic drugs: Secondary | ICD-10-CM | POA: Diagnosis not present

## 2023-04-15 DIAGNOSIS — E1169 Type 2 diabetes mellitus with other specified complication: Secondary | ICD-10-CM

## 2023-04-15 MED ORDER — SEMAGLUTIDE(0.25 OR 0.5MG/DOS) 2 MG/3ML ~~LOC~~ SOPN: 0.5000 mg | PEN_INJECTOR | SUBCUTANEOUS | 1 refills | Status: DC

## 2023-04-15 NOTE — Progress Notes (Signed)
Established Patient Office Visit   Subjective  Patient ID: Linda Conrad, female    DOB: Mar 10, 1976  Age: 47 y.o. MRN: [id number]  Chief Complaint  Patient presents with   Diabetes    DM Follow-up, not taking BG every morning, patient has given up drinking Coke    Patient is a 47 year old female seen for follow-up.  Hemoglobin A1c at last OFV was 12.8%.  Patient mentions her blood sugar was in the 400s.  Has since cut out sodas.  In the past had to give herself injections but does not recall which medication it was.  Patient was hoping to get off of medications.      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   Anemia 08/07/2017   Hx of abnormal cervical Pap smear 08/01/2015   Type 2 diabetes mellitus with other specified complication (HCC) 09/13/2014   Hypertension associated with diabetes (HCC) 11/16/2013   GERD (gastroesophageal reflux disease) 11/16/2013   Hearing loss - followed by [facility] 11/16/2013   Past Medical History:  Diagnosis Date   Abnormal Pap smear of cervix - hpv per report in 2014, seeing gyn 11/16/2013   normal pap with gyn 2015   Anemia    Arthritis    hand   CIN I (cervical intraepithelial neoplasia I) 2020   Diabetes mellitus without complication (HCC)    Family history of bladder cancer    Family history of breast cancer    Family history of kidney cancer    Family history of ovarian cancer    Family history of prostate cancer    Foreign body of hand, right 07/2011   right MP   GERD (gastroesophageal reflux disease)    Headache(784.0)    sinus   HSV-1 (herpes simplex virus 1) infection    Hyperlipidemia    Hypertension    denies HTN, but takes Hyzaar    Kidney stones    Papanicolaou  smear of cervix with positive high risk human papilloma virus (HPV) test 2021   Needs cervical cancer screenig in 2022   Stenosing tenosynovitis of wrist 07/2011   right   Past Surgical History:  Procedure Laterality Date   CYST EXCISION     right thumb   DORSAL COMPARTMENT RELEASE  08/26/2011   Procedure: RELEASE DORSAL COMPARTMENT (DEQUERVAIN);  Surgeon: Wyn Forster., MD;  Location: [facility];  Service: Orthopedics;  Laterality: Right;   FOREIGN BODY REMOVAL  08/26/2011   Procedure: FOREIGN BODY REMOVAL ADULT;  Surgeon: Wyn Forster., MD;  Location: [facility];  Service: Orthopedics;  Laterality: Right;   KNEE ARTHROSCOPY Right 06/10/2021   Social History   Tobacco Use   Smoking status: Never   Smokeless tobacco: Never  Vaping Use   Vaping status: Never Used  Substance Use Topics   Alcohol use: Not Currently   Drug use: No   Family History  Problem Relation Age of Onset   Hyperlipidemia Mother    Hypertension Mother    Hypertension Father    Stroke Father    Hypertension Sister    Diabetes Sister    Arthritis Maternal Grandmother    Kidney failure  Maternal Grandmother    Heart failure Maternal Grandmother    Heart failure Maternal Grandfather    Diabetes Maternal Grandfather    Breast cancer Paternal Grandmother        Age 71s or 61s   Diabetes Mellitus II Paternal Grandmother    Dementia Paternal Grandmother    Throat cancer Paternal Grandmother    Stroke Paternal Grandfather    Cancer Paternal Aunt        bladder or kidney cancer, Age 69/58    Ovarian cancer Cousin 14       maternal uncle's daughter   Colon cancer Other        maternal great-aunt, in her 57s   Ovarian cancer Other        matrenal great-aunt, in her 83s   Esophageal cancer Neg Hx    Stomach cancer Neg Hx    Rectal cancer Neg Hx    Allergies  Allergen Reactions   Codeine Other (See Comments)    Family hx of allergy   Oak Bark [Quercus Robur]      Oak trees   Soap Rash    Everything except Dove      ROS Negative unless stated above    Objective:     BP 116/74 (BP Location: Right Arm, Patient Position: Sitting, Cuff Size: Large)   Pulse 75   Temp 98.5 F (36.9 C) (Oral)   Ht 5' 5.16" (1.655 m)   Wt 200 lb 12.8 oz (91.1 kg)   LMP  (LMP Unknown)   SpO2 97%   BMI 33.25 kg/m  BP Readings from Last 3 Encounters:  04/15/23 116/74  03/18/23 110/86  11/22/22 128/88   Wt Readings from Last 3 Encounters:  04/15/23 200 lb 12.8 oz (91.1 kg)  03/18/23 196 lb 9.6 oz (89.2 kg)  11/22/22 204 lb (92.5 kg)      Physical Exam Constitutional:      General: She is not in acute distress.    Appearance: Normal appearance.  HENT:     Head: Normocephalic and atraumatic.     Nose: Nose normal.     Mouth/Throat:     Mouth: Mucous membranes are moist.  Cardiovascular:     Rate and Rhythm: Normal rate and regular rhythm.     Heart sounds: Normal heart sounds. No murmur heard.    No gallop.  Pulmonary:     Effort: Pulmonary effort is normal. No respiratory distress.     Breath sounds: Normal breath sounds. No wheezing, rhonchi or rales.  Skin:    General: Skin is warm and dry.  Neurological:     Mental Status: She is alert and oriented to person, place, and time.    No results found for any visits on 04/15/23.    Assessment & Plan:  Type 2 diabetes mellitus with other specified complication, without long-term current use of insulin (HCC) -Hemoglobin A1c 12.8% on 03/18/2023 -Discussed the importance of lifestyle modifications.  Congratulated on stopping sodas. -Advised additional medication such as insulin or GLP-1 agonist needed to help lower blood sugar. -Given semaglutide 0.25 mg weekly sample in clinic.  Once finished can pick up Rx for 0.5 mg weekly dose from pharmacy. -Offered patient CGM sample however she declines at this time. -Education provided. -Continue metformin 500 mg twice daily and Jardiance 25 mg daily -      Semaglutide(0.25 or 0.5MG /DOS); Inject 0.5 mg into the skin once a week.  Dispense: 3 mL; Refill: 1   Return in about  8 weeks (around 06/10/2023).   Deeann Saint, MD

## 2023-04-20 ENCOUNTER — Ambulatory Visit (INDEPENDENT_AMBULATORY_CARE_PROVIDER_SITE_OTHER): Payer: 59

## 2023-04-20 DIAGNOSIS — E1169 Type 2 diabetes mellitus with other specified complication: Secondary | ICD-10-CM

## 2023-04-20 DIAGNOSIS — Z7984 Long term (current) use of oral hypoglycemic drugs: Secondary | ICD-10-CM | POA: Diagnosis not present

## 2023-04-20 MED ORDER — DEXCOM G7 SENSOR MISC: 11 refills | Status: DC

## 2023-04-20 NOTE — Progress Notes (Cosign Needed Addendum)
   04/20/2023  Patient ID: Linda Conrad, female   DOB: 08-04-1975, 47 y.o.   MRN: [id number]  Patient presented in office to obtain samples of Dexcom G7 and review use and how to apply.  Dexcom G7 receiver provided and 1 sensor applied in office. Rx for sensors sent into pharmacy.  Patient is hesitant to continue ozempic use (is afraid of any weight loss or medication that may affect her appetite).  Patient has not been taking metformin twice daily as prescribed, thought it was only once daily 500mg . Instructed patient to INCREASE Metformin to 500mg  BID as currently prescribed.  Follow up in 2 weeks  Sherrill Raring, PharmD Clinical Pharmacist [phone]

## 2023-05-02 ENCOUNTER — Ambulatory Visit: Payer: 59

## 2023-05-05 ENCOUNTER — Other Ambulatory Visit: Payer: Self-pay | Admitting: Family Medicine

## 2023-05-05 DIAGNOSIS — K219 Gastro-esophageal reflux disease without esophagitis: Secondary | ICD-10-CM

## 2023-05-06 ENCOUNTER — Ambulatory Visit: Payer: 59

## 2023-05-06 DIAGNOSIS — E1169 Type 2 diabetes mellitus with other specified complication: Secondary | ICD-10-CM

## 2023-05-06 NOTE — Progress Notes (Signed)
   05/06/2023  Patient ID: Linda Conrad, female   DOB: [birth year], 47 y.o.   MRN: [id number]  Patient presented in office for application of a new Dexcom G7 Sensor sample while her current prescription is pending prior authorization (submitted via CoverMy Meds: Key [id number]).  Pulled her report from her first sensor and sugars have improved greatly since being adherent with metformin and cutting out regular Coke sodas.    Also notes that she accidentally missed a dose of her losartan/hydrochlorothiazide yesterday which results in leg swelling. Has started to improve but still swollen.  Counseled on water intake, sodium restriction and med adherence.  Follow Up: 3 days to check on swelling  Sherrill Raring, PharmD Clinical Pharmacist [phone]

## 2023-05-09 ENCOUNTER — Other Ambulatory Visit: Payer: 59

## 2023-05-09 NOTE — Progress Notes (Unsigned)
   05/09/2023  Patient ID: Linda Conrad, female   DOB: 1976/01/18, 47 y.o.   MRN: [id number]  Attempted to contact patient for scheduled appointment for medication management. Unable to leave voice message for patient to return my call at their convenience due to voicemail being full.  Sherrill Raring, PharmD Clinical Pharmacist [phone]

## 2023-05-17 ENCOUNTER — Telehealth: Payer: Self-pay | Admitting: Pharmacy Technician

## 2023-05-17 NOTE — Telephone Encounter (Signed)
Pharmacy Patient Advocate Encounter  Received notification from [facility] that Prior Authorization for Dexcom G7 Sensor has been DENIED.  See denial reason below. No denial letter attached in CMM. Will attach denial letter to Media tab once received.   PA #/Case ID/Reference #: [id number]

## 2023-05-18 ENCOUNTER — Telehealth: Payer: Self-pay

## 2023-05-18 NOTE — Progress Notes (Addendum)
   05/18/2023  Patient ID: Linda Conrad, female   DOB: 09-18-1975, 47 y.o.   MRN: [id number]  Patient returned call. Notified of dexcom denial from insurance. Scheduled f/u on 05/23/23.  Attempted to return a missed call from patient. Received recorded message that patient is not accepting voicemails at this time. Mychart message previously sent is awaiting response.  Attempting to notify patient that her dexcom sensors have been denied and reschedule missed follow up.  Sherrill Raring, PharmD Clinical Pharmacist [phone]

## 2023-05-23 ENCOUNTER — Ambulatory Visit: Payer: 59

## 2023-05-23 DIAGNOSIS — E1169 Type 2 diabetes mellitus with other specified complication: Secondary | ICD-10-CM | POA: Diagnosis not present

## 2023-05-24 ENCOUNTER — Ambulatory Visit: Payer: 59 | Admitting: Family Medicine

## 2023-05-24 ENCOUNTER — Encounter: Payer: Self-pay | Admitting: Family Medicine

## 2023-05-24 VITALS — BP 136/94 | HR 88 | Temp 98.0°F | Resp 18 | Ht 65.16 in | Wt 203.2 lb

## 2023-05-24 DIAGNOSIS — J0110 Acute frontal sinusitis, unspecified: Secondary | ICD-10-CM | POA: Diagnosis not present

## 2023-05-24 MED ORDER — AMOXICILLIN-POT CLAVULANATE 875-125 MG PO TABS: 1.0000 | ORAL_TABLET | Freq: Two times a day (BID) | ORAL | 0 refills | Status: DC

## 2023-05-24 NOTE — Progress Notes (Signed)
Subjective:     Patient ID: Linda Conrad, female    DOB: 1975/06/11, 47 y.o.   MRN: [id number]  Chief Complaint  Patient presents with   Headache    Headache over the eyes and sinus area    Sinusitis    Burning in the nose Sx started 2 weeks ago Has been taking Mucinex sinus    HPI Burning in nose for 2 wks-both sides.  Congestion, runny nose.  Yellow today-was clear and thick.  HA over eyes and sinus area-pressure.  Taking mucinex sinus.   Bp usu ok.  No f/c.  Some dry cough. No sore throat.  Health Maintenance Due  Topic Date Due   Diabetic kidney evaluation - Urine ACR  06/18/2023    Past Medical History:  Diagnosis Date   Abnormal Pap smear of cervix - hpv per report in 2014, seeing gyn 11/16/2013   normal pap with gyn 2015   Anemia    Arthritis    hand   CIN I (cervical intraepithelial neoplasia I) 2020   Diabetes mellitus without complication (HCC)    Family history of bladder cancer    Family history of breast cancer    Family history of kidney cancer    Family history of ovarian cancer    Family history of prostate cancer    Foreign body of hand, right 07/2011   right MP   GERD (gastroesophageal reflux disease)    Headache(784.0)    sinus   HSV-1 (herpes simplex virus 1) infection    Hyperlipidemia    Hypertension    denies HTN, but takes Hyzaar    Kidney stones    Papanicolaou smear of cervix with positive high risk human papilloma virus (HPV) test 2021   Needs cervical cancer screenig in 2022   Stenosing tenosynovitis of wrist 07/2011   right    Past Surgical History:  Procedure Laterality Date   CYST EXCISION     right thumb   DORSAL COMPARTMENT RELEASE  08/26/2011   Procedure: RELEASE DORSAL COMPARTMENT (DEQUERVAIN);  Surgeon: Wyn Forster., MD;  Location: [facility];  Service: Orthopedics;  Laterality: Right;   FOREIGN BODY REMOVAL  08/26/2011   Procedure: FOREIGN BODY REMOVAL ADULT;  Surgeon: Wyn Forster., MD;   Location: [facility];  Service: Orthopedics;  Laterality: Right;   KNEE ARTHROSCOPY Right 06/10/2021     Current Outpatient Medications:    acetaminophen (TYLENOL) 650 MG CR tablet, Take 650 mg by mouth every 8 (eight) hours as needed for pain., Disp: , Rfl:    Ascorbic Acid (VITAMIN C PO), Take 750 mg by mouth daily., Disp: , Rfl:    blood glucose meter kit and supplies KIT, Dispense based on patient and insurance preference. Use up to three times daily as directed. (FOR ICD-9 250.00, 250.01)., Disp: 1 each, Rfl: 0   CINNAMON PO, Take by mouth., Disp: , Rfl:    Continuous Glucose Sensor (DEXCOM G7 SENSOR) MISC, Apply 1 sensor every 10 days as directed to monitor blood glucose, Disp: 3 each, Rfl: 11   [employer] SUNSCREEN SPF 30 EX, apply topically to face and body daily for 30, Disp: , Rfl:    cyanocobalamin (VITAMIN B12) 1000 MCG tablet, Take by mouth., Disp: , Rfl:    ELDERBERRY PO, Take by mouth., Disp: , Rfl:    empagliflozin (JARDIANCE) 10 MG TABS tablet, Take by mouth., Disp: , Rfl:    ferrous sulfate 325 (65 FE)  MG EC tablet, Take 325 mg by mouth daily., Disp: , Rfl:    fluticasone (FLONASE) 50 MCG/ACT nasal spray, Place into both nostrils daily., Disp: , Rfl:    FOLIC ACID PO, Folic Acid, Disp: , Rfl:    hydrochlorothiazide (HYDRODIURIL) 25 MG tablet, Take by mouth., Disp: , Rfl:    IBUPROFEN PO, Take by mouth as needed. Knee pain, Disp: , Rfl:    Lancets (ONETOUCH DELICA PLUS LANCET33G) MISC, USE AS DIRECTED TO TEST BLOOD SUGAR 1-3 TIMES PER DAY, Disp: 100 each, Rfl: 2   losartan-hydrochlorothiazide (HYZAAR) 50-12.5 MG tablet, Take 1 tablet by mouth daily., Disp: 90 tablet, Rfl: 3   metFORMIN (GLUCOPHAGE) 500 MG tablet, TAKE 1 TABLET BY MOUTH TWICE A DAY WITH A MEAL, Disp: 180 tablet, Rfl: 3   mometasone (ELOCON) 0.1 % cream, Apply 1 application topically daily. (Patient taking differently: Apply 1 application  topically daily. As needed), Disp: 45 g, Rfl: 1   Multiple  Vitamins-Minerals (ZINC PO), Take by mouth., Disp: , Rfl:    norethindrone (MICRONOR) 0.35 MG tablet, TAKE 1 TABLET(0.35 MG) BY MOUTH DAILY (Patient taking differently: Take 1 tablet by mouth daily. Linda Conrad. TAKE 1 TABLET(0.35 MG) BY MOUTH DAILY), Disp: 84 tablet, Rfl: 3   Omega-3 Fatty Acids (FISH OIL) 1000 MG CAPS, Take by mouth., Disp: , Rfl:    omeprazole (PRILOSEC) 20 MG capsule, TAKE 1 CAPSULE BY MOUTH DAILY, Disp: 90 capsule, Rfl: 1   pimecrolimus (ELIDEL) 1 % cream, Apply topically., Disp: , Rfl:    potassium chloride (KLOR-CON) 10 MEQ tablet, TAKE 1 TABLET BY MOUTH DAILY, Disp: 90 tablet, Rfl: 3   pravastatin (PRAVACHOL) 20 MG tablet, Take 1 tablet (20 mg total) by mouth daily., Disp: 90 tablet, Rfl: 3   SALINE NASAL SPRAY NA, Place into the nose as needed., Disp: , Rfl:    Semaglutide,0.25 or 0.5MG /DOS, 2 MG/3ML SOPN, Inject 0.5 mg into the skin once a week., Disp: 3 mL, Rfl: 1   tamsulosin (FLOMAX) 0.4 MG CAPS capsule, TAKE 1 CAPSULE BY MOUTH DAILY, Disp: 30 capsule, Rfl: 1   triamcinolone cream (KENALOG) 0.1 %, Apply topically daily., Disp: 30 g, Rfl: 1   TURMERIC CURCUMIN PO, Take by mouth., Disp: , Rfl:    valACYclovir (VALTREX) 500 MG tablet, Take 1 tablet (500 mg total) by mouth daily., Disp: 90 tablet, Rfl: 1   zinc gluconate 50 MG tablet, Take 50 mg by mouth daily., Disp: , Rfl:    amoxicillin-clavulanate (AUGMENTIN) 875-125 MG tablet, Take 1 tablet by mouth 2 (two) times daily., Disp: 20 tablet, Rfl: 0  Allergies  Allergen Reactions   Codeine Other (See Comments)    Family hx of allergy   Oak Bark [Quercus Robur]     Oak trees   Soap Rash    Everything except Dove   ROS neg/noncontributory except as noted HPI/below      Objective:     BP (!) 136/94 (BP Location: Left Arm, Patient Position: Sitting, Cuff Size: Large)   Pulse 88   Temp 98 F (36.7 C) (Temporal)   Resp 18   Ht 5' 5.16" (1.655 m)   Wt 203 lb 4 oz (92.2 kg)   SpO2 98%   BMI 33.66 kg/m  Wt  Readings from Last 3 Encounters:  05/24/23 203 lb 4 oz (92.2 kg)  04/15/23 200 lb 12.8 oz (91.1 kg)  03/18/23 196 lb 9.6 oz (89.2 kg)    Physical Exam   Gen: WDWN NAD HEENT: NCAT,  conjunctiva not injected, sclera nonicteric TM WNL B, OP moist, no exudates  congested NECK:  supple, no thyromegaly, no nodes, no carotid bruits CARDIAC: RRR, S1S2+, no murmur.  LUNGS: CTAB. No wheezes MSK: no gross abnormalities.  NEURO: A&O x3.  CN II-XII intact.  PSYCH: normal mood. Good eye contact     Assessment & Plan:  Acute non-recurrent frontal sinusitis  Other orders -     Amoxicillin-Pot Clavulanate; Take 1 tablet by mouth 2 (two) times daily.  Dispense: 20 tablet; Refill: 0  Sinusitis.  Stop mucinex sinus as elevating bp.  Nasal saline. Tylenol.  Augmentin 875 bid  Return if symptoms worsen or fail to improve.  Angelena Sole, MD

## 2023-05-24 NOTE — Patient Instructions (Addendum)
Happy Holidays!  Take the antibiotics, nasal saline, tylenol

## 2023-05-26 NOTE — Progress Notes (Addendum)
05/26/2023 Name: Linda Conrad MRN: [id number] DOB: 06/30/1975  Chief Complaint  Patient presents with   Diabetes   Medication Management    Linda Conrad is a 47 y.o. year old female who was referred for medication management by their primary care provider, Linda Saint, MD. They presented for a face to face visit today.   They were referred to the pharmacist by their PCP for assistance in managing diabetes    Subjective:  Care Team: Primary Care Provider: Deeann Saint, MD ; Next Scheduled Visit: 06/20/23  Medication Access/Adherence  Current Pharmacy:  [id number] [place], [place] - [address] [place] [address] Phone: [phone] Fax: [phone]  [id number] [place], [place] - [address] [place] [address] Phone: [phone] Fax: [phone]  [id number] - [place], [place] - [address] AT [facility] OF [address] [place] [address] Phone: [phone] Fax: [phone]   Patient reports affordability concerns with their medications: No  Patient reports access/transportation concerns to their pharmacy: No  Patient reports adherence concerns with their medications:  No     Diabetes:  Current medications: Metformin 500mg  BID, Jardiance 10mg  Medications tried in the past: Ozempic (samples)  Current glucose readings: Dexcom sensors denied by insurance, not pricking her fingers   Patient denies hypoglycemic s/sx including dizziness, shakiness, sweating. Patient denies hyperglycemic symptoms including polyuria, polydipsia, polyphagia, nocturia, neuropathy, blurred vision.  Current meal patterns:  Has cut out regular sodas, is being more mindful of carbs in diet  Started Ozempic samples but stopped after 1 dose, hesitant about causing any weight loss   Objective:  Lab Results  Component  Value Date   HGBA1C 12.8 (A) 03/18/2023    Lab Results  Component Value Date   CREATININE 0.84 07/29/2022   BUN 11 07/29/2022   NA 137 07/29/2022   K 3.6 07/29/2022   CL 102 07/29/2022   CO2 26 07/29/2022    Lab Results  Component Value Date   CHOL 134 07/29/2022   HDL 57.60 07/29/2022   LDLCALC 66 07/29/2022   TRIG 53.0 07/29/2022   CHOLHDL 2 07/29/2022    Medications Reviewed Today     Reviewed by Linda Conrad, RPH (Pharmacist) on 05/26/23 at 1015  Med List Status: <None>   Medication Order Taking? Sig Documenting Provider Last Dose Status Informant  acetaminophen (TYLENOL) 650 MG CR tablet [id number] No Take 650 mg by mouth every 8 (eight) hours as needed for pain. [provider] Taking Active   amoxicillin-clavulanate (AUGMENTIN) 875-125 MG tablet [id number]  Take 1 tablet by mouth 2 (two) times daily. Linda Sow, MD  Active   Ascorbic Acid (VITAMIN Conrad PO) [id number] No Take 750 mg by mouth daily. [provider] Taking Active   blood glucose meter kit and supplies KIT [id number] No Dispense based on patient and insurance preference. Use up to three times daily as directed. (FOR ICD-9 250.00, 250.01). Linda Banker, MD Taking Active   CINNAMON PO [id number] No Take by mouth. [provider] Taking Active   Discontinued 05/26/23 1014 (Cost of medication)   [employer] SUNSCREEN SPF 30 EX [id number] No apply topically to face and body daily for 30 [provider] Taking Active   cyanocobalamin (VITAMIN B12) 1000 MCG tablet [id number] No Take by mouth. [provider] Taking Active   ELDERBERRY PO [id number]  No Take by mouth. [provider] Taking Active   empagliflozin (JARDIANCE) 10 MG TABS tablet [id number] No Take by mouth. [provider] Taking Active   ferrous sulfate 325 (65 FE) MG EC tablet [id number] No Take 325 mg by mouth daily. [provider] Taking Active   fluticasone (FLONASE) 50 MCG/ACT  nasal spray [id number] No Place into both nostrils daily. [provider] Taking Active   FOLIC ACID PO [id number] No Folic Acid [provider] Taking Active   hydrochlorothiazide (HYDRODIURIL) 25 MG tablet [id number] No Take by mouth. [provider] Taking Active   IBUPROFEN PO [id number] No Take by mouth as needed. Knee pain [provider] Taking Active   Lancets [facility] DELICA PLUS [place]) MISC [id number] No USE AS DIRECTED TO TEST BLOOD SUGAR 1-3 TIMES PER DAY Linda Conrad, Linda C, MD Taking Active   losartan-hydrochlorothiazide (HYZAAR) 50-12.5 MG tablet [id number] No Take 1 tablet by mouth daily. Linda Saint, MD Taking Active   metFORMIN (GLUCOPHAGE) 500 MG tablet [id number] No TAKE 1 TABLET BY MOUTH TWICE A DAY WITH A MEAL Linda Conrad, Linda Mare, MD Taking Active   mometasone (ELOCON) 0.1 % cream [id number] No Apply 1 application topically daily.  Patient taking differently: Apply 1 application  topically daily. As needed   Linda Koyanagi, DO Taking Active   Multiple Vitamins-Minerals (ZINC PO) [id number] No Take by mouth. [provider] Taking Active   norethindrone (MICRONOR) 0.35 MG tablet [id number] No TAKE 1 TABLET(0.35 MG) BY MOUTH DAILY  Patient taking differently: Take 1 tablet by mouth daily. Linda Conrad. TAKE 1 TABLET(0.35 MG) BY MOUTH DAILY   Linda Salles, MD Taking Active   Omega-3 Fatty Acids (FISH OIL) 1000 MG CAPS [id number] No Take by mouth. [provider] Taking Active   omeprazole (PRILOSEC) 20 MG capsule [id number] No TAKE 1 CAPSULE BY MOUTH DAILY Linda Saint, MD Taking Active   pimecrolimus (ELIDEL) 1 % cream [id number] No Apply topically. [provider] Taking Active   potassium chloride (KLOR-CON) 10 MEQ tablet [id number] No TAKE 1 TABLET BY MOUTH DAILY Linda Saint, MD Taking Active   pravastatin (PRAVACHOL) 20 MG tablet [id number] No Take 1 tablet (20 mg total) by mouth daily. Linda Saint,  MD Taking Active   SALINE NASAL SPRAY NA [id number] No Place into the nose as needed. [provider] Taking Active   Semaglutide,0.25 or 0.5MG /DOS, 2 MG/3ML SOPN [id number] No Inject 0.5 mg into the skin once a week. Linda Saint, MD Taking Active   tamsulosin [facility]) 0.4 MG CAPS capsule [id number] No TAKE 1 CAPSULE BY MOUTH DAILY Linda Saint, MD Taking Active   triamcinolone cream (KENALOG) 0.1 % [id number] No Apply topically daily. Linda Koyanagi, DO Taking Active   TURMERIC CURCUMIN PO [id number] No Take by mouth. [provider] Taking Active   valACYclovir (VALTREX) 500 MG tablet [id number] No Take 1 tablet (500 mg total) by mouth daily. Linda Saint, MD Taking Active   zinc gluconate 50 MG tablet [id number] No Take 50 mg by mouth daily. [provider] Taking Active               Assessment/Plan:   Diabetes: - Currently uncontrolled - Reviewed long term cardiovascular and renal outcomes of uncontrolled blood sugar - Reviewed goal A1c, goal fasting, and goal 2 hour post prandial glucose - Reviewed dietary modifications including low carb diet - Recommend to RESTART Ozempic at 0.5mg  once weekly as previously  prescribed  - Patient denies personal or family history of multiple endocrine neoplasia type 2, medullary thyroid cancer; personal history of pancreatitis or gallbladder disease. - Recommend to check glucose once daily at varying times (fasting vs post-prandial)     Follow Up Plan: PCP on 06/20/23, sch f/u with pharmacist after  Linda Conrad, PharmD Clinical Pharmacist [phone]

## 2023-05-27 ENCOUNTER — Ambulatory Visit: Payer: 59 | Admitting: Adult Health

## 2023-06-10 ENCOUNTER — Ambulatory Visit: Payer: 59 | Admitting: Family Medicine

## 2023-06-20 ENCOUNTER — Encounter: Payer: Self-pay | Admitting: Family Medicine

## 2023-06-20 ENCOUNTER — Ambulatory Visit: Payer: 59 | Admitting: Family Medicine

## 2023-06-20 ENCOUNTER — Other Ambulatory Visit: Payer: Self-pay | Admitting: Family Medicine

## 2023-06-20 VITALS — BP 134/82 | HR 102 | Temp 99.1°F | Ht 65.16 in | Wt 204.8 lb

## 2023-06-20 DIAGNOSIS — E1169 Type 2 diabetes mellitus with other specified complication: Secondary | ICD-10-CM

## 2023-06-20 DIAGNOSIS — E782 Mixed hyperlipidemia: Secondary | ICD-10-CM | POA: Diagnosis not present

## 2023-06-20 DIAGNOSIS — I10 Essential (primary) hypertension: Secondary | ICD-10-CM | POA: Diagnosis not present

## 2023-06-20 DIAGNOSIS — Z23 Encounter for immunization: Secondary | ICD-10-CM | POA: Diagnosis not present

## 2023-06-20 DIAGNOSIS — B009 Herpesviral infection, unspecified: Secondary | ICD-10-CM

## 2023-06-20 DIAGNOSIS — Z7985 Long-term (current) use of injectable non-insulin antidiabetic drugs: Secondary | ICD-10-CM

## 2023-06-20 DIAGNOSIS — Z7984 Long term (current) use of oral hypoglycemic drugs: Secondary | ICD-10-CM

## 2023-06-20 LAB — BASIC METABOLIC PANEL
BUN: 13 mg/dL (ref 6–23)
CO2: 25 meq/L (ref 19–32)
Calcium: 8.9 mg/dL (ref 8.4–10.5)
Chloride: 103 meq/L (ref 96–112)
Creatinine, Ser: 0.67 mg/dL (ref 0.40–1.20)
GFR: 103.03 mL/min (ref >60.00)
Glucose, Bld: 89 mg/dL (ref 70–99)
Potassium: 3.4 meq/L — ABNORMAL LOW (ref 3.5–5.1)
Sodium: 135 meq/L (ref 135–145)

## 2023-06-20 LAB — MICROALBUMIN / CREATININE URINE RATIO
Creatinine,U: 123.6 mg/dL
Microalb Creat Ratio: 0.6 mg/g (ref 0.0–30.0)
Microalb, Ur: <0.7 mg/dL (ref 0.0–1.9)

## 2023-06-20 LAB — GLUCOSE, POCT (MANUAL RESULT ENTRY): POC Glucose: 101 mg/dL — AB (ref 70–99)

## 2023-06-20 LAB — POCT GLYCOSYLATED HEMOGLOBIN (HGB A1C): Hemoglobin A1C: 6.8 % — AB (ref 4.0–5.6)

## 2023-06-20 LAB — HEMOGLOBIN A1C: Hgb A1c MFr Bld: 7.1 % — ABNORMAL HIGH (ref 4.6–6.5)

## 2023-06-20 MED ORDER — SEMAGLUTIDE(0.25 OR 0.5MG/DOS) 2 MG/3ML ~~LOC~~ SOPN
0.5000 mg | PEN_INJECTOR | SUBCUTANEOUS | 2 refills | Status: DC
Start: 2023-06-20 — End: 2023-09-26

## 2023-06-20 MED ORDER — PRAVASTATIN SODIUM 20 MG PO TABS
20.0000 mg | ORAL_TABLET | Freq: Every day | ORAL | 3 refills | Status: DC
Start: 2023-06-20 — End: 2023-12-26

## 2023-06-20 MED ORDER — VALACYCLOVIR HCL 500 MG PO TABS: 500.0000 mg | ORAL_TABLET | Freq: Every day | ORAL | 1 refills | Status: DC

## 2023-06-20 NOTE — Patient Instructions (Signed)
Your hemoglobin A1c is now 6.8%.  Continue the current doses of your medications and the changes to your diet.

## 2023-06-20 NOTE — Progress Notes (Signed)
Established Patient Office Visit   Subjective  Patient ID: Linda Conrad, female    DOB: 1975/08/25  Age: 48 y.o. MRN: [id number]  Chief Complaint  Patient presents with   Medical Management of Chronic Issues    Patient is a 48 year old female seen for follow-up on chronic conditions.  Hemoglobin A1c was 12.8% on 03/18/2023.  Pt not checking blood sugar at home.  Given samples of semaglutide at previous office visit but was hesitant to take as concerned about weight loss.  Currently taking semaglutide 0.5 mg weekly on Mondays, metformin 500 mg twice daily, and Jardiance.  Had a visit with clinic pharmacist.  Dexcom sensors were denied by insurance.  Patient was able to try a sample of a CGM during last OFV.  Patient was initially hesitant but states she liked using it due to the alerts.  Patient working on diet.  States cut out sodas.  If has a [employer] has only a small amount.  Patient did not take BP meds this morning.  Typically takes losartan-hydrochlorothiazide 50-12.5 mg daily.  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  Patient mentions being sick at the end of December due to sinuses.  Was seen at another clinic and given Augmentin.  Patient states she then developed GI symptoms.  Patient was concerned the GI symptoms were due to semaglutide, but has not noticed any since.    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   Anemia 08/07/2017   Hx of abnormal cervical Pap smear 08/01/2015   Type 2 diabetes mellitus with other specified complication (HCC) 09/13/2014   Hypertension associated with diabetes (HCC) 11/16/2013   GERD (gastroesophageal reflux disease) 11/16/2013   Hearing loss - followed by [facility]  11/16/2013   Past Medical History:  Diagnosis Date   Abnormal Pap smear of cervix - hpv per report in 2014, seeing gyn 11/16/2013   normal pap with gyn 2015   Anemia    Arthritis    hand   CIN I (cervical intraepithelial neoplasia I) 2020   Diabetes mellitus without complication (HCC)    Family history of bladder cancer    Family history of breast cancer    Family history of kidney cancer    Family history of ovarian cancer    Family history of prostate cancer    Foreign body of hand, right 07/2011   right MP   GERD (gastroesophageal reflux disease)    Headache(784.0)    sinus   HSV-1 (herpes simplex virus 1) infection    Hyperlipidemia    Hypertension    denies HTN, but takes Hyzaar    Kidney stones    Papanicolaou smear of cervix with positive high risk human papilloma virus (HPV) test 2021   Needs cervical cancer screenig in 2022   Stenosing tenosynovitis of wrist 07/2011   right   Past Surgical History:  Procedure Laterality Date   CYST EXCISION     right thumb   DORSAL COMPARTMENT RELEASE  08/26/2011   Procedure: RELEASE DORSAL COMPARTMENT (DEQUERVAIN);  Surgeon: Wyn Forster., MD;  Location: [facility];  Service: Orthopedics;  Laterality: Right;   FOREIGN BODY REMOVAL  08/26/2011   Procedure: FOREIGN BODY REMOVAL ADULT;  Surgeon: Wyn Forster., MD;  Location: [facility];  Service: Orthopedics;  Laterality: Right;   KNEE ARTHROSCOPY Right 06/10/2021   Social History   Tobacco Use   Smoking status: Never   Smokeless tobacco: Never  Vaping Use   Vaping status: Never Used  Substance Use Topics   Alcohol use: Not Currently   Drug use: No   Family History  Problem Relation Age of Onset   Hyperlipidemia Mother    Hypertension Mother    Hypertension Father    Stroke Father    Hypertension Sister    Diabetes Sister    Arthritis Maternal Grandmother    Kidney failure Maternal Grandmother    Heart failure Maternal  Grandmother    Heart failure Maternal Grandfather    Diabetes Maternal Grandfather    Breast cancer Paternal Grandmother        Age 23s or 62s   Diabetes Mellitus II Paternal Grandmother    Dementia Paternal Grandmother    Throat cancer Paternal Grandmother    Stroke Paternal Grandfather    Cancer Paternal Aunt        bladder or kidney cancer, Age 53/58    Ovarian cancer Cousin 24       maternal uncle's daughter   Colon cancer Other        maternal great-aunt, in her 23s   Ovarian cancer Other        matrenal great-aunt, in her 26s   Esophageal cancer Neg Hx    Stomach cancer Neg Hx    Rectal cancer Neg Hx    Allergies  Allergen Reactions   Codeine Other (See Comments)    Family hx of allergy   Oak Bark [Quercus Robur]     Oak trees   Soap Rash    Everything except Dove      ROS Negative unless stated above    Objective:     BP 134/82 (BP Location: Left Arm, Patient Position: Sitting, Cuff Size: Large)   Pulse (!) 102   Temp 99.1 F (37.3 C) (Oral)   Ht 5' 5.16" (1.655 m)   Wt 204 lb 12.8 oz (92.9 kg)   LMP  (LMP Unknown)   SpO2 95%   BMI 33.91 kg/m  BP Readings from Last 3 Encounters:  06/20/23 134/82  05/24/23 (!) 136/94  04/15/23 116/74   Wt Readings from Last 3 Encounters:  06/20/23 204 lb 12.8 oz (92.9 kg)  05/24/23 203 lb 4 oz (92.2 kg)  04/15/23 200 lb 12.8 oz (91.1 kg)      Physical Exam Constitutional:      General: She is not in acute distress.    Appearance: Normal appearance.  HENT:     Head: Normocephalic and atraumatic.     Nose: Nose normal.     Mouth/Throat:     Mouth: Mucous membranes are moist.  Cardiovascular:     Rate and Rhythm: Normal rate and regular rhythm.     Heart sounds: Normal heart sounds. No murmur heard.    No gallop.  Pulmonary:     Effort: Pulmonary effort is normal. No respiratory distress.     Breath sounds: Normal breath sounds. No wheezing, rhonchi or rales.  Skin:    General: Skin is warm and dry.   Neurological:     Mental Status: She is alert and oriented to person, place, and time.    Diabetic Foot Exam - Simple  Simple Foot Form Diabetic Foot exam was performed with the following findings: Yes 06/20/2023 11:31 AM  Visual Inspection No deformities, no ulcerations, no other skin breakdown bilaterally: Yes Sensation Testing Intact to touch and monofilament testing bilaterally: Yes Pulse Check Posterior Tibialis and Dorsalis pulse intact bilaterally: Yes Comments      Results for orders placed or performed in visit on 06/20/23  POC Glucose (CBG)  Result Value Ref Range   POC Glucose 101 (A) 70 - 99 mg/dl  POC HgB W2N  Result Value Ref Range   Hemoglobin A1C 6.8 (A) 4.0 - 5.6 %   HbA1c POC (<> result, manual entry)     HbA1c, POC (prediabetic range)     HbA1c, POC (controlled diabetic range)        Assessment & Plan:  Type 2 diabetes mellitus with other specified complication, without long-term current use of insulin (HCC) -Hemoglobin A1c 12.8% on 03/18/2023 -POC glucose 101 -POC A1c 6.8% this visit. -Continue Ozempic 0.5 mg weekly (taking each Monday), Jardiance 25 mg daily, and metformin 500 mg twice daily. -Patient advised to start checking blood sugar each day and keeping a log to bring to clinic -Patient advised to continue lifestyle modifications -Foot exam done this visit -Patient to schedule eye exam -Continue ARB pravastatin 20 mg daily -     Microalbumin / creatinine urine ratio -     Basic metabolic panel -     Hemoglobin A1c -     POCT glucose (manual entry) -     POCT glycosylated hemoglobin (Hb A1C)  Essential hypertension -Elevated -Recheck -Discussed importance of lifestyle modifications -Continue current medication including losartan-hydrochlorothiazide 50-12.5 mg daily -Patient encouraged to go home and take medication today as she has not yet to do so.  Patient encouraged to check BP at home and monitor for elevations consistently greater  than 140/90.  For continued elevations increase losartan.        -     Basic metabolic panel  Need for pneumococcal 20-valent conjugate vaccination -     Pneumococcal conjugate vaccine 20-valent  HSV-1 infection -     valACYclovir HCl; Take 1 tablet (500 mg total) by mouth daily.  Dispense: 90 tablet; Refill: 1  Mixed hyperlipidemia -Continue pravastatin 20 mg daily -Continue lifestyle modifications       -     Pravastatin; take 1 tablet20 mg) by mouth daily.  Dispense: 90 tablet; Refill: 3  Return in about 3 months (around 09/18/2023).   Deeann Saint, MD

## 2023-07-06 ENCOUNTER — Encounter: Payer: Self-pay | Admitting: Family Medicine

## 2023-08-05 ENCOUNTER — Other Ambulatory Visit: Payer: Self-pay | Admitting: Family Medicine

## 2023-08-05 DIAGNOSIS — E1169 Type 2 diabetes mellitus with other specified complication: Secondary | ICD-10-CM

## 2023-08-17 ENCOUNTER — Other Ambulatory Visit: Payer: Self-pay | Admitting: Family Medicine

## 2023-08-17 DIAGNOSIS — E1169 Type 2 diabetes mellitus with other specified complication: Secondary | ICD-10-CM

## 2023-08-23 ENCOUNTER — Other Ambulatory Visit: Payer: Self-pay | Admitting: Family Medicine

## 2023-08-23 DIAGNOSIS — I10 Essential (primary) hypertension: Secondary | ICD-10-CM

## 2023-09-01 ENCOUNTER — Other Ambulatory Visit: Payer: Self-pay | Admitting: Family Medicine

## 2023-09-01 DIAGNOSIS — I10 Essential (primary) hypertension: Secondary | ICD-10-CM

## 2023-09-02 ENCOUNTER — Other Ambulatory Visit: Payer: Self-pay | Admitting: Family Medicine

## 2023-09-02 DIAGNOSIS — E782 Mixed hyperlipidemia: Secondary | ICD-10-CM

## 2023-09-02 DIAGNOSIS — I10 Essential (primary) hypertension: Secondary | ICD-10-CM

## 2023-09-02 MED ORDER — LOSARTAN POTASSIUM-HCTZ 50-12.5 MG PO TABS: 1.0000 | ORAL_TABLET | Freq: Every day | ORAL | 3 refills | Status: DC

## 2023-09-02 NOTE — Telephone Encounter (Signed)
 Copied from CRM [id number]. Topic: Clinical - Medication Refill >> Sep 02, 2023 11:58 AM Saverio Danker wrote: Most Recent Primary Care Visit:  Provider: Abbe Amsterdam R  Department: [facility]  Visit Type: OFFICE VISIT  Date: 06/20/2023  Medication: losartan-hydrochlorothiazide (HYZAAR) 50-12.5 MG tablet,pravastatin (PRAVACHOL) 20 MG tablet  Has the patient contacted their pharmacy? Yes (Agent: If no, request that the patient contact the pharmacy for the refill. If patient does not wish to contact the pharmacy document the reason why and proceed with request.) (Agent: If yes, when and what did the pharmacy advise?)  Is this the correct pharmacy for this prescription? Yes If no, delete pharmacy and type the correct one.  This is the patient's preferred pharmacy:  [facility] [id number] - [address], [place] - [address] [place] [address] Phone: [phone] Fax: [phone]  [id number] - [place], [place] - [address] [place] [address] Phone: [phone] Fax: [phone]  [id number] - [place], [place] - [address] AT [facility] [address] [place] [address] Phone: [phone] Fax: [phone]   Has the prescription been filled recently? No  Is the patient out of the medication? Yes  Has the patient been seen for an appointment in the last year OR does the patient have an upcoming appointment? Yes  Can we respond through MyChart? Yes  Agent: Please be advised that Rx refills may take up to 3 business days. We ask that you follow-up with your pharmacy.

## 2023-09-26 ENCOUNTER — Ambulatory Visit: Payer: 59 | Admitting: Family Medicine

## 2023-09-26 ENCOUNTER — Encounter: Payer: Self-pay | Admitting: Family Medicine

## 2023-09-26 VITALS — BP 156/84 | HR 84 | Temp 98.9°F | Ht 65.16 in | Wt 203.2 lb

## 2023-09-26 DIAGNOSIS — J302 Other seasonal allergic rhinitis: Secondary | ICD-10-CM | POA: Diagnosis not present

## 2023-09-26 DIAGNOSIS — I10 Essential (primary) hypertension: Secondary | ICD-10-CM | POA: Diagnosis not present

## 2023-09-26 DIAGNOSIS — E782 Mixed hyperlipidemia: Secondary | ICD-10-CM

## 2023-09-26 DIAGNOSIS — Z7985 Long-term (current) use of injectable non-insulin antidiabetic drugs: Secondary | ICD-10-CM

## 2023-09-26 DIAGNOSIS — E1169 Type 2 diabetes mellitus with other specified complication: Secondary | ICD-10-CM

## 2023-09-26 LAB — POCT GLYCOSYLATED HEMOGLOBIN (HGB A1C): Hemoglobin A1C: 5.5 % (ref 4.0–5.6)

## 2023-09-26 MED ORDER — SEMAGLUTIDE(0.25 OR 0.5MG/DOS) 2 MG/3ML ~~LOC~~ SOPN
0.5000 mg | PEN_INJECTOR | SUBCUTANEOUS | 11 refills | Status: DC
Start: 2023-09-26 — End: 2023-12-26

## 2023-09-26 MED ORDER — BLOOD PRESSURE CUFF MISC
1.0000 | 0 refills | Status: AC
Start: 2023-09-26 — End: ?

## 2023-09-26 MED ORDER — LOSARTAN POTASSIUM-HCTZ 50-12.5 MG PO TABS
1.0000 | ORAL_TABLET | Freq: Every day | ORAL | 3 refills | Status: DC
Start: 2023-09-26 — End: 2023-12-26

## 2023-09-26 NOTE — Progress Notes (Signed)
 Established Patient Office Visit   Subjective  Patient ID: Linda Conrad, female    DOB: 09/14/1975  Age: 48 y.o. MRN: [id number]  Chief Complaint  Patient presents with   Follow-up    3 month follow-up for Blood pressure and DM     Pt is a 48 yo female seen for f/u on chronic condition.  Patient states she has cut out drinking soda.  Not checking blood sugar at home.  Exercising 1-2 times per week at [employer].  On Ozempic  0.5 mg weekly, metformin  500 mg twice daily, and Jardiance  25 mg daily.  States only given 1 mo supply of ozempic  at a time.Patient states blood sugar was 120 nonfasting at work and blood pressure was 151/89.  Patient denies increased sodium intake though eating more cucumbers could balsamic vinaigrette.  Pt had eye exam a few weeks ago.  Diabetic retinopathy screen was negative.  Given allergy eyedrops for dry gritty sensation in eyes.    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   Anemia 08/07/2017   Hx of abnormal cervical Pap smear 08/01/2015   Type 2 diabetes mellitus with other specified complication (HCC) 09/13/2014   Hypertension associated with diabetes (HCC) 11/16/2013   GERD (gastroesophageal reflux disease) 11/16/2013   Hearing loss - followed by [facility] 11/16/2013   Past Medical History:  Diagnosis Date   Abnormal Pap smear of cervix - hpv per report in 2014, seeing gyn 11/16/2013   normal pap with gyn 2015   Anemia    Arthritis    hand   CIN I (cervical intraepithelial neoplasia I) 2020   Diabetes mellitus without complication (HCC)    Family history of bladder cancer    Family history of breast cancer    Family history of kidney cancer    Family history of ovarian cancer    Family  history of prostate cancer    Foreign body of hand, right 07/2011   right MP   GERD (gastroesophageal reflux disease)    Headache(784.0)    sinus   HSV-1 (herpes simplex virus 1) infection    Hyperlipidemia    Hypertension    denies HTN, but takes Hyzaar    Kidney stones    Papanicolaou smear of cervix with positive high risk human papilloma virus (HPV) test 2021   Needs cervical cancer screenig in 2022   Stenosing tenosynovitis of wrist 07/2011   right   Past Surgical History:  Procedure Laterality Date   CYST EXCISION     right thumb   DORSAL COMPARTMENT RELEASE  08/26/2011   Procedure: RELEASE DORSAL COMPARTMENT (DEQUERVAIN);  Surgeon: Amelie Baize., MD;  Location: [facility];  Service: Orthopedics;  Laterality: Right;   FOREIGN BODY REMOVAL  08/26/2011   Procedure: FOREIGN BODY REMOVAL ADULT;  Surgeon: Amelie Baize., MD;  Location: [facility];  Service: Orthopedics;  Laterality: Right;   KNEE ARTHROSCOPY Right 06/10/2021   Social History   Tobacco Use   Smoking status: Never   Smokeless tobacco: Never  Vaping Use   Vaping status: Never Used  Substance Use Topics   Alcohol use: Not Currently   Drug  use: No   Family History  Problem Relation Age of Onset   Hyperlipidemia Mother    Hypertension Mother    Hypertension Father    Stroke Father    Hypertension Sister    Diabetes Sister    Arthritis Maternal Grandmother    Kidney failure Maternal Grandmother    Heart failure Maternal Grandmother    Heart failure Maternal Grandfather    Diabetes Maternal Grandfather    Breast cancer Paternal Grandmother        Age 65s or 23s   Diabetes Mellitus II Paternal Grandmother    Dementia Paternal Grandmother    Throat cancer Paternal Grandmother    Stroke Paternal Grandfather    Cancer Paternal Aunt        bladder or kidney cancer, Age 28/58    Ovarian cancer Cousin 72       maternal uncle's daughter   Colon cancer Other         maternal great-aunt, in her 52s   Ovarian cancer Other        matrenal great-aunt, in her 33s   Esophageal cancer Neg Hx    Stomach cancer Neg Hx    Rectal cancer Neg Hx    Allergies  Allergen Reactions   Codeine Other (See Comments)    Family hx of allergy   Oak Bark [Quercus Robur]     Oak trees   Soap Rash    Everything except Dove      ROS Negative unless stated above    Objective:     BP (!) 156/84 (BP Location: Left Arm, Patient Position: Sitting, Cuff Size: Normal)   Pulse 84   Temp 98.9 F (37.2 C) (Oral)   Ht 5' 5.16" (1.655 m)   Wt 203 lb 3.2 oz (92.2 kg)   SpO2 99%   BMI 33.65 kg/m  BP Readings from Last 3 Encounters:  09/26/23 (!) 156/84  06/20/23 134/82  05/24/23 (!) 136/94   Wt Readings from Last 3 Encounters:  09/26/23 203 lb 3.2 oz (92.2 kg)  06/20/23 204 lb 12.8 oz (92.9 kg)  05/24/23 203 lb 4 oz (92.2 kg)      Physical Exam Constitutional:      General: She is not in acute distress.    Appearance: Normal appearance.  HENT:     Head: Normocephalic and atraumatic.     Nose: Nose normal.     Mouth/Throat:     Mouth: Mucous membranes are moist.  Cardiovascular:     Rate and Rhythm: Normal rate and regular rhythm.     Heart sounds: Normal heart sounds. No murmur heard.    No gallop.  Pulmonary:     Effort: Pulmonary effort is normal. No respiratory distress.     Breath sounds: Normal breath sounds. No wheezing, rhonchi or rales.  Skin:    General: Skin is warm and dry.  Neurological:     Mental Status: She is alert and oriented to person, place, and time.    Results for orders placed or performed in visit on 09/26/23  POC HgB A1c  Result Value Ref Range   Hemoglobin A1C 5.5 4.0 - 5.6 %   HbA1c POC (<> result, manual entry)     HbA1c, POC (prediabetic range)     HbA1c, POC (controlled diabetic range)        Assessment & Plan:  Type 2 diabetes mellitus with other specified complication, without long-term current use of insulin  (HCC) -  POCT glycosylated hemoglobin (Hb A1C) -     Semaglutide (0.25 or 0.5MG /DOS); Inject 0.5 mg into the skin once a week.  Dispense: 3 mL; Refill: 11  Essential hypertension -     Blood Pressure Cuff; 1 Device by Does not apply route as directed.  Dispense: 1 each; Refill: 0 -     Losartan  Potassium-HCTZ; Take 1 tablet by mouth daily.  Dispense: 90 tablet; Refill: 3  Seasonal allergies  Mixed hyperlipidemia  DM well controlled.  Hemoglobin A1c 7.1% on 06/20/2023.  Now 5.5%.  Will continue Ozempic  0.5 mg weekly and Jardiance  25 mg daily.  Send in new Rx for Ozempic  0.5 mg weekly.  Will discontinue metformin  500 mg twice daily, restart if needed.  Eye exam done.  Will obtain records.  Continue pravastatin  20 mg daily.  Continue ARB.  BP uncontrolled this visit.  Continue losartan -hydrochlorothiazide  50-12.5 mg daily.  Lifestyle modifications encouraged.  Given Rx for BP cuff.  Patient to monitor at home.  For continued elevation consider greater than 140/90 increase losartan -HCTZ to 100-12.5 mg daily.  Continue allergy eyedrops.   Of note: Rx for losartan -hydrochlorothiazide  50-12.5 inadvertently discontinued on med list.  Reordered this visit though patient does not need refills at this time.  Return in about 3 months (around 12/26/2023).   Viola Greulich, MD

## 2023-09-26 NOTE — Patient Instructions (Addendum)
 Hold the Metformin  as your Hgb A1C is now 5.5%.  continue Jardiance  daily and Ozempic  weekly.  Get a blood pressure cuff.  You can buy these at your local drugstore or online.  Monitor your blood pressure daily.  Cut down on the intake of balsamic vinaigrette.  If your BP continues to remain greater then 140/90 we will increase the losartan -hydrochlorothiazide  from 50-12 0.5-1 100-12.5 mg daily.  Do not forget to schedule a yearly eye exam to screen for diabetic retinopathy.

## 2023-11-15 ENCOUNTER — Ambulatory Visit: Payer: Self-pay | Admitting: Obstetrics and Gynecology

## 2023-11-15 ENCOUNTER — Ambulatory Visit (INDEPENDENT_AMBULATORY_CARE_PROVIDER_SITE_OTHER): Payer: 59 | Admitting: Obstetrics and Gynecology

## 2023-11-15 ENCOUNTER — Encounter: Payer: Self-pay | Admitting: Obstetrics and Gynecology

## 2023-11-15 ENCOUNTER — Other Ambulatory Visit (HOSPITAL_COMMUNITY)
Admission: RE | Admit: 2023-11-15 | Discharge: 2023-11-15 | Disposition: A | Discharge status: Home / Self Care | Source: Ambulatory Visit | Attending: Obstetrics and Gynecology | Admitting: Obstetrics and Gynecology

## 2023-11-15 VITALS — BP 126/84 | HR 93 | Ht 66.5 in | Wt 200.0 lb

## 2023-11-15 DIAGNOSIS — Z01419 Encounter for gynecological examination (general) (routine) without abnormal findings: Secondary | ICD-10-CM | POA: Diagnosis not present

## 2023-11-15 DIAGNOSIS — N939 Abnormal uterine and vaginal bleeding, unspecified: Secondary | ICD-10-CM

## 2023-11-15 DIAGNOSIS — Z3041 Encounter for surveillance of contraceptive pills: Secondary | ICD-10-CM

## 2023-11-15 DIAGNOSIS — Z113 Encounter for screening for infections with a predominantly sexual mode of transmission: Secondary | ICD-10-CM | POA: Insufficient documentation

## 2023-11-15 DIAGNOSIS — Z1331 Encounter for screening for depression: Secondary | ICD-10-CM

## 2023-11-15 DIAGNOSIS — Z1159 Encounter for screening for other viral diseases: Secondary | ICD-10-CM

## 2023-11-15 DIAGNOSIS — Z114 Encounter for screening for human immunodeficiency virus [HIV]: Secondary | ICD-10-CM

## 2023-11-15 DIAGNOSIS — D259 Leiomyoma of uterus, unspecified: Secondary | ICD-10-CM | POA: Diagnosis not present

## 2023-11-15 LAB — PREGNANCY, URINE: Preg Test, Ur: NEGATIVE

## 2023-11-15 MED ORDER — NORETHINDRONE 0.35 MG PO TABS
ORAL_TABLET | ORAL | 3 refills | Status: AC
Start: 2023-11-15 — End: ?

## 2023-11-15 NOTE — Patient Instructions (Signed)

## 2023-11-15 NOTE — Progress Notes (Signed)
 48 y.o. G0P0000 Single African American female here for annual exam.  Has been having some pain on her left side thinks it could be her fibroids.  Cramping 2 days ago that lasted all the day.  Had bleeding at the same time. Took Tylenol , which was helpful.  This occurred the week before also.  Last sexual activity 6 weeks ago.   Has a left renal stone and a 3.4 cm uterine fibroid.   Desires STD screening.   Having plumbing issues at home.  Father is there to help.  PCP: Viola Greulich, MD   Patient's last menstrual period was 10/28/2023 (exact date).     Period Cycle (Days): 28 Period Duration (Days): 5 Period Pattern: Regular Menstrual Flow: Moderate Menstrual Control: Maxi pad Dysmenorrhea: (!) Mild Dysmenorrhea Symptoms: Cramping     Sexually active: Yes.    The current method of family planning is POP. Menopausal hormone therapy:  n/a Exercising: Yes.    Core workout, cycle, walking, water aerobics  Smoker:  no  OB History  Gravida Para Term Preterm AB Living  0 0 0 0 0 0  SAB IAB Ectopic Multiple Live Births  0 0 0 0 0     HEALTH MAINTENANCE: Last 2 paps:  11/01/22 neg HR HPV neg, 10/05/21 neg HR HPV neg  History of abnormal Pap or positive HPV:  yes, hx LGSIL, pos HR HPV Mammogram:   01/18/23 Breast Density Cat C, BIRADS Cat 1 neg  Colonoscopy:  06/19/20 Bone Density:  n/a  Result  n/a   Immunization History  Administered Date(s) Administered   HPV 9-valent 10/31/2018, 01/08/2019, 05/10/2019   Influenza, Seasonal, Injecte, Preservative Fre 03/18/2023   Influenza,inj,Quad PF,6+ Mos 03/15/2014, 04/03/2015, 02/26/2016, 03/10/2017, 03/21/2018, 02/28/2019, 02/29/2020, 04/22/2021, 03/18/2022   Moderna SARS-COV2 Booster Vaccination 04/28/2020   Moderna Sars-Covid-2 Vaccination 08/02/2019, 09/04/2019, 04/28/2021   PNEUMOCOCCAL CONJUGATE-20 06/20/2023   Pneumococcal Polysaccharide-23 01/02/2014   Tdap 03/19/2013, 11/01/2022      reports that she has never smoked. She  has never used smokeless tobacco. She reports that she does not currently use alcohol. She reports that she does not use drugs.  Past Medical History:  Diagnosis Date   Abnormal Pap smear of cervix - hpv per report in 2014, seeing gyn 11/16/2013   normal pap with gyn 2015   Anemia    Arthritis    hand   CIN I (cervical intraepithelial neoplasia I) 2020   Diabetes mellitus without complication (HCC)    Family history of bladder cancer    Family history of breast cancer    Family history of kidney cancer    Family history of ovarian cancer    Family history of prostate cancer    Foreign body of hand, right 07/2011   right MP   GERD (gastroesophageal reflux disease)    Headache(784.0)    sinus   HSV-1 (herpes simplex virus 1) infection    Hyperlipidemia    Hypertension    denies HTN, but takes Hyzaar    Kidney stones    Papanicolaou smear of cervix with positive high risk human papilloma virus (HPV) test 2021   Needs cervical cancer screenig in 2022   Stenosing tenosynovitis of wrist 07/2011   right    Past Surgical History:  Procedure Laterality Date   CYST EXCISION     right thumb   DORSAL COMPARTMENT RELEASE  08/26/2011   Procedure: RELEASE DORSAL COMPARTMENT (DEQUERVAIN);  Surgeon: Amelie Baize., MD;  Location: [facility];  Service: Orthopedics;  Laterality: Right;   FOREIGN BODY REMOVAL  08/26/2011   Procedure: FOREIGN BODY REMOVAL ADULT;  Surgeon: Amelie Baize., MD;  Location: [facility];  Service: Orthopedics;  Laterality: Right;   KNEE ARTHROSCOPY Right 06/10/2021    Current Outpatient Medications  Medication Sig Dispense Refill   acetaminophen  (TYLENOL ) 650 MG CR tablet Take 650 mg by mouth every 8 (eight) hours as needed for pain.     Ascorbic Acid (VITAMIN C PO) Take 750 mg by mouth daily.     blood glucose meter kit and supplies KIT Dispense based on patient and insurance preference. Use up to three times daily as  directed. (FOR ICD-9 250.00, 250.01). 1 each 0   Blood Pressure Monitoring (BLOOD PRESSURE CUFF) MISC 1 Device by Does not apply route as directed. 1 each 0   CINNAMON PO Take by mouth.     [employer] SUNSCREEN SPF 30 EX apply topically to face and body daily for 30     cyanocobalamin  (VITAMIN B12) 1000 MCG tablet Take by mouth.     ELDERBERRY PO Take by mouth.     ferrous sulfate 325 (65 FE) MG EC tablet Take 325 mg by mouth daily.     fluticasone (FLONASE) 50 MCG/ACT nasal spray Place into both nostrils daily.     FOLIC ACID  PO Folic Acid      IBUPROFEN PO Take by mouth as needed. Knee pain     JARDIANCE  25 MG TABS tablet TAKE 1 TABLET BY MOUTH DAILY 90 tablet 3   Lancets (ONETOUCH DELICA PLUS LANCET33G) MISC USE AS DIRECTED TO TEST BLOOD SUGAR 1-3 TIMES PER DAY 100 each 2   losartan -hydrochlorothiazide  (HYZAAR) 50-12.5 MG tablet Take 1 tablet by mouth daily. 90 tablet 3   mometasone  (ELOCON ) 0.1 % cream Apply 1 application topically daily. (Patient taking differently: Apply 1 application  topically daily. As needed) 45 g 1   Multiple Vitamins-Minerals (ZINC PO) Take by mouth.     norethindrone  (MICRONOR ) 0.35 MG tablet TAKE 1 TABLET(0.35 MG) BY MOUTH DAILY (Patient taking differently: Take 1 tablet by mouth daily. Jencycla . TAKE 1 TABLET(0.35 MG) BY MOUTH DAILY) 84 tablet 3   Omega-3 Fatty Acids (FISH OIL) 1000 MG CAPS Take by mouth.     omeprazole  (PRILOSEC) 20 MG capsule TAKE 1 CAPSULE BY MOUTH DAILY 90 capsule 1   pimecrolimus (ELIDEL) 1 % cream Apply topically.     potassium chloride  (KLOR-CON ) 10 MEQ tablet TAKE 1 TABLET BY MOUTH DAILY 90 tablet 2   pravastatin  (PRAVACHOL ) 20 MG tablet Take 1 tablet (20 mg total) by mouth daily. 90 tablet 3   SALINE NASAL SPRAY NA Place into the nose as needed.     Semaglutide ,0.25 or 0.5MG /DOS, 2 MG/3ML SOPN Inject 0.5 mg into the skin once a week. 3 mL 11   tamsulosin  (FLOMAX ) 0.4 MG CAPS capsule TAKE 1 CAPSULE BY MOUTH DAILY 30 capsule 1   triamcinolone   cream (KENALOG ) 0.1 % Apply topically daily. 30 g 1   TURMERIC CURCUMIN PO Take by mouth.     valACYclovir  (VALTREX ) 500 MG tablet Take 1 tablet (500 mg total) by mouth daily. 90 tablet 1   zinc gluconate 50 MG tablet Take 50 mg by mouth daily.     No current facility-administered medications for this visit.    ALLERGIES: Codeine, Oak bark [quercus robur], and Soap  Family History  Problem Relation Age of Onset   Hyperlipidemia Mother  Hypertension Mother    Hypertension Father    Stroke Father    Hypertension Sister    Diabetes Sister    Arthritis Maternal Grandmother    Kidney failure Maternal Grandmother    Heart failure Maternal Grandmother    Heart failure Maternal Grandfather    Diabetes Maternal Grandfather    Breast cancer Paternal Grandmother        Age 51s or 84s   Diabetes Mellitus II Paternal Grandmother    Dementia Paternal Grandmother    Throat cancer Paternal Grandmother    Stroke Paternal Grandfather    Cancer Paternal Aunt        bladder or kidney cancer, Age 43/58    Ovarian cancer Cousin 32       maternal uncle's daughter   Colon cancer Other        maternal great-aunt, in her 66s   Ovarian cancer Other        matrenal great-aunt, in her 16s   Esophageal cancer Neg Hx    Stomach cancer Neg Hx    Rectal cancer Neg Hx     Review of Systems  All other systems reviewed and are negative.   PHYSICAL EXAM:  BP 126/84 (BP Location: Left Arm, Patient Position: Sitting)   Pulse 93   Ht 5' 6.5 (1.689 m)   Wt 200 lb (90.7 kg)   LMP 10/28/2023 (Exact Date)   SpO2 98%   BMI 31.80 kg/m     General appearance: alert, cooperative and appears stated age Head: normocephalic, without obvious abnormality, atraumatic Neck: no adenopathy, supple, symmetrical, trachea midline and thyroid normal to inspection and palpation Lungs: clear to auscultation bilaterally Breasts: normal appearance, no masses or tenderness, No nipple retraction or dimpling, No nipple  discharge or bleeding, No axillary adenopathy Heart: regular rate and rhythm Abdomen: soft, non-tender; no masses, no organomegaly Extremities: extremities normal, atraumatic, no cyanosis or edema Skin: skin color, texture, turgor normal. No rashes or lesions Lymph nodes: cervical, supraclavicular, and axillary nodes normal. Neurologic: grossly normal  Pelvic: External genitalia:  no lesions              No abnormal inguinal nodes palpated.              Urethra:  normal appearing urethra with no masses, tenderness or lesions              Bartholins and Skenes: normal                 Vagina: normal appearing vagina with normal color and discharge, no lesions              Cervix: no lesions.  Small amount of mucous and blood from os.               Pap taken: no Bimanual Exam:  Uterus:  normal size, contour, position, consistency, mobility, non-tender              Adnexa: no mass, fullness, tenderness              Rectal exam: yes.  Confirms.              Anus:  normal sphincter tone, no lesions  Chaperone was present for exam:  Cottie Diss, CMA  ASSESSMENT:  Well woman visit with gynecologic exam. Hx prior positive LGSIL and positive HR HPV.  Uterine fibroid. STD screening.  Abnormal uterine bleeding.  Birth control pill surveillance.  Hx HSV 1.  PCP currently  prescribing.  DM. FH cancers.  Negative genetic testing.   Variants of unknown significance noted.  Left renal stone. PHQ-9: 0  PLAN:  Mammogram screening discussed. Self breast awareness reviewed. Pap and HRV collected:  no.  Due 2027 Guidelines for Calcium, Vitamin D , regular exercise program including cardiovascular and weight bearing exercise. Medication refills:  Micronor .  UPT:  negative.  STD screening.  Routine labs with PCP.  Return for pelvic US . Follow up:  yearly and prn.

## 2023-11-16 LAB — RPR: RPR Ser Ql: NONREACTIVE

## 2023-11-16 LAB — CERVICOVAGINAL ANCILLARY ONLY
Chlamydia: NEGATIVE
Comment: NEGATIVE
Comment: NEGATIVE
Comment: NORMAL
Neisseria Gonorrhea: NEGATIVE
Trichomonas: NEGATIVE

## 2023-11-16 LAB — HIV ANTIBODY (ROUTINE TESTING W REFLEX): HIV 1&2 Ab, 4th Generation: NONREACTIVE

## 2023-11-16 LAB — HEPATITIS C ANTIBODY: Hepatitis C Ab: NONREACTIVE

## 2023-11-22 ENCOUNTER — Encounter: Payer: Self-pay | Admitting: Obstetrics and Gynecology

## 2023-11-28 ENCOUNTER — Other Ambulatory Visit: Payer: Self-pay | Admitting: *Deleted

## 2023-11-28 NOTE — Telephone Encounter (Signed)
 Patient left message requesting update on OCP Rx. Period is ending, would like status update.   Spoke with patient, advised Rx for Micronor  sent to [facility] for 90 day supply with 3 refills was sent on 11/15/23. Patient states automated system states no refills remaining. Reccommended patient contact the pharmacy directly for refill, return call to office if any additional assistance is needed. Patient agreeable.   Encounter closed.

## 2023-12-26 ENCOUNTER — Encounter: Payer: Self-pay | Admitting: Family Medicine

## 2023-12-26 ENCOUNTER — Ambulatory Visit: Admitting: Family Medicine

## 2023-12-26 VITALS — BP 140/86 | HR 93 | Temp 98.6°F | Ht 66.5 in | Wt 207.6 lb

## 2023-12-26 DIAGNOSIS — E1169 Type 2 diabetes mellitus with other specified complication: Secondary | ICD-10-CM | POA: Diagnosis not present

## 2023-12-26 DIAGNOSIS — I10 Essential (primary) hypertension: Secondary | ICD-10-CM | POA: Diagnosis not present

## 2023-12-26 DIAGNOSIS — E782 Mixed hyperlipidemia: Secondary | ICD-10-CM

## 2023-12-26 DIAGNOSIS — K219 Gastro-esophageal reflux disease without esophagitis: Secondary | ICD-10-CM

## 2023-12-26 DIAGNOSIS — Z7985 Long-term (current) use of injectable non-insulin antidiabetic drugs: Secondary | ICD-10-CM

## 2023-12-26 LAB — MICROALBUMIN / CREATININE URINE RATIO
Creatinine,U: 64.9 mg/dL
Microalb Creat Ratio: UNDETERMINED mg/g (ref 0.0–30.0)
Microalb, Ur: <0.7 mg/dL

## 2023-12-26 MED ORDER — PRAVASTATIN SODIUM 20 MG PO TABS
20.0000 mg | ORAL_TABLET | Freq: Every day | ORAL | 3 refills | Status: AC
Start: 2023-12-26 — End: ?

## 2023-12-26 MED ORDER — OMEPRAZOLE 20 MG PO CPDR
20.0000 mg | DELAYED_RELEASE_CAPSULE | Freq: Every day | ORAL | 3 refills | Status: AC
Start: 2023-12-26 — End: ?

## 2023-12-26 MED ORDER — POTASSIUM CHLORIDE ER 10 MEQ PO TBCR
10.0000 meq | EXTENDED_RELEASE_TABLET | Freq: Every day | ORAL | 3 refills | Status: AC
Start: 2023-12-26 — End: ?

## 2023-12-26 MED ORDER — JARDIANCE 25 MG PO TABS: 25.0000 mg | ORAL_TABLET | Freq: Every day | ORAL | 3 refills | Status: AC

## 2023-12-26 MED ORDER — LOSARTAN POTASSIUM-HCTZ 50-12.5 MG PO TABS: 1.0000 | ORAL_TABLET | Freq: Every day | ORAL | 3 refills | Status: AC

## 2023-12-26 MED ORDER — SEMAGLUTIDE(0.25 OR 0.5MG/DOS) 2 MG/3ML ~~LOC~~ SOPN: 0.5000 mg | PEN_INJECTOR | SUBCUTANEOUS | 11 refills | Status: AC

## 2023-12-26 NOTE — Progress Notes (Signed)
 Established Patient Office Visit   Subjective  Patient ID: Linda Conrad, female    DOB: 1975/09/05  Age: 48 y.o. MRN: [id number]  Chief Complaint  Patient presents with   Medical Management of Chronic Issues    Patient came in today for a 3 month follow-up for Diabetes and Blood pressure,     Pt is a 48 year old female seen for follow-up.  Patient not checking BP or blood sugar at home.  Start taking Ozempic  as she did not feel like she needed it anymore.  Patient stopped drinking Coca-Cola's.  Had to fried pork chop last night for dinner which may be contributing to blood pressure elevation.    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   Anemia 08/07/2017   Hx of abnormal cervical Pap smear 08/01/2015   Type 2 diabetes mellitus with other specified complication (HCC) 09/13/2014   Hypertension associated with diabetes (HCC) 11/16/2013   GERD (gastroesophageal reflux disease) 11/16/2013   Hearing loss - followed by [facility] 11/16/2013   Past Medical History:  Diagnosis Date   Abnormal Pap smear of cervix - hpv per report in 2014, seeing gyn 11/16/2013   normal pap with gyn 2015   Anemia    Arthritis    hand   CIN I (cervical intraepithelial neoplasia I) 2020   Diabetes mellitus without complication (HCC)    Family history of bladder cancer    Family history of breast cancer    Family history of kidney cancer    Family history of ovarian cancer    Family history of prostate cancer    Foreign body of hand, right 07/2011   right MP   GERD (gastroesophageal reflux disease)    Headache(784.0)    sinus   HSV-1 (herpes simplex virus 1) infection    Hyperlipidemia    Hypertension    denies HTN, but takes Hyzaar     Kidney stones    Papanicolaou smear of cervix with positive high risk human papilloma virus (HPV) test 2021   Needs cervical cancer screenig in 2022   Stenosing tenosynovitis of wrist 07/2011   right   Past Surgical History:  Procedure Laterality Date   CYST EXCISION     right thumb   DORSAL COMPARTMENT RELEASE  08/26/2011   Procedure: RELEASE DORSAL COMPARTMENT (DEQUERVAIN);  Surgeon: Lamar LULLA Leonor Mickey., MD;  Location: [facility];  Service: Orthopedics;  Laterality: Right;   FOREIGN BODY REMOVAL  08/26/2011   Procedure: FOREIGN BODY REMOVAL ADULT;  Surgeon: Lamar LULLA Leonor Mickey., MD;  Location: [facility];  Service: Orthopedics;  Laterality: Right;   KNEE ARTHROSCOPY Right 06/10/2021   Social History   Tobacco Use   Smoking status: Never   Smokeless tobacco: Never  Vaping Use   Vaping status: Never Used  Substance Use Topics   Alcohol use: Not Currently   Drug use: No   Family History  Problem Relation Age of Onset   Hyperlipidemia Mother    Hypertension Mother    Hypertension Father    Stroke Father    Hypertension Sister    Diabetes Sister    Arthritis Maternal Grandmother    Kidney  failure Maternal Grandmother    Heart failure Maternal Grandmother    Heart failure Maternal Grandfather    Diabetes Maternal Grandfather    Breast cancer Paternal Grandmother        Age 72s or 104s   Diabetes Mellitus II Paternal Grandmother    Dementia Paternal Grandmother    Throat cancer Paternal Grandmother    Stroke Paternal Grandfather    Cancer Paternal Aunt        bladder or kidney cancer, Age 55/58    Ovarian cancer Cousin 27       maternal uncle's daughter   Colon cancer Other        maternal great-aunt, in her 94s   Ovarian cancer Other        matrenal great-aunt, in her 70s   Esophageal cancer Neg Hx    Stomach cancer Neg Hx    Rectal cancer Neg Hx    Allergies  Allergen Reactions   Codeine Other (See Comments)    Family hx of allergy    Oak Bark [Quercus Robur]     Oak trees   Soap Rash    Everything except Dove    ROS Negative unless stated above    Objective:     BP (!) 140/86 (BP Location: Right Arm, Patient Position: Sitting, Cuff Size: Large)   Pulse 93   Temp 98.6 F (37 C) (Oral)   Ht 5' 6.5 (1.689 m)   Wt 207 lb 9.6 oz (94.2 kg)   LMP  (LMP Unknown)   SpO2 99%   BMI 33.01 kg/m  BP Readings from Last 3 Encounters:  12/26/23 (!) 140/86  11/15/23 126/84  09/26/23 (!) 156/84   Wt Readings from Last 3 Encounters:  12/26/23 207 lb 9.6 oz (94.2 kg)  11/15/23 200 lb (90.7 kg)  09/26/23 203 lb 3.2 oz (92.2 kg)     Physical Exam Constitutional:      General: She is not in acute distress.    Appearance: Normal appearance.  HENT:     Head: Normocephalic and atraumatic.     Nose: Nose normal.     Mouth/Throat:     Mouth: Mucous membranes are moist.  Cardiovascular:     Rate and Rhythm: Normal rate and regular rhythm.     Heart sounds: Normal heart sounds. No murmur heard.    No gallop.  Pulmonary:     Effort: Pulmonary effort is normal. No respiratory distress.     Breath sounds: Normal breath sounds. No wheezing, rhonchi or rales.  Skin:    General: Skin is warm and dry.  Neurological:     Mental Status: She is alert and oriented to person, place, and time.        12/26/2023   11:57 AM 11/15/2023   11:11 AM 09/26/2023   12:04 PM  Depression screen PHQ 2/9  Decreased Interest 0 0 0  Down, Depressed, Hopeless 0 0 0  PHQ - 2 Score 0 0 0  Altered sleeping 0  0  Tired, decreased energy 1  1  Change in appetite 1  0  Feeling bad or failure about yourself  0  0  Trouble concentrating 0  0  Moving slowly or fidgety/restless 0  0  Suicidal thoughts 0  0  PHQ-9 Score 2  1  Difficult doing work/chores Not difficult at all  Not difficult at all      12/26/2023   11:57 AM 09/26/2023   12:04 PM 06/20/2023   11:49  AM 03/18/2023    1:48 PM  GAD 7 : Generalized Anxiety Score  Nervous,  Anxious, on Edge 0 0 0 0  Control/stop worrying 0 0 0 0  Worry too much - different things 0 0 0 0  Trouble relaxing 1 1 1 1   Restless 0 0 0 0  Easily annoyed or irritable 1 1 1 1   Afraid - awful might happen 0 0 0 0  Total GAD 7 Score 2 2 2 2   Anxiety Difficulty Not difficult at all Not difficult at all Not difficult at all      No results found for any visits on 12/26/23.    Assessment & Plan:   Type 2 diabetes mellitus with other specified complication, without long-term current use of insulin (HCC) -     Microalbumin / creatinine urine ratio -     Jardiance ; Take 1 tablet (25 mg total) by mouth daily.  Dispense: 90 tablet; Refill: 3 -     Semaglutide (0.25 or 0.5MG /DOS); Inject 0.5 mg into the skin once a week.  Dispense: 3 mL; Refill: 11  Essential hypertension -     Potassium Chloride  ER; Take 1 tablet (10 mEq total) by mouth daily.  Dispense: 90 tablet; Refill: 3 -     Losartan  Potassium-HCTZ; Take 1 tablet by mouth daily.  Dispense: 90 tablet; Refill: 3  Mixed hyperlipidemia -     Pravastatin  Sodium; Take 1 tablet (20 mg total) by mouth daily.  Dispense: 90 tablet; Refill: 3  Gastroesophageal reflux disease, unspecified whether esophagitis present -     Omeprazole ; Take 1 capsule (20 mg total) by mouth daily.  Dispense: 90 capsule; Refill: 3  Diabetes controlled.  Hemoglobin A1c 5.5% on 09/26/2023.  A1c was as high as 12.8% on 03/18/2023.  Patient advised to restart Jardiance  25 mg daily and Ozempic  0.5 mg weekly.  Discussed the importance of lifestyle modifications.  Including continuing to decrease sodium intake and intake of fried foods.  Continue losartan -hydrochlorothiazide  50-12.5 mg daily.  Continue pravastatin  20 mg daily.  Return in about 3 months (around 03/27/2024).   Clotilda JONELLE Single, MD

## 2024-01-04 NOTE — Progress Notes (Signed)
 GYNECOLOGY  VISIT   HPI: 48 y.o.   Single  African American female   G0P0000 with No LMP recorded (lmp unknown). (Menstrual status: Oral contraceptives).   here for: US  Consult for cramping accompanied by bleeding.   Menstruation occurs for 5 days per month. Can have spotting that occurs weeks later and lasts for 1 - 2 days.   Spotting may recur again prior to her period starting.  Bleeding is not heavy.   No period the month of July. LMP was 11/25/23.   No skipped or late pills.   Not sexually active since June 17.    Neg STD check at annual exam.   GYNECOLOGIC HISTORY: No LMP recorded (lmp unknown). (Menstrual status: Oral contraceptives). Contraception:  Oral, Norethindrone  Menopausal hormone therapy:  n/a Last 2 paps:  11/01/22 neg HR HPV neg, 10/05/21 neg HR HPV neg  History of abnormal Pap or positive HPV:  yes Mammogram:  01/18/23 Breast Density Cat C, BIRADS Cat 1 neg         OB History     Gravida  0   Para  0   Term  0   Preterm  0   AB  0   Living  0      SAB  0   IAB  0   Ectopic  0   Multiple  0   Live Births  0              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   Anemia 08/07/2017   Hx of abnormal cervical Pap smear 08/01/2015   Type 2 diabetes mellitus with other specified complication (HCC) 09/13/2014   Hypertension associated with diabetes (HCC) 11/16/2013   GERD (gastroesophageal reflux disease) 11/16/2013   Hearing loss - followed by [facility] 11/16/2013    Past Medical History:  Diagnosis Date   Abnormal Pap smear of cervix - hpv per report in 2014, seeing gyn 11/16/2013   normal pap with gyn 2015   Anemia    Arthritis    hand   CIN I (cervical intraepithelial  neoplasia I) 2020   Diabetes mellitus without complication (HCC)    Family history of bladder cancer    Family history of breast cancer    Family history of kidney cancer    Family history of ovarian cancer    Family history of prostate cancer    Foreign body of hand, right 07/2011   right MP   GERD (gastroesophageal reflux disease)    Headache(784.0)    sinus   HSV-1 (herpes simplex virus 1) infection    Hyperlipidemia    Hypertension    denies HTN, but takes Hyzaar    Kidney stones    Papanicolaou smear of cervix with positive high risk human papilloma virus (HPV) test 2021   Needs cervical cancer screenig in 2022   Stenosing tenosynovitis of wrist 07/2011   right    Past Surgical History:  Procedure Laterality Date   CYST EXCISION     right thumb   DORSAL COMPARTMENT RELEASE  08/26/2011   Procedure: RELEASE DORSAL COMPARTMENT (DEQUERVAIN);  Surgeon: Lamar LULLA Leonor Mickey., MD;  Location: [facility];  Service: Orthopedics;  Laterality: Right;   FOREIGN BODY REMOVAL  08/26/2011   Procedure: FOREIGN BODY REMOVAL ADULT;  Surgeon: Lamar LULLA Leonor Mickey., MD;  Location: [facility];  Service: Orthopedics;  Laterality: Right;   KNEE ARTHROSCOPY Right 06/10/2021    Current Outpatient Medications  Medication Sig Dispense Refill   acetaminophen  (TYLENOL ) 650 MG CR tablet Take 650 mg by mouth every 8 (eight) hours as needed for pain.     Ascorbic Acid (VITAMIN C PO) Take 750 mg by mouth daily.     blood glucose meter kit and supplies KIT Dispense based on patient and insurance preference. Use up to three times daily as directed. (FOR ICD-9 250.00, 250.01). 1 each 0   Blood Pressure Monitoring (BLOOD PRESSURE CUFF) MISC 1 Device by Does not apply route as directed. 1 each 0   CINNAMON PO Take by mouth.     [employer] SUNSCREEN SPF 30 EX apply topically to face and body daily for 30     cyanocobalamin  (VITAMIN B12) 1000 MCG tablet Take by mouth.     DERMA-SMOOTHE/FS  SCALP 0.01 % OIL 1 Application.     ELDERBERRY PO Take by mouth.     ferrous sulfate 325 (65 FE) MG EC tablet Take 325 mg by mouth daily.     fluticasone (FLONASE) 50 MCG/ACT nasal spray Place into both nostrils daily.     FOLIC ACID  PO Folic Acid      IBUPROFEN PO Take by mouth as needed. Knee pain     JARDIANCE  25 MG TABS tablet Take 1 tablet (25 mg total) by mouth daily. 90 tablet 3   ketoconazole (NIZORAL) 2 % shampoo as directed Externally once a week; Duration: 30 days     Lancets (ONETOUCH DELICA PLUS LANCET33G) MISC USE AS DIRECTED TO TEST BLOOD SUGAR 1-3 TIMES PER DAY 100 each 2   losartan -hydrochlorothiazide  (HYZAAR) 50-12.5 MG tablet Take 1 tablet by mouth daily. 90 tablet 3   meloxicam (MOBIC) 15 MG tablet Take 15 mg by mouth daily.     mometasone  (ELOCON ) 0.1 % cream Apply 1 application topically daily. (Patient taking differently: Apply 1 application  topically daily. As needed) 45 g 1   Multiple Vitamins-Minerals (ZINC PO) Take by mouth.     norethindrone  (MICRONOR ) 0.35 MG tablet TAKE 1 TABLET(0.35 MG) BY MOUTH DAILY 84 tablet 3   Omega-3 Fatty Acids (FISH OIL) 1000 MG CAPS Take by mouth.     omeprazole  (PRILOSEC) 20 MG capsule Take 1 capsule (20 mg total) by mouth daily. 90 capsule 3   pimecrolimus (ELIDEL) 1 % cream Apply topically.     potassium chloride  (KLOR-CON ) 10 MEQ tablet Take 1 tablet (10 mEq total) by mouth daily. 90 tablet 3   pravastatin  (PRAVACHOL ) 20 MG tablet Take 1 tablet (20 mg total) by mouth daily. 90 tablet 3   SALINE NASAL SPRAY NA Place into the nose as needed.     Semaglutide ,0.25 or 0.5MG /DOS, 2 MG/3ML SOPN Inject 0.5 mg into the skin once a week. 3 mL 11   tamsulosin  (FLOMAX ) 0.4 MG CAPS capsule TAKE 1 CAPSULE BY MOUTH DAILY 30 capsule 1   triamcinolone  cream (KENALOG ) 0.1 % Apply topically daily. 30 g 1   TURMERIC CURCUMIN PO Take by mouth.     valACYclovir  (VALTREX ) 500 MG tablet Take 1 tablet (500 mg total) by mouth daily. 90 tablet 1   zinc  gluconate 50 MG tablet Take 50 mg by mouth daily.  No current facility-administered medications for this visit.     ALLERGIES: Codeine, Oak bark [quercus robur], and Soap  Family History  Problem Relation Age of Onset   Hyperlipidemia Mother    Hypertension Mother    Hypertension Father    Stroke Father    Hypertension Sister    Diabetes Sister    Arthritis Maternal Grandmother    Kidney failure Maternal Grandmother    Heart failure Maternal Grandmother    Heart failure Maternal Grandfather    Diabetes Maternal Grandfather    Breast cancer Paternal Grandmother        Age 44s or 48s   Diabetes Mellitus II Paternal Grandmother    Dementia Paternal Grandmother    Throat cancer Paternal Grandmother    Stroke Paternal Grandfather    Cancer Paternal Aunt        bladder or kidney cancer, Age 42/58    Ovarian cancer Cousin 85       maternal uncle's daughter   Colon cancer Other        maternal great-aunt, in her 66s   Ovarian cancer Other        matrenal great-aunt, in her 35s   Esophageal cancer Neg Hx    Stomach cancer Neg Hx    Rectal cancer Neg Hx     Social History   Socioeconomic History   Marital status: Single    Spouse name: Not on file   Number of children: 0   Years of education: Not on file   Highest education level: Not on file  Occupational History   Not on file  Tobacco Use   Smoking status: Never   Smokeless tobacco: Never  Vaping Use   Vaping status: Never Used  Substance and Sexual Activity   Alcohol use: Not Currently   Drug use: No   Sexual activity: Yes    Partners: Male    Birth control/protection: Pill    Comment: Micronor   Other Topics Concern   Not on file  Social History Narrative   Work or School: works with senior center      Home Situation: lives alone      Spiritual Beliefs: none, Christian      Lifestyle: active at work but not otherwise; avoiding carbs            Social Drivers of Corporate investment banker  Strain: Not on file  Food Insecurity: Not on file  Transportation Needs: Not on file  Physical Activity: Not on file  Stress: Not on file  Social Connections: Not on file  Intimate Partner Violence: Not on file    Review of Systems  All other systems reviewed and are negative.   PHYSICAL EXAMINATION:   BP 126/84 (BP Location: Left Arm, Patient Position: Sitting)   Pulse 89   LMP  (LMP Unknown)   SpO2 98%     General appearance: alert, cooperative and appears stated age  Pelvic US   Uterus 9.28 x 5.93 x 4.71 cm.  Fibroids intramural and subserosal:  1.29 cm, 0.91 cm, 3.20 cm, 1.07 cm.  EMS 5.63 mm.  Symmetrical, no masses or thickening, avascular.   Left ovary 2.56 x 1.75 x 1.79 cm.  Normal. Right ovary 4.69 x 3.55  2.90 cm.  Simple cyst:  44 x 26 mm.  No adnexal masses.  No free fluid.  ASSESSMENT: Abnormal uterine bleeding on Micronor .  Recent skipped cycle.  Pelvic cramping.  Fibroids.  Simple right ovarian cyst.  HTN.  PLAN: Pelvic US  images and report reviewed.  Fibroids and simple ovarian cyst discussed.  The benign nature of the fibroids and the cyst was reviewed with patient.  Return for EMB.  Rationale discussed to evaluate for hormonal imbalance, polyps, precancer and cancer.  Procedure explained.  Mirena IUD or surgical care may be a treatment options.   25 min  total time was spent for this patient encounter, including preparation, face-to-face counseling with the patient, coordination of care, and documentation of the encounter.

## 2024-01-05 ENCOUNTER — Encounter: Payer: Self-pay | Admitting: Obstetrics and Gynecology

## 2024-01-05 ENCOUNTER — Ambulatory Visit

## 2024-01-05 ENCOUNTER — Ambulatory Visit (INDEPENDENT_AMBULATORY_CARE_PROVIDER_SITE_OTHER): Admitting: Obstetrics and Gynecology

## 2024-01-05 VITALS — BP 126/84 | HR 89

## 2024-01-05 DIAGNOSIS — R102 Pelvic and perineal pain: Secondary | ICD-10-CM | POA: Diagnosis not present

## 2024-01-05 DIAGNOSIS — N83201 Unspecified ovarian cyst, right side: Secondary | ICD-10-CM | POA: Diagnosis not present

## 2024-01-05 DIAGNOSIS — N939 Abnormal uterine and vaginal bleeding, unspecified: Secondary | ICD-10-CM | POA: Diagnosis not present

## 2024-01-05 DIAGNOSIS — D219 Benign neoplasm of connective and other soft tissue, unspecified: Secondary | ICD-10-CM | POA: Diagnosis not present

## 2024-01-05 NOTE — Patient Instructions (Signed)
 Endometrial Biopsy  An endometrial biopsy is a procedure where a tissue sample is removed from the lining of the uterus. This lining is called the endometrium. The tissue sample is then sent to a lab for testing. You may have this type of biopsy to check for: Cancer. Infection. Growths called polyps. Uterine bleeding that can't be explained. Tell a health care provider about: Any allergies you have. All medicines you're taking including vitamins, herbs, eye drops, creams, and over-the-counter medicines. Any problems you or family members have had with anesthesia. Any bleeding problems you have. Any surgeries you have had. Any medical problems you have. Whether you're pregnant or may be pregnant. What are the risks? Your health care provider will talk with you about risks. These may include: Bleeding. Infection. Allergic reactions to medicines. Damage to the wall of the uterus. This is rare. What happens before the procedure? Keep track of your period. You may need to have this biopsy when you're not having your period. Ask your provider about: Changing or stopping your regular medicines. These include any diabetes medicines or blood thinners you take. Taking medicines such as aspirin and ibuprofen. These medicines can thin your blood. Do not take them unless your provider tells you to. Taking over-the-counter medicines, vitamins, herbs, and supplements. Bring a pad with you. You may need to wear one after the biopsy. Plan to have a responsible adult take you home from the hospital or clinic. You won't be allowed to drive. What happens during the procedure? A tool will be put into your vagina to hold it open. This helps your provider see the cervix. The cervix is the lowest part of the uterus. Your cervix will be cleaned with a solution that kills germs. You will be given anesthesia. This keeps you from feeling pain. It will numb your cervix. A tool called forceps will be used to  hold your cervix steady. A thin tool called a uterine sound will be put through your cervix. It will be used to: Find the length of your uterus. Find where to take the sample from. A soft tube called a catheter will be put into your uterus. The catheter will remove a tissue sample. The tube and tools will be removed. The sample will be sent to a lab for testing. The procedure may vary among providers and hospitals. What happens after the procedure? Your blood pressure, heart rate, breathing rate, and blood oxygen level will be monitored until you leave the hospital or clinic. It's up to you to get the results of your procedure. Ask your provider, or the department that is doing the procedure, when your results will be ready. This information is not intended to replace advice given to you by your health care provider. Make sure you discuss any questions you have with your health care provider. Document Revised: 07/27/2022 Document Reviewed: 07/27/2022 Elsevier Patient Education  2024 [employer].

## 2024-01-11 ENCOUNTER — Other Ambulatory Visit: Payer: Self-pay | Admitting: Family Medicine

## 2024-01-11 DIAGNOSIS — Z1231 Encounter for screening mammogram for malignant neoplasm of breast: Secondary | ICD-10-CM

## 2024-01-25 ENCOUNTER — Ambulatory Visit
Admission: RE | Admit: 2024-01-25 | Discharge: 2024-01-25 | Disposition: A | Discharge status: Home / Self Care | Source: Ambulatory Visit | Attending: Family Medicine | Admitting: Family Medicine

## 2024-01-25 DIAGNOSIS — Z1231 Encounter for screening mammogram for malignant neoplasm of breast: Secondary | ICD-10-CM

## 2024-02-14 ENCOUNTER — Ambulatory Visit: Admitting: Family Medicine

## 2024-02-14 ENCOUNTER — Encounter: Payer: Self-pay | Admitting: Family Medicine

## 2024-02-14 VITALS — BP 150/92 | HR 94 | Temp 98.2°F | Wt 207.0 lb

## 2024-02-14 DIAGNOSIS — B958 Unspecified staphylococcus as the cause of diseases classified elsewhere: Secondary | ICD-10-CM | POA: Diagnosis not present

## 2024-02-14 DIAGNOSIS — J019 Acute sinusitis, unspecified: Secondary | ICD-10-CM

## 2024-02-14 MED ORDER — MUPIROCIN 2 % EX OINT: 1.0000 | TOPICAL_OINTMENT | Freq: Three times a day (TID) | CUTANEOUS | 0 refills | Status: AC

## 2024-02-14 MED ORDER — AMOXICILLIN-POT CLAVULANATE 875-125 MG PO TABS: 1.0000 | ORAL_TABLET | Freq: Two times a day (BID) | ORAL | 0 refills | Status: DC

## 2024-02-14 NOTE — Progress Notes (Signed)
   Subjective:    Patient ID: Linda Conrad, female    DOB: 07-28-1975, 48 y.o.   MRN: [id number]  HPI Here for 6 days of sinus congestion, blowing yellow mucus from the nose, and a dry cough. No fever. Using Mucinex. She has also had an irritated spot inside the right nostril for a few days.    Review of Systems  Constitutional: Negative.   HENT:  Positive for congestion, postnasal drip and sinus pressure. Negative for nosebleeds and sore throat.   Eyes: Negative.   Respiratory:  Positive for cough. Negative for shortness of breath and wheezing.        Objective:   Physical Exam Constitutional:      Appearance: Normal appearance.  HENT:     Right Ear: Tympanic membrane, ear canal and external ear normal.     Left Ear: Tympanic membrane, ear canal and external ear normal.     Nose:     Comments: There is an area of scabbing and tenderness in the right nostril     Mouth/Throat:     Pharynx: Oropharynx is clear.  Eyes:     Conjunctiva/sclera: Conjunctivae normal.  Pulmonary:     Effort: Pulmonary effort is normal.     Breath sounds: Normal breath sounds.  Lymphadenopathy:     Cervical: No cervical adenopathy.  Neurological:     Mental Status: She is alert.           Assessment & Plan:  She has a sinusitis, and we will treat this with 10 days of Augmentin . She also has a Staph infection in the nose, and we will treat this with Mupiricin ointment until clear.  Garnette Olmsted, MD

## 2024-02-16 ENCOUNTER — Ambulatory Visit: Admitting: Family Medicine

## 2024-02-29 ENCOUNTER — Other Ambulatory Visit (HOSPITAL_COMMUNITY)
Admission: RE | Admit: 2024-02-29 | Discharge: 2024-02-29 | Disposition: A | Discharge status: Home / Self Care | Source: Ambulatory Visit | Attending: Obstetrics and Gynecology | Admitting: Obstetrics and Gynecology

## 2024-02-29 ENCOUNTER — Encounter: Payer: Self-pay | Admitting: Obstetrics and Gynecology

## 2024-02-29 ENCOUNTER — Ambulatory Visit: Admitting: Obstetrics and Gynecology

## 2024-02-29 VITALS — BP 124/82 | HR 100

## 2024-02-29 DIAGNOSIS — N939 Abnormal uterine and vaginal bleeding, unspecified: Secondary | ICD-10-CM

## 2024-02-29 DIAGNOSIS — Z01812 Encounter for preprocedural laboratory examination: Secondary | ICD-10-CM

## 2024-02-29 DIAGNOSIS — N926 Irregular menstruation, unspecified: Secondary | ICD-10-CM | POA: Diagnosis present

## 2024-02-29 LAB — PREGNANCY, URINE: Preg Test, Ur: NEGATIVE

## 2024-02-29 NOTE — Progress Notes (Signed)
 GYNECOLOGY  VISIT   HPI: 48 y.o.   Single  African American female   G0P0000 with Patient's last menstrual period was 02/20/2024 (exact date).   here for: Endometrial biopsy for irregular vaginal bleeding on Micronor .   Menses 01/20/24 and then 02/20/24. Bleeding was more watery.   Some cramping.    Can skip or take her pills late.     Used Depo Provera as a teen.    Pelvic US   Uterus 9.28 x 5.93 x 4.71 cm.  Fibroids intramural and subserosal:  1.29 cm, 0.91 cm, 3.20 cm, 1.07 cm.  EMS 5.63 mm.  Symmetrical, no masses or thickening, avascular.   Left ovary 2.56 x 1.75 x 1.79 cm.  Normal. Right ovary 4.69 x 3.55  2.90 cm.  Simple cyst:  44 x 26 mm.  No adnexal masses.  No free fluid.  UPT - neg  GYNECOLOGIC HISTORY: Patient's last menstrual period was 02/20/2024 (exact date). Contraception:  POP  Menopausal hormone therapy:  Micronor   Last 2 paps:  11/01/22 neg HR HPV neg, 10/05/21 neg HR HPV neg   History of abnormal Pap or positive HPV:  yes Mammogram:  01/25/24 Breast Density Cat C, BIRADS Cat 1 neg         OB History     Gravida  0   Para  0   Term  0   Preterm  0   AB  0   Living  0      SAB  0   IAB  0   Ectopic  0   Multiple  0   Live Births  0              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   Anemia 08/07/2017   Hx of abnormal cervical Pap smear 08/01/2015   Type 2 diabetes mellitus with other specified complication (HCC) 09/13/2014   Hypertension associated with diabetes (HCC) 11/16/2013   GERD (gastroesophageal reflux disease) 11/16/2013   Hearing loss - followed by [facility] 11/16/2013    Past Medical History:  Diagnosis Date   Abnormal Pap smear of cervix - hpv per report  in 2014, seeing gyn 11/16/2013   normal pap with gyn 2015   Anemia    Arthritis    hand   CIN I (cervical intraepithelial neoplasia I) 2020   Diabetes mellitus without complication (HCC)    Family history of bladder cancer    Family history of breast cancer    Family history of kidney cancer    Family history of ovarian cancer    Family history of prostate cancer    Foreign body of hand, right 07/2011   right MP   GERD (gastroesophageal reflux disease)    Headache(784.0)    sinus   HSV-1 (herpes simplex virus 1) infection    Hyperlipidemia    Hypertension    denies HTN, but takes Hyzaar    Kidney stones    Papanicolaou smear of cervix with positive high risk human papilloma virus (HPV) test 2021   Needs cervical cancer screenig in 2022   Stenosing tenosynovitis of wrist 07/2011   right    Past Surgical History:  Procedure Laterality Date   CYST EXCISION     right thumb   DORSAL COMPARTMENT RELEASE  08/26/2011   Procedure: RELEASE DORSAL COMPARTMENT (DEQUERVAIN);  Surgeon: Lamar LULLA Leonor Mickey., MD;  Location: [facility];  Service: Orthopedics;  Laterality: Right;   FOREIGN BODY REMOVAL  08/26/2011   Procedure: FOREIGN BODY REMOVAL ADULT;  Surgeon: Lamar LULLA Leonor Mickey., MD;  Location: [facility];  Service: Orthopedics;  Laterality: Right;   KNEE ARTHROSCOPY Right 06/10/2021    Current Outpatient Medications  Medication Sig Dispense Refill   acetaminophen  (TYLENOL ) 650 MG CR tablet Take 650 mg by mouth every 8 (eight) hours as needed for pain.     Ascorbic Acid (VITAMIN C PO) Take 750 mg by mouth daily.     blood glucose meter kit and supplies KIT Dispense based on patient and insurance preference. Use up to three times daily as directed. (FOR ICD-9 250.00, 250.01). 1 each 0   Blood Pressure Monitoring (BLOOD PRESSURE CUFF) MISC 1 Device by Does not apply route as directed. 1 each 0   CINNAMON PO Take by mouth.     [employer]S SUNSCREEN SPF 30 EX apply  topically to face and body daily for 30     cyanocobalamin  (VITAMIN B12) 1000 MCG tablet Take by mouth.     ELDERBERRY PO Take by mouth.     ferrous sulfate 325 (65 FE) MG EC tablet Take 325 mg by mouth daily.     fluticasone (FLONASE) 50 MCG/ACT nasal spray Place into both nostrils daily.     FOLIC ACID  PO Folic Acid      IBUPROFEN PO Take by mouth as needed. Knee pain     JARDIANCE  25 MG TABS tablet Take 1 tablet (25 mg total) by mouth daily. 90 tablet 3   ketoconazole (NIZORAL) 2 % shampoo as directed Externally once a week; Duration: 30 days     Lancets (ONETOUCH DELICA PLUS LANCET33G) MISC USE AS DIRECTED TO TEST BLOOD SUGAR 1-3 TIMES PER DAY 100 each 2   losartan -hydrochlorothiazide  (HYZAAR) 50-12.5 MG tablet Take 1 tablet by mouth daily. 90 tablet 3   meloxicam (MOBIC) 15 MG tablet Take 15 mg by mouth daily.     mometasone  (ELOCON ) 0.1 % cream Apply 1 application topically daily. (Patient taking differently: Apply 1 application  topically daily. As needed) 45 g 1   Multiple Vitamins-Minerals (ZINC PO) Take by mouth.     mupirocin  ointment (BACTROBAN ) 2 % Apply 1 Application topically 3 (three) times daily. 22 g 0   norethindrone  (MICRONOR ) 0.35 MG tablet TAKE 1 TABLET(0.35 MG) BY MOUTH DAILY 84 tablet 3   Omega-3 Fatty Acids (FISH OIL) 1000 MG CAPS Take by mouth.     omeprazole  (PRILOSEC) 20 MG capsule Take 1 capsule (20 mg total) by mouth daily. 90 capsule 3   pimecrolimus (ELIDEL) 1 % cream Apply topically.     potassium chloride  (KLOR-CON ) 10 MEQ tablet Take 1 tablet (10 mEq total) by mouth daily. 90 tablet 3   pravastatin  (PRAVACHOL ) 20 MG tablet Take 1 tablet (20 mg total) by mouth daily. 90 tablet 3   SALINE NASAL SPRAY NA Place into the nose as needed.     Semaglutide ,0.25 or 0.5MG /DOS, 2 MG/3ML SOPN Inject 0.5 mg into the skin once a week. 3 mL 11   tamsulosin  (FLOMAX ) 0.4 MG CAPS capsule TAKE 1 CAPSULE BY MOUTH DAILY 30 capsule 1   triamcinolone  cream (KENALOG ) 0.1 % Apply  topically daily. 30  g 1   TURMERIC CURCUMIN PO Take by mouth.     valACYclovir  (VALTREX ) 500 MG tablet Take 1 tablet (500 mg total) by mouth daily. 90 tablet 1   zinc gluconate 50 MG tablet Take 50 mg by mouth daily.     No current facility-administered medications for this visit.     ALLERGIES: Codeine, Oak bark [quercus robur], and Soap  Family History  Problem Relation Age of Onset   Hyperlipidemia Mother    Hypertension Mother    Hypertension Father    Stroke Father    Hypertension Sister    Diabetes Sister    Arthritis Maternal Grandmother    Kidney failure Maternal Grandmother    Heart failure Maternal Grandmother    Heart failure Maternal Grandfather    Diabetes Maternal Grandfather    Breast cancer Paternal Grandmother        Age 70s or 21s   Diabetes Mellitus II Paternal Grandmother    Dementia Paternal Grandmother    Throat cancer Paternal Grandmother    Stroke Paternal Grandfather    Cancer Paternal Aunt        bladder or kidney cancer, Age 70/58    Ovarian cancer Cousin 24       maternal uncle's daughter   Colon cancer Other        maternal great-aunt, in her 34s   Ovarian cancer Other        matrenal great-aunt, in her 1s   Esophageal cancer Neg Hx    Stomach cancer Neg Hx    Rectal cancer Neg Hx     Social History   Socioeconomic History   Marital status: Single    Spouse name: Not on file   Number of children: 0   Years of education: Not on file   Highest education level: Not on file  Occupational History   Not on file  Tobacco Use   Smoking status: Never   Smokeless tobacco: Never  Vaping Use   Vaping status: Never Used  Substance and Sexual Activity   Alcohol use: Not Currently   Drug use: No   Sexual activity: Yes    Partners: Male    Birth control/protection: Pill    Comment: Micronor   Other Topics Concern   Not on file  Social History Narrative   Work or School: works with senior center      Home Situation: lives alone       Spiritual Beliefs: none, Christian      Lifestyle: active at work but not otherwise; avoiding carbs            Social Drivers of Corporate investment banker Strain: Not on file  Food Insecurity: Not on file  Transportation Needs: Not on file  Physical Activity: Not on file  Stress: Not on file  Social Connections: Not on file  Intimate Partner Violence: Not on file    Review of Systems  All other systems reviewed and are negative.   PHYSICAL EXAMINATION:   BP 124/82 (BP Location: Left Arm, Patient Position: Sitting)   Pulse 100   LMP 02/20/2024 (Exact Date)   SpO2 98%     General appearance: alert, cooperative and appears stated age   EMB: Consent for procedure. Time out done. Cervix with yellowish brown mucous from the os.  Sterile prep with Hibiclens .  Paracervical block with 1% lidocaine  10 cc, lot number  [id number],  expiration Feb, 2028 Tenaculum to anterior and then posterior cervical lip. Pipelle passed  to    6      cm twice.   Tissue to pathology.  Minimal EBL. No complications.   Chaperone was present for exam:  Kari HERO, CMA  ASSESSMENT:  Irregular menses.  Cramping.  Cervical stenosis.   PLAN:  FU EMB.  We discussed a possible progesterone IUD.  Risks and benefits.  If will consider an IUD, I would recommend US  guided placement.

## 2024-02-29 NOTE — Patient Instructions (Signed)
 Endometrial Biopsy  An endometrial biopsy is a procedure where a tissue sample is removed from the lining of the uterus. This lining is called the endometrium. The tissue sample is then sent to a lab for testing. You may have this type of biopsy to check for: Cancer. Infection. Growths called polyps. Uterine bleeding that can't be explained. Tell a health care provider about: Any allergies you have. All medicines you're taking including vitamins, herbs, eye drops, creams, and over-the-counter medicines. Any problems you or family members have had with anesthesia. Any bleeding problems you have. Any surgeries you have had. Any medical problems you have. Whether you're pregnant or may be pregnant. What are the risks? Your health care provider will talk with you about risks. These may include: Bleeding. Infection. Allergic reactions to medicines. Damage to the wall of the uterus. This is rare. What happens before the procedure? Keep track of your period. You may need to have this biopsy when you're not having your period. Ask your provider about: Changing or stopping your regular medicines. These include any diabetes medicines or blood thinners you take. Taking medicines such as aspirin and ibuprofen. These medicines can thin your blood. Do not take them unless your provider tells you to. Taking over-the-counter medicines, vitamins, herbs, and supplements. Bring a pad with you. You may need to wear one after the biopsy. Plan to have a responsible adult take you home from the hospital or clinic. You won't be allowed to drive. What happens during the procedure? A tool will be put into your vagina to hold it open. This helps your provider see the cervix. The cervix is the lowest part of the uterus. Your cervix will be cleaned with a solution that kills germs. You will be given anesthesia. This keeps you from feeling pain. It will numb your cervix. A tool called forceps will be used to  hold your cervix steady. A thin tool called a uterine sound will be put through your cervix. It will be used to: Find the length of your uterus. Find where to take the sample from. A soft tube called a catheter will be put into your uterus. The catheter will remove a tissue sample. The tube and tools will be removed. The sample will be sent to a lab for testing. The procedure may vary among providers and hospitals. What happens after the procedure? Your blood pressure, heart rate, breathing rate, and blood oxygen level will be monitored until you leave the hospital or clinic. It's up to you to get the results of your procedure. Ask your provider, or the department that is doing the procedure, when your results will be ready. This information is not intended to replace advice given to you by your health care provider. Make sure you discuss any questions you have with your health care provider. Document Revised: 07/27/2022 Document Reviewed: 07/27/2022 Elsevier Patient Education  2024 [employer].

## 2024-03-02 LAB — SURGICAL PATHOLOGY

## 2024-03-04 ENCOUNTER — Ambulatory Visit: Payer: Self-pay | Admitting: Obstetrics and Gynecology

## 2024-03-28 ENCOUNTER — Encounter: Payer: Self-pay | Admitting: Family Medicine

## 2024-03-28 ENCOUNTER — Ambulatory Visit: Admitting: Family Medicine

## 2024-03-28 VITALS — BP 160/94 | HR 90 | Temp 98.4°F | Ht 66.5 in | Wt 209.8 lb

## 2024-03-28 DIAGNOSIS — R110 Nausea: Secondary | ICD-10-CM

## 2024-03-28 DIAGNOSIS — I10 Essential (primary) hypertension: Secondary | ICD-10-CM

## 2024-03-28 DIAGNOSIS — R109 Unspecified abdominal pain: Secondary | ICD-10-CM

## 2024-03-28 DIAGNOSIS — E782 Mixed hyperlipidemia: Secondary | ICD-10-CM

## 2024-03-28 DIAGNOSIS — Z7985 Long-term (current) use of injectable non-insulin antidiabetic drugs: Secondary | ICD-10-CM

## 2024-03-28 DIAGNOSIS — E1169 Type 2 diabetes mellitus with other specified complication: Secondary | ICD-10-CM | POA: Diagnosis not present

## 2024-03-28 DIAGNOSIS — Z7984 Long term (current) use of oral hypoglycemic drugs: Secondary | ICD-10-CM

## 2024-03-28 LAB — POCT GLYCOSYLATED HEMOGLOBIN (HGB A1C): Hemoglobin A1C: 6.2 % — AB (ref 4.0–5.6)

## 2024-03-28 MED ORDER — ONDANSETRON 4 MG PO TBDP: 4.0000 mg | ORAL_TABLET | Freq: Three times a day (TID) | ORAL | 0 refills | Status: DC | PRN

## 2024-03-28 NOTE — Progress Notes (Signed)
 Established Patient Office Visit   Subjective  Patient ID: Linda Conrad, female    DOB: 07/23/1975  Age: 48 y.o. MRN: [id number]  Chief Complaint  Patient presents with   Medical Management of Chronic Issues    Patient came in today for 3 month follow-up for Diabetes and Blood pressure     Pt is a 48 yo female seen for f/u.  Pt did not take bp meds this am due to feeling nauseous.  Doesn't think sensation is related to Ozempic  taken yesterday.  Also having abd cramping as menses just started.  Pt had an endometrial bxp? A few wks ago due to irregular bleeding x 5 wks.  Also had an u/s which showed fibroids and adenomyosis?.  This is the 1st menses sense the procedure.  In th past never had cramping.  Has f/u appt in 2 wks with OB/Gyn .  Taking iron supplement and several other supplements.  Pt working on diet and exercise.  May have had more bread in the last few wks so A1C may be elevated.    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   Anemia 08/07/2017   Hx of abnormal cervical Pap smear 08/01/2015   Type 2 diabetes mellitus with other specified complication (HCC) 09/13/2014   Hypertension associated with diabetes (HCC) 11/16/2013   GERD (gastroesophageal reflux disease) 11/16/2013   Hearing loss - followed by [facility] 11/16/2013   Past Medical History:  Diagnosis Date   Abnormal Pap smear of cervix - hpv per report in 2014, seeing gyn 11/16/2013   normal pap with gyn 2015   Anemia    Arthritis    hand   CIN I (cervical intraepithelial neoplasia I) 2020   Diabetes mellitus without complication (HCC)    Family history of bladder cancer    Family history of breast cancer    Family history of kidney cancer    Family  history of ovarian cancer    Family history of prostate cancer    Foreign body of hand, right 07/2011   right MP   GERD (gastroesophageal reflux disease)    Headache(784.0)    sinus   HSV-1 (herpes simplex virus 1) infection    Hyperlipidemia    Hypertension    denies HTN, but takes Hyzaar    Kidney stones    Papanicolaou smear of cervix with positive high risk human papilloma virus (HPV) test 2021   Needs cervical cancer screenig in 2022   Stenosing tenosynovitis of wrist 07/2011   right   Past Surgical History:  Procedure Laterality Date   CYST EXCISION     right thumb   DORSAL COMPARTMENT RELEASE  08/26/2011   Procedure: RELEASE DORSAL COMPARTMENT (DEQUERVAIN);  Surgeon: Lamar LULLA Leonor Mickey., MD;  [facility];  Service: Orthopedics;  Laterality: Right;   FOREIGN BODY REMOVAL  08/26/2011   Procedure: FOREIGN BODY REMOVAL ADULT;  Surgeon: Lamar LULLA Leonor Mickey., MD;  Location: [facility];  Service: Orthopedics;  Laterality: Right;   KNEE ARTHROSCOPY Right 06/10/2021   Social History   Tobacco Use   Smoking status: Never   Smokeless tobacco: Never  Vaping Use  Vaping status: Never Used  Substance Use Topics   Alcohol use: Not Currently   Drug use: No   Family History  Problem Relation Age of Onset   Hyperlipidemia Mother    Hypertension Mother    Hypertension Father    Stroke Father    Hypertension Sister    Diabetes Sister    Arthritis Maternal Grandmother    Kidney failure Maternal Grandmother    Heart failure Maternal Grandmother    Heart failure Maternal Grandfather    Diabetes Maternal Grandfather    Breast cancer Paternal Grandmother        Age 76s or 26s   Diabetes Mellitus II Paternal Grandmother    Dementia Paternal Grandmother    Throat cancer Paternal Grandmother    Stroke Paternal Grandfather    Cancer Paternal Aunt        bladder or kidney cancer, Age 64/58    Ovarian cancer Cousin 68       maternal uncle's  daughter   Colon cancer Other        maternal great-aunt, in her 9s   Ovarian cancer Other        matrenal great-aunt, in her 2s   Esophageal cancer Neg Hx    Stomach cancer Neg Hx    Rectal cancer Neg Hx    Allergies  Allergen Reactions   Codeine Other (See Comments)    Family hx of allergy   Oak Bark [Quercus Robur]     Oak trees   Soap Rash    Everything except Dove    ROS Negative unless stated above    Objective:     BP (!) 160/94 (BP Location: Left Arm, Patient Position: Sitting, Cuff Size: Large)   Pulse 90   Temp 98.4 F (36.9 C) (Oral)   Ht 5' 6.5 (1.689 m)   Wt 209 lb 12.8 oz (95.2 kg)   LMP 02/20/2024 (Exact Date)   SpO2 99%   BMI 33.36 kg/m  BP Readings from Last 3 Encounters:  03/28/24 (!) 160/94  02/29/24 124/82  02/14/24 (!) 150/92   Wt Readings from Last 3 Encounters:  03/28/24 209 lb 12.8 oz (95.2 kg)  02/14/24 207 lb (93.9 kg)  12/26/23 207 lb 9.6 oz (94.2 kg)      Physical Exam Constitutional:      General: She is not in acute distress.    Appearance: Normal appearance.  HENT:     Head: Normocephalic and atraumatic.     Nose: Nose normal.     Mouth/Throat:     Mouth: Mucous membranes are moist.  Cardiovascular:     Rate and Rhythm: Normal rate and regular rhythm.     Heart sounds: Normal heart sounds. No murmur heard.    No gallop.  Pulmonary:     Effort: Pulmonary effort is normal. No respiratory distress.     Breath sounds: Normal breath sounds. No wheezing, rhonchi or rales.  Skin:    General: Skin is warm and dry.  Neurological:     Mental Status: She is alert and oriented to person, place, and time.        03/28/2024   11:11 AM 12/26/2023   11:57 AM 11/15/2023   11:11 AM  Depression screen PHQ 2/9  Decreased Interest 2 0 0  Down, Depressed, Hopeless 0 0 0  PHQ - 2 Score 2 0 0  Altered sleeping 0 0   Tired, decreased energy 2 1   Change in appetite 1 1  Feeling bad or failure about yourself  0 0   Trouble  concentrating 0 0   Moving slowly or fidgety/restless 0 0   Suicidal thoughts 0 0   PHQ-9 Score 5 2   Difficult doing work/chores  Not difficult at all       03/28/2024   11:11 AM 12/26/2023   11:57 AM 09/26/2023   12:04 PM 06/20/2023   11:49 AM  GAD 7 : Generalized Anxiety Score  Nervous, Anxious, on Edge 0 0 0 0  Control/stop worrying 0 0 0 0  Worry too much - different things 0 0 0 0  Trouble relaxing 1 1 1 1   Restless 1 0 0 0  Easily annoyed or irritable 0 1 1 1   Afraid - awful might happen 0 0 0 0  Total GAD 7 Score 2 2 2 2   Anxiety Difficulty Somewhat difficult Not difficult at all Not difficult at all Not difficult at all     Results for orders placed or performed in visit on 03/28/24  POC HgB A1c  Result Value Ref Range   Hemoglobin A1C 6.2 (A) 4.0 - 5.6 %   HbA1c POC (<> result, manual entry)     HbA1c, POC (prediabetic range)     HbA1c, POC (controlled diabetic range)        Assessment & Plan:   Type 2 diabetes mellitus with other specified complication, without long-term current use of insulin (HCC) -     POCT glycosylated hemoglobin (Hb A1C)  Essential hypertension  Mixed hyperlipidemia  Nausea -     Ondansetron ; Take 1 tablet (4 mg total) by mouth every 8 (eight) hours as needed for nausea or vomiting.  Dispense: 20 tablet; Refill: 0  Abdominal cramping  DM controlled.  Hgb A1C was 5.5% on 09/26/23.  A1C 6.2% this visit.  Continue lifestyle modifications and current meds including ozempic  0.5mg  wkly and jardiance  25 mg daily.  BP uncontrolled this visit due to not taking meds.  Pt advised to take losartan -hydrochlorothiazide  50-12.5 mg when gets home.  Continue monitoring bp.  Given zofran  for nausea likely from start of menses s/p gyn procedure.  Tylenol  for cramping.  F/u with OB/Gyn.  Continue pravastatin  20 mg daily.  Return in about 4 months (around 07/28/2024) for blood pressure, diabetes.   Clotilda JONELLE Single, MD

## 2024-03-31 ENCOUNTER — Encounter (HOSPITAL_BASED_OUTPATIENT_CLINIC_OR_DEPARTMENT_OTHER): Payer: Self-pay | Admitting: Emergency Medicine

## 2024-03-31 ENCOUNTER — Ambulatory Visit
Admission: RE | Admit: 2024-03-31 | Discharge: 2024-03-31 | Disposition: A | Discharge status: Other Acute Inpatient Hospital | Source: Ambulatory Visit | Attending: Family Medicine | Admitting: Family Medicine

## 2024-03-31 ENCOUNTER — Emergency Department (HOSPITAL_BASED_OUTPATIENT_CLINIC_OR_DEPARTMENT_OTHER)
Admission: EM | Admit: 2024-03-31 | Discharge: 2024-03-31 | Disposition: A | Discharge status: Home / Self Care | Attending: Emergency Medicine | Admitting: Emergency Medicine

## 2024-03-31 ENCOUNTER — Emergency Department (HOSPITAL_BASED_OUTPATIENT_CLINIC_OR_DEPARTMENT_OTHER)

## 2024-03-31 ENCOUNTER — Other Ambulatory Visit: Payer: Self-pay

## 2024-03-31 VITALS — BP 137/82 | HR 94 | Temp 98.2°F | Resp 18

## 2024-03-31 DIAGNOSIS — K5732 Diverticulitis of large intestine without perforation or abscess without bleeding: Secondary | ICD-10-CM | POA: Diagnosis not present

## 2024-03-31 DIAGNOSIS — K529 Noninfective gastroenteritis and colitis, unspecified: Secondary | ICD-10-CM | POA: Diagnosis not present

## 2024-03-31 DIAGNOSIS — R1084 Generalized abdominal pain: Secondary | ICD-10-CM | POA: Diagnosis not present

## 2024-03-31 DIAGNOSIS — K920 Hematemesis: Secondary | ICD-10-CM | POA: Diagnosis not present

## 2024-03-31 DIAGNOSIS — K921 Melena: Secondary | ICD-10-CM | POA: Diagnosis not present

## 2024-03-31 DIAGNOSIS — Z7984 Long term (current) use of oral hypoglycemic drugs: Secondary | ICD-10-CM | POA: Insufficient documentation

## 2024-03-31 DIAGNOSIS — E876 Hypokalemia: Secondary | ICD-10-CM

## 2024-03-31 DIAGNOSIS — K5790 Diverticulosis of intestine, part unspecified, without perforation or abscess without bleeding: Secondary | ICD-10-CM

## 2024-03-31 DIAGNOSIS — R109 Unspecified abdominal pain: Secondary | ICD-10-CM | POA: Diagnosis present

## 2024-03-31 DIAGNOSIS — I10 Essential (primary) hypertension: Secondary | ICD-10-CM | POA: Insufficient documentation

## 2024-03-31 DIAGNOSIS — Z794 Long term (current) use of insulin: Secondary | ICD-10-CM | POA: Insufficient documentation

## 2024-03-31 DIAGNOSIS — D259 Leiomyoma of uterus, unspecified: Secondary | ICD-10-CM

## 2024-03-31 DIAGNOSIS — E119 Type 2 diabetes mellitus without complications: Secondary | ICD-10-CM | POA: Diagnosis not present

## 2024-03-31 DIAGNOSIS — Z79899 Other long term (current) drug therapy: Secondary | ICD-10-CM | POA: Diagnosis not present

## 2024-03-31 DIAGNOSIS — N200 Calculus of kidney: Secondary | ICD-10-CM

## 2024-03-31 LAB — COMPREHENSIVE METABOLIC PANEL WITH GFR
ALT: 9 U/L (ref 0–44)
AST: 14 U/L — ABNORMAL LOW (ref 15–41)
Albumin: 4.2 g/dL (ref 3.5–5.0)
Alkaline Phosphatase: 109 U/L (ref 38–126)
Anion gap: 10 (ref 5–15)
BUN: 11 mg/dL (ref 6–20)
CO2: 25 mmol/L (ref 22–32)
Calcium: 9.1 mg/dL (ref 8.9–10.3)
Chloride: 104 mmol/L (ref 98–111)
Creatinine, Ser: 0.83 mg/dL (ref 0.44–1.00)
GFR, Estimated: >60 mL/min (ref >60)
Glucose, Bld: 105 mg/dL — ABNORMAL HIGH (ref 70–99)
Potassium: 2.9 mmol/L — ABNORMAL LOW (ref 3.5–5.1)
Sodium: 139 mmol/L (ref 135–145)
Total Bilirubin: 0.7 mg/dL (ref 0.0–1.2)
Total Protein: 6.6 g/dL (ref 6.5–8.1)

## 2024-03-31 LAB — CBC WITH DIFFERENTIAL/PLATELET
Abs Immature Granulocytes: 0.02 K/uL (ref 0.00–0.07)
Basophils Absolute: 0 K/uL (ref 0.0–0.1)
Basophils Relative: 0 %
Eosinophils Absolute: 0.2 K/uL (ref 0.0–0.5)
Eosinophils Relative: 3 %
HCT: 41.5 % (ref 36.0–46.0)
Hemoglobin: 13.8 g/dL (ref 12.0–15.0)
Immature Granulocytes: 0 %
Lymphocytes Relative: 30 %
Lymphs Abs: 1.4 K/uL (ref 0.7–4.0)
MCH: 28.5 pg (ref 26.0–34.0)
MCHC: 33.3 g/dL (ref 30.0–36.0)
MCV: 85.7 fL (ref 80.0–100.0)
Monocytes Absolute: 0.5 K/uL (ref 0.1–1.0)
Monocytes Relative: 11 %
Neutro Abs: 2.6 K/uL (ref 1.7–7.7)
Neutrophils Relative %: 56 %
Platelets: 283 K/uL (ref 150–400)
RBC: 4.84 MIL/uL (ref 3.87–5.11)
RDW: 13.7 % (ref 11.5–15.5)
WBC: 4.7 K/uL (ref 4.0–10.5)
nRBC: 0 % (ref 0.0–0.2)

## 2024-03-31 LAB — LIPASE, BLOOD: Lipase: 24 U/L (ref 11–51)

## 2024-03-31 LAB — HCG, SERUM, QUALITATIVE: Preg, Serum: NEGATIVE

## 2024-03-31 MED ORDER — POTASSIUM CHLORIDE 10 MEQ/100ML IV SOLN
10.0000 meq | INTRAVENOUS | Status: AC
  Administered 2024-03-31: 10 meq via INTRAVENOUS
  Filled 2024-03-31: qty 100

## 2024-03-31 MED ORDER — SODIUM CHLORIDE 0.9 % IV BOLUS: 1000.0000 mL | Freq: Once | INTRAVENOUS | Status: DC

## 2024-03-31 MED ORDER — POTASSIUM CHLORIDE CRYS ER 20 MEQ PO TBCR
40.0000 meq | EXTENDED_RELEASE_TABLET | Freq: Once | ORAL | Status: AC
  Administered 2024-03-31: 40 meq via ORAL
  Filled 2024-03-31: qty 2

## 2024-03-31 MED ORDER — ONDANSETRON HCL 4 MG/2ML IJ SOLN: 4.0000 mg | Freq: Three times a day (TID) | INTRAMUSCULAR | Status: DC | PRN

## 2024-03-31 MED ORDER — IOHEXOL 300 MG/ML  SOLN
100.0000 mL | Freq: Once | INTRAMUSCULAR | Status: AC | PRN
  Administered 2024-03-31: 100 mL via INTRAVENOUS

## 2024-03-31 MED ORDER — ONDANSETRON 4 MG PO TBDP: 4.0000 mg | ORAL_TABLET | Freq: Three times a day (TID) | ORAL | 0 refills | Status: AC | PRN

## 2024-03-31 NOTE — ED Provider Notes (Signed)
  Physical Exam  BP (!) 155/84   Pulse 90   Temp 98 F (36.7 C)   Resp 16   LMP 03/25/2024   SpO2 100%   Physical Exam  Procedures  Procedures  ED Course / MDM    Medical Decision Making Amount and/or Complexity of Data Reviewed Labs: ordered. Radiology: ordered.  Risk Prescription drug management.   59F, presenting with n/v/d, waiting on CT Abd Pel, K mildly low. Hgb normal. Waiting on fluids and K replenishment, PO challenge and CT imaging.    Labs: Hypokalemia to 2.9, replenished orally and IV.  hCG negative, CBC without a leukocytosis or anemia, CMP without evidence of AKI, normal liver function, lipase normal.  CT abdomen pelvis: IMPRESSION:  1. Punctate 2 mm nonobstructing left renal calculus.  2. Scattered distal colonic diverticulosis without diverticulitis.  3. Stable fibroid uterus.   Patient tolerated p.o., potassium replenished IV and orally, overall symptomatically improved, well-hydrated, stable for discharge and outpatient management.  Zofran  prescribed, advised continued oral rehydration outpatient for likely gastroenteritis.  Informed of incidental finding seen on CT.   Jerrol Agent, MD 03/31/24 1750

## 2024-03-31 NOTE — ED Triage Notes (Signed)
 Pt states ate at 1800 on Monday at a restaurant and ate stuffed chicken and mashed potatoes then at 0200 the next morning she woke up with diarrhea, gassy, generalized abdominal pain. Pt states on Tuesday her bowels were dark charcoal color, but since then the BM brown BM and did see blood in BM on Saturday. Pt states on Wednesday she vomited blood. Pt states still having abdominal. Pt states did have weakness and dizziness, but it has resolved.

## 2024-03-31 NOTE — ED Provider Notes (Signed)
 [facility]PARTMENT AT [facility] Provider Note   CSN: [id number] Arrival date & time: 03/31/24  1317     Patient presents with: Abdominal Pain   Linda Conrad is a 48 y.o. female.    Abdominal Pain Patient presents abdominal pain.  Potential blood in stool blood in emesis.  On Monday ate some food.  On Tuesday began to have diarrhea and abdominal pain.  Reported watery and was black initially.  Later became more clear and at times was red.  Also had an episode of vomiting with some red emesis after drinking water.  Has upper abdominal pain.  Not on blood thinners.  No known sick contacts.    Past Medical History:  Diagnosis Date   Abnormal Pap smear of cervix - hpv per report in 2014, seeing gyn 11/16/2013   normal pap with gyn 2015   Anemia    Arthritis    hand   CIN I (cervical intraepithelial neoplasia I) 2020   Diabetes mellitus without complication (HCC)    Family history of bladder cancer    Family history of breast cancer    Family history of kidney cancer    Family history of ovarian cancer    Family history of prostate cancer    Foreign body of hand, right 07/2011   right MP   GERD (gastroesophageal reflux disease)    Headache(784.0)    sinus   HSV-1 (herpes simplex virus 1) infection    Hyperlipidemia    Hypertension    denies HTN, but takes Hyzaar    Kidney stones    Papanicolaou smear of cervix with positive high risk human papilloma virus (HPV) test 2021   Needs cervical cancer screenig in 2022   Stenosing tenosynovitis of wrist 07/2011   right    Prior to Admission medications   Medication Sig Start Date End Date Taking? Authorizing Provider  acetaminophen  (TYLENOL ) 650 MG CR tablet Take 650 mg by mouth every 8 (eight) hours as needed for pain.    [provider]  Ascorbic Acid (VITAMIN C PO) Take 750 mg by mouth daily.    [provider]  blood glucose meter kit and supplies KIT Dispense based on patient  and insurance preference. Use up to three times daily as directed. (FOR ICD-9 250.00, 250.01). 07/09/19   Koberlein, Junell C, MD  Blood Pressure Monitoring (BLOOD PRESSURE CUFF) MISC 1 Device by Does not apply route as directed. 09/26/23   Mercer Clotilda SAUNDERS, MD  CINNAMON PO Take by mouth.    [provider]  [employer] SUNSCREEN SPF 30 EX apply topically to face and body daily for 30    [provider]  cyanocobalamin  (VITAMIN B12) 1000 MCG tablet Take by mouth. 10/29/15   [provider]  ELDERBERRY PO Take by mouth.    [provider]  ferrous sulfate 325 (65 FE) MG EC tablet Take 325 mg by mouth daily.    [provider]  fluticasone (FLONASE) 50 MCG/ACT nasal spray Place into both nostrils daily.    [provider]  FOLIC ACID  PO Folic Acid     [provider]  IBUPROFEN PO Take by mouth as needed. Knee pain    [provider]  JARDIANCE  25 MG TABS tablet Take 1 tablet (25 mg total) by mouth daily. 12/26/23   Mercer Clotilda SAUNDERS, MD  Lancets (ONETOUCH DELICA PLUS LANCET33G) MISC USE AS DIRECTED TO TEST BLOOD SUGAR 1-3 TIMES PER DAY 03/21/20  Koberlein, Junell C, MD  losartan -hydrochlorothiazide  (HYZAAR) 50-12.5 MG tablet Take 1 tablet by mouth daily. 12/26/23   Mercer Clotilda SAUNDERS, MD  meloxicam (MOBIC) 15 MG tablet Take 15 mg by mouth daily. 12/15/23   [provider]  mometasone  (ELOCON ) 0.1 % cream Apply 1 application topically daily. Patient taking differently: Apply 1 application  topically daily. As needed 04/03/15   Luke Chiquita SAUNDERS, DO  Multiple Vitamins-Minerals (ZINC PO) Take by mouth.    [provider]  mupirocin  ointment (BACTROBAN ) 2 % Apply 1 Application topically 3 (three) times daily. 02/14/24   Johnny Garnette LABOR, MD  norethindrone  (MICRONOR ) 0.35 MG tablet TAKE 1 TABLET(0.35 MG) BY MOUTH DAILY 11/15/23   Amundson C Silva, Brook E, MD  Omega-3 Fatty Acids (FISH OIL) 1000 MG CAPS Take by mouth. 07/30/19   [provider]  omeprazole  (PRILOSEC) 20 MG capsule Take 1 capsule (20 mg total) by mouth daily. 12/26/23   Mercer Clotilda SAUNDERS, MD  ondansetron  (ZOFRAN -ODT) 4 MG disintegrating tablet Take 1 tablet (4 mg total) by mouth every 8 (eight) hours as needed for nausea or vomiting. 03/28/24   Mercer Clotilda SAUNDERS, MD  pimecrolimus (ELIDEL) 1 % cream Apply topically. 07/28/22   [provider]  potassium chloride  (KLOR-CON ) 10 MEQ tablet Take 1 tablet (10 mEq total) by mouth daily. 12/26/23   Mercer Clotilda SAUNDERS, MD  pravastatin  (PRAVACHOL ) 20 MG tablet Take 1 tablet (20 mg total) by mouth daily. 12/26/23   Mercer Clotilda SAUNDERS, MD  SALINE NASAL SPRAY NA Place into the nose as needed.    [provider]  Semaglutide ,0.25 or 0.5MG /DOS, 2 MG/3ML SOPN Inject 0.5 mg into the skin once a week. 12/26/23   Mercer Clotilda SAUNDERS, MD  tamsulosin  (FLOMAX ) 0.4 MG CAPS capsule TAKE 1 CAPSULE BY MOUTH DAILY 10/04/22   Mercer Clotilda SAUNDERS, MD  triamcinolone  cream (KENALOG ) 0.1 % Apply topically daily. 05/30/18   Luke Chiquita SAUNDERS, DO  TURMERIC CURCUMIN PO Take by mouth.    [provider]  valACYclovir  (VALTREX ) 500 MG tablet Take 1 tablet (500 mg total) by mouth daily. 06/20/23   Mercer Clotilda SAUNDERS, MD  zinc gluconate 50 MG tablet Take 50 mg by mouth daily.    [provider]    Allergies: Codeine, Oak bark [quercus robur], and Soap    Review of Systems  Gastrointestinal:  Positive for abdominal pain.    Updated Vital Signs BP (!) 161/77   Pulse 88   Temp 98 F (36.7 C)   Resp 16   LMP 03/25/2024   SpO2 99%   Physical Exam Vitals and nursing note reviewed.  Cardiovascular:     Rate and Rhythm: Normal rate.  Abdominal:     Comments: Upper abdominal tenderness no rebound or guarding.  No hernia palpated.  Neurological:     Mental Status: She is alert.     (all labs ordered are listed, but only abnormal results are displayed) Labs Reviewed  CBC WITH DIFFERENTIAL/PLATELET  COMPREHENSIVE METABOLIC  PANEL WITH GFR  LIPASE, BLOOD  HCG, SERUM, QUALITATIVE    EKG: None  Radiology: No results found.   Procedures   Medications Ordered in the ED - No data to display                                  Medical Decision Making Amount and/or Complexity of Data Reviewed Labs: ordered. Radiology: ordered.  Patient abdominal pain.  Has had some dark stool but states it was watery.  So has had some vomiting.  Potentially redness on both emesis and diarrhea.  Could be GI bleed.  However hemoglobin is normal.  Normal white count.  With tenderness will get CT scan.  Care turned over to oncoming provider, Dr. Jerrol     Final diagnoses:  None    ED Discharge Orders     None          Patsey Lot, MD 03/31/24 1437

## 2024-03-31 NOTE — Discharge Instructions (Addendum)
 Your CT imaging showed incidental findings of a kidney stone in your kidney which should not be causing any pain, diverticulosis without evidence of diverticulitis and uterine fibroids.  Your symptoms are consistent with gastroenteritis.  Take Zofran  for nausea as needed and can continue to orally rehydrate outpatient with electrolyte containing fluids like liquid IV, Pedialyte, sugar-free Gatorade.

## 2024-03-31 NOTE — Discharge Instructions (Addendum)
 Please go to the emergency room for further evaluation of your symptoms

## 2024-03-31 NOTE — ED Notes (Signed)
 Patient is being discharged from the Urgent Care and sent to the Emergency Department via POV . Per Myla Bold, NP, patient is in need of higher level of care due to needing further testing and further work up. Patient is aware and verbalizes understanding of plan of care.  Vitals:   03/31/24 1248  BP: 137/82  Pulse: 94  Resp: 18  Temp: 98.2 F (36.8 C)  SpO2: 98%

## 2024-03-31 NOTE — ED Provider Notes (Signed)
 [facility]    CSN: [id number] Arrival date & time: 03/31/24  1231      History   Chief Complaint Chief Complaint  Patient presents with   GI Bleeding    HPI Linda Conrad is a 48 y.o. female with a past medical history of diabetes, arthritis, anemia, GERD, hypertension, hyperlipidemia, kidney stones presents for abdominal pain with vomiting and diarrhea.  Patient reports Monday night she ate some stuff chicken and mashed potatoes and the next morning woke up with diarrhea, bloating/gassy with generalized abdominal pain.  States the next day her stools became dark charcoal in color.  On Wednesday she had an episode of vomiting blood but no additional episodes since that time.  She states yesterday she had a lot of blood in her stool.  She denies any fevers, dysuria, URI symptoms.  Denies abdominal surgeries.  Intermittently takes her acid reflux medication.  She did see her PCP on 10/29 prior to bowel having blood in the stool and was prescribed Zofran  but patient has not taken this.  She taken Tylenol  OTC but denies use of Pepto-Bismol or other OTC treatments.  She reports she is not able to really eat and is barely taking fluids.  No other concerns at this time.  HPI  Past Medical History:  Diagnosis Date   Abnormal Pap smear of cervix - hpv per report in 2014, seeing gyn 11/16/2013   normal pap with gyn 2015   Anemia    Arthritis    hand   CIN I (cervical intraepithelial neoplasia I) 2020   Diabetes mellitus without complication (HCC)    Family history of bladder cancer    Family history of breast cancer    Family history of kidney cancer    Family history of ovarian cancer    Family history of prostate cancer    Foreign body of hand, right 07/2011   right MP   GERD (gastroesophageal reflux disease)    Headache(784.0)    sinus   HSV-1 (herpes simplex virus 1) infection    Hyperlipidemia    Hypertension    denies HTN, but takes Hyzaar    Kidney stones     Papanicolaou smear of cervix with positive high risk human papilloma virus (HPV) test 2021   Needs cervical cancer screenig in 2022   Stenosing tenosynovitis of wrist 07/2011   right    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   Anemia 08/07/2017   Hx of abnormal cervical Pap smear 08/01/2015   Type 2 diabetes mellitus with other specified complication (HCC) 09/13/2014   Hypertension associated with diabetes (HCC) 11/16/2013   GERD (gastroesophageal reflux disease) 11/16/2013   Hearing loss - followed by [facility] 11/16/2013    Past Surgical History:  Procedure Laterality Date   CYST EXCISION     right thumb   DORSAL COMPARTMENT RELEASE  08/26/2011   Procedure: RELEASE DORSAL COMPARTMENT (DEQUERVAIN);  Surgeon: Lamar LULLA Leonor Mickey., MD;  Location: [facility];  Service: Orthopedics;  Laterality: Right;   FOREIGN BODY REMOVAL  08/26/2011   Procedure: FOREIGN BODY REMOVAL ADULT;  Surgeon: Lamar LULLA Leonor Mickey., MD;  Location: [facility];  Service: Orthopedics;  Laterality: Right;   KNEE ARTHROSCOPY Right 06/10/2021    OB History     Gravida  0   Para  0   Term  0   Preterm  0   AB  0   Living  0      SAB  0   IAB  0   Ectopic  0   Multiple  0   Live Births  0            Home Medications    Prior to Admission medications   Medication Sig Start Date End Date Taking? Authorizing Provider  acetaminophen  (TYLENOL ) 650 MG CR tablet Take 650 mg by mouth every 8 (eight) hours as needed for pain.    [provider]  Ascorbic Acid (VITAMIN C PO) Take 750 mg by mouth daily.    [provider]  blood glucose meter kit and supplies KIT Dispense based on patient and  insurance preference. Use up to three times daily as directed. (FOR ICD-9 250.00, 250.01). 07/09/19   Koberlein, Junell C, MD  Blood Pressure Monitoring (BLOOD PRESSURE CUFF) MISC 1 Device by Does not apply route as directed. 09/26/23   Mercer Clotilda SAUNDERS, MD  CINNAMON PO Take by mouth.    [provider]  [employer] SUNSCREEN SPF 30 EX apply topically to face and body daily for 30    [provider]  cyanocobalamin  (VITAMIN B12) 1000 MCG tablet Take by mouth. 10/29/15   [provider]  ELDERBERRY PO Take by mouth.    [provider]  ferrous sulfate 325 (65 FE) MG EC tablet Take 325 mg by mouth daily.    [provider]  fluticasone (FLONASE) 50 MCG/ACT nasal spray Place into both nostrils daily.    [provider]  FOLIC ACID  PO Folic Acid     [provider]  IBUPROFEN PO Take by mouth as needed. Knee pain    [provider]  JARDIANCE  25 MG TABS tablet Take 1 tablet (25 mg total) by mouth daily. 12/26/23   Mercer Clotilda SAUNDERS, MD  Lancets (ONETOUCH DELICA PLUS LANCET33G) MISC USE AS DIRECTED TO TEST BLOOD SUGAR 1-3 TIMES PER DAY 03/21/20   Koberlein, Junell C, MD  losartan -hydrochlorothiazide  (HYZAAR) 50-12.5 MG tablet Take 1 tablet by mouth daily. 12/26/23   Mercer Clotilda SAUNDERS, MD  meloxicam (MOBIC) 15 MG tablet Take 15 mg by mouth daily. 12/15/23   [provider]  mometasone  (ELOCON ) 0.1 % cream Apply 1 application topically daily. Patient taking differently: Apply 1 application  topically daily. As needed 04/03/15   Luke Chiquita SAUNDERS, DO  Multiple Vitamins-Minerals (ZINC PO) Take by mouth.    [provider]  mupirocin  ointment (BACTROBAN ) 2 % Apply 1 Application topically 3 (three) times daily. 02/14/24   Johnny Garnette LABOR, MD  norethindrone  (MICRONOR ) 0.35 MG tablet TAKE 1 TABLET(0.35 MG) BY MOUTH DAILY 11/15/23   Amundson C Silva, Brook E, MD  Omega-3 Fatty Acids (FISH OIL) 1000 MG CAPS Take by mouth. 07/30/19   [provider]  omeprazole  (PRILOSEC) 20 MG capsule Take 1 capsule (20 mg total) by mouth daily. 12/26/23   Mercer Clotilda SAUNDERS, MD  ondansetron  (ZOFRAN -ODT) 4 MG disintegrating tablet Take 1 tablet (4 mg total) by mouth every 8 (eight) hours as needed for nausea or vomiting. 03/28/24   Mercer Clotilda SAUNDERS, MD  pimecrolimus (ELIDEL) 1 % cream Apply topically. 07/28/22   [provider]  potassium  chloride (KLOR-CON ) 10 MEQ tablet Take 1 tablet (10 mEq total) by mouth daily. 12/26/23   Mercer Clotilda SAUNDERS, MD  pravastatin  (PRAVACHOL ) 20 MG tablet Take 1 tablet (20 mg total) by mouth daily. 12/26/23   Mercer Clotilda SAUNDERS, MD  SALINE NASAL SPRAY NA Place into the nose as needed.    [provider]  Semaglutide ,0.25 or 0.5MG /DOS, 2 MG/3ML SOPN Inject 0.5 mg into the skin once a week. 12/26/23   Mercer Clotilda SAUNDERS, MD  tamsulosin  (FLOMAX ) 0.4 MG CAPS capsule TAKE 1 CAPSULE BY MOUTH DAILY 10/04/22   Mercer Clotilda SAUNDERS, MD  triamcinolone  cream (KENALOG ) 0.1 % Apply topically daily. 05/30/18   Luke Chiquita SAUNDERS, DO  TURMERIC CURCUMIN PO Take by mouth.    [provider]  valACYclovir  (VALTREX ) 500 MG tablet Take 1 tablet (500 mg total) by mouth daily. 06/20/23   Mercer Clotilda SAUNDERS, MD  zinc gluconate 50 MG tablet Take 50 mg by mouth daily.    [provider]    Family History Family History  Problem Relation Age of Onset   Hyperlipidemia Mother    Hypertension Mother    Hypertension Father    Stroke Father    Hypertension Sister    Diabetes Sister    Arthritis Maternal Grandmother    Kidney failure Maternal Grandmother    Heart failure Maternal Grandmother    Heart failure Maternal Grandfather    Diabetes Maternal Grandfather    Breast cancer Paternal Grandmother        Age 27s or 69s   Diabetes Mellitus II Paternal Grandmother    Dementia Paternal Grandmother    Throat cancer Paternal Grandmother    Stroke Paternal Grandfather    Cancer Paternal Aunt        bladder or kidney  cancer, Age 30/58    Ovarian cancer Cousin 57       maternal uncle's daughter   Colon cancer Other        maternal great-aunt, in her 48s   Ovarian cancer Other        matrenal great-aunt, in her 59s   Esophageal cancer Neg Hx    Stomach cancer Neg Hx    Rectal cancer Neg Hx     Social History Social History   Tobacco Use   Smoking status: Never   Smokeless tobacco: Never  Vaping Use   Vaping status: Never Used  Substance Use Topics   Alcohol use: Not Currently   Drug use: No     Allergies   Codeine, Oak bark [quercus robur], and Soap   Review of Systems Review of Systems  Gastrointestinal:  Positive for abdominal pain, blood in stool, diarrhea, nausea and vomiting.     Physical Exam Triage Vital Signs ED Triage Vitals  Encounter Vitals Group     BP 03/31/24 1248 137/82     Girls Systolic BP Percentile --      Girls Diastolic BP Percentile --      Boys Systolic BP Percentile --      Boys Diastolic BP Percentile --      Pulse Rate 03/31/24 1248 94     Resp 03/31/24 1248 18     Temp 03/31/24 1248 98.2 F (36.8 C)     Temp Source 03/31/24 1248 Oral     SpO2 03/31/24 1248 98 %     Weight --      Height --      Head Circumference --      Peak  Flow --      Pain Score 03/31/24 1246 6     Pain Loc --      Pain Education --      Exclude from Growth Chart --    No data found.  Updated Vital Signs BP 137/82   Pulse 94   Temp 98.2 F (36.8 C) (Oral)   Resp 18   LMP 03/25/2024   SpO2 98%   Visual Acuity Right Eye Distance:   Left Eye Distance:   Bilateral Distance:    Right Eye Near:   Left Eye Near:    Bilateral Near:     Physical Exam Vitals and nursing note reviewed.  Constitutional:      General: She is not in acute distress.    Appearance: Normal appearance. She is not ill-appearing.  HENT:     Head: Normocephalic and atraumatic.  Eyes:     Pupils: Pupils are equal, round, and reactive to light.  Cardiovascular:     Rate and Rhythm:  Normal rate.  Pulmonary:     Effort: Pulmonary effort is normal.  Abdominal:     General: Bowel sounds are increased.     Palpations: There is no splenomegaly.     Tenderness: There is generalized abdominal tenderness. Negative signs include Rovsing's sign and McBurney's sign.     Comments: Patient began to cry with palpation to right upper quadrant  Skin:    General: Skin is warm and dry.  Neurological:     General: No focal deficit present.     Mental Status: She is alert and oriented to person, place, and time.  Psychiatric:        Mood and Affect: Mood normal.        Behavior: Behavior normal.      UC Treatments / Results  Labs (all labs ordered are listed, but only abnormal results are displayed) Labs Reviewed - No data to display  EKG   Radiology No results found.  Procedures Procedures (including critical care time)  Medications Ordered in UC Medications - No data to display  Initial Impression / Assessment and Plan / UC Course  I have reviewed the triage vital signs and the nursing notes.  Pertinent labs & imaging results that were available during my care of the patient were reviewed by me and considered in my medical decision making (see chart for details).     Reviewed exam and symptoms with patient.  Patient reporting 6 days of vomiting with diarrhea with an episode of hematic emesis as well as blood in stool.  Patient with significant tenderness to the right upper quadrant on palpation.  Given the symptoms I advise she go to the emergency room for further evaluation.  She is in agreement with plan and will go POV to the emergency room. Final Clinical Impressions(s) / UC Diagnoses   Final diagnoses:  Generalized abdominal pain  Hematemesis with nausea  Blood in stool     Discharge Instructions      Please go to the emergency room for further evaluation of your symptoms    ED Prescriptions   None    PDMP not reviewed this encounter.    Loreda Myla SAUNDERS, NP 03/31/24 [phone]

## 2024-03-31 NOTE — ED Triage Notes (Signed)
 Abdo pain, vomiting and  my bowels are off Reports char like stool Seen at [facility] sent for eval Unable to take meds today

## 2024-04-04 ENCOUNTER — Ambulatory Visit

## 2024-04-16 ENCOUNTER — Encounter: Payer: Self-pay | Admitting: Family Medicine

## 2024-04-16 ENCOUNTER — Ambulatory Visit: Admitting: Family Medicine

## 2024-04-16 VITALS — BP 138/90 | HR 95 | Temp 97.9°F | Ht 66.5 in | Wt 207.2 lb

## 2024-04-16 DIAGNOSIS — K5790 Diverticulosis of intestine, part unspecified, without perforation or abscess without bleeding: Secondary | ICD-10-CM

## 2024-04-16 DIAGNOSIS — R143 Flatulence: Secondary | ICD-10-CM

## 2024-04-16 DIAGNOSIS — K921 Melena: Secondary | ICD-10-CM

## 2024-04-16 DIAGNOSIS — R109 Unspecified abdominal pain: Secondary | ICD-10-CM

## 2024-04-16 DIAGNOSIS — E1169 Type 2 diabetes mellitus with other specified complication: Secondary | ICD-10-CM

## 2024-04-16 DIAGNOSIS — Z23 Encounter for immunization: Secondary | ICD-10-CM

## 2024-04-16 DIAGNOSIS — I10 Essential (primary) hypertension: Secondary | ICD-10-CM

## 2024-04-16 NOTE — Progress Notes (Signed)
 Established Patient Office Visit   Subjective  Patient ID: Linda Conrad, female    DOB: 10/09/1975  Age: 48 y.o. MRN: [id number]  Chief Complaint  Patient presents with   Acute Visit    Patient came in today for stomach pain     Pt is a 48 yo female seen for ED f/u.  Pt seen in ED on 03/31/24 for increased abd pain, cramping, emesis, diarrhea, and blood in stools-though on menses.  Sx of gas and emesis started after eating chicken, mashed potatoes, and spinach at a restaurant.  Emesis was reddish/pink.  In ED CT scan revealed small kidney stones, fibroids, and diverticulosis. She recalls a colonoscopy in 2022 but is unsure if diverticulosis was noted at that time.  Vomiting persisted for two to three days, was unable to eat or drink.  Abdominal pain is localized around the navel, starting as a cramp and turning into a burning sensation.  She has been holding off on taking Ozempic  for two weeks, suspecting it might be related to her symptoms. During the week of her symptoms, she did not take any medications, including Ozempic , due to her inability to keep anything down. She has been prescribed Zofran  for nausea but has not used it yet.  Eating boiled eggs, peanut butter crackers, and goulash, and trying to increase fluids. She has reduced her intake of supplements, continuing only with B12 and iron at the last visit.   She reports increased urinary frequency but no burning sensation during urination. Also notes recent hemorrhoids. Denies back pain or dizziness.      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   Anemia 08/07/2017   Hx of abnormal cervical Pap smear 08/01/2015   Type 2 diabetes mellitus with other specified complication (HCC)  09/13/2014   Hypertension associated with diabetes (HCC) 11/16/2013   GERD (gastroesophageal reflux disease) 11/16/2013   Hearing loss - followed by [facility] 11/16/2013   Past Medical History:  Diagnosis Date   Abnormal Pap smear of cervix - hpv per report in 2014, seeing gyn 11/16/2013   normal pap with gyn 2015   Anemia    Arthritis    hand   CIN I (cervical intraepithelial neoplasia I) 2020   Diabetes mellitus without complication (HCC)    Family history of bladder cancer    Family history of breast cancer    Family history of kidney cancer    Family history of ovarian cancer    Family history of prostate cancer    Foreign body of hand, right 07/2011   right MP   GERD (gastroesophageal reflux disease)    Headache(784.0)    sinus   HSV-1 (herpes simplex virus 1) infection    Hyperlipidemia    Hypertension    denies HTN, but takes Hyzaar    Kidney stones    Papanicolaou smear of cervix with positive high risk human papilloma virus (HPV) test 2021   Needs cervical cancer screenig in 2022   Stenosing tenosynovitis of wrist 07/2011   right   Past Surgical History:  Procedure Laterality Date   CYST EXCISION     right thumb   DORSAL COMPARTMENT RELEASE  08/26/2011  Procedure: RELEASE DORSAL COMPARTMENT (DEQUERVAIN);  Surgeon: Lamar LULLA Leonor Mickey., MD;  Location: [facility];  Service: Orthopedics;  Laterality: Right;   FOREIGN BODY REMOVAL  08/26/2011   Procedure: FOREIGN BODY REMOVAL ADULT;  Surgeon: Lamar LULLA Leonor Mickey., MD;  Location: [facility];  Service: Orthopedics;  Laterality: Right;   KNEE ARTHROSCOPY Right 06/10/2021   Social History   Tobacco Use   Smoking status: Never   Smokeless tobacco: Never  Vaping Use   Vaping status: Never Used  Substance Use Topics   Alcohol use: Not Currently   Drug use: No   Family History  Problem Relation Age of Onset   Hyperlipidemia Mother    Hypertension Mother    Hypertension Father     Stroke Father    Hypertension Sister    Diabetes Sister    Arthritis Maternal Grandmother    Kidney failure Maternal Grandmother    Heart failure Maternal Grandmother    Heart failure Maternal Grandfather    Diabetes Maternal Grandfather    Breast cancer Paternal Grandmother        Age 28s or 52s   Diabetes Mellitus II Paternal Grandmother    Dementia Paternal Grandmother    Throat cancer Paternal Grandmother    Stroke Paternal Grandfather    Cancer Paternal Aunt        bladder or kidney cancer, Age 43/58    Ovarian cancer Cousin 91       maternal uncle's daughter   Colon cancer Other        maternal great-aunt, in her 26s   Ovarian cancer Other        matrenal great-aunt, in her 56s   Esophageal cancer Neg Hx    Stomach cancer Neg Hx    Rectal cancer Neg Hx    Allergies  Allergen Reactions   Codeine Other (See Comments)    Family hx of allergy   Oak Bark [Quercus Robur]     Oak trees   Soap Rash    Everything except Dove    ROS Negative unless stated above    Objective:     BP (!) 138/90 (BP Location: Left Arm, Patient Position: Sitting, Cuff Size: Large)   Pulse 95   Temp 97.9 F (36.6 C) (Oral)   Ht 5' 6.5 (1.689 m)   Wt 207 lb 3.2 oz (94 kg)   LMP 03/25/2024   SpO2 100%   BMI 32.94 kg/m  BP Readings from Last 3 Encounters:  03/31/24 (!) 147/74  03/31/24 137/82  03/28/24 (!) 160/94   Wt Readings from Last 3 Encounters:  03/28/24 209 lb 12.8 oz (95.2 kg)  02/14/24 207 lb (93.9 kg)  12/26/23 207 lb 9.6 oz (94.2 kg)      Physical Exam Constitutional:      General: She is not in acute distress.    Appearance: Normal appearance.  HENT:     Head: Normocephalic and atraumatic.     Nose: Nose normal.     Mouth/Throat:     Mouth: Mucous membranes are moist.  Eyes:     Conjunctiva/sclera: Conjunctivae normal.  Cardiovascular:     Rate and Rhythm: Normal rate and regular rhythm.     Heart sounds: Normal heart sounds. No murmur heard.    No  gallop.  Pulmonary:     Effort: Pulmonary effort is normal. No respiratory distress.     Breath sounds: Normal breath sounds. No wheezing, rhonchi or rales.  Abdominal:  General: Bowel sounds are normal.     Palpations: Abdomen is soft.     Tenderness: There is abdominal tenderness. There is no right CVA tenderness, left CVA tenderness, guarding or rebound.  Skin:    General: Skin is warm and dry.  Neurological:     Mental Status: She is alert and oriented to person, place, and time. Mental status is at baseline.        03/28/2024   11:11 AM 12/26/2023   11:57 AM 11/15/2023   11:11 AM  Depression screen PHQ 2/9  Decreased Interest 2 0 0  Down, Depressed, Hopeless 0 0 0  PHQ - 2 Score 2 0 0  Altered sleeping 0 0   Tired, decreased energy 2 1   Change in appetite 1 1   Feeling bad or failure about yourself  0 0   Trouble concentrating 0 0   Moving slowly or fidgety/restless 0 0   Suicidal thoughts 0 0   PHQ-9 Score 5  2    Difficult doing work/chores  Not difficult at all      Data saved with a previous flowsheet row definition      03/28/2024   11:11 AM 12/26/2023   11:57 AM 09/26/2023   12:04 PM 06/20/2023   11:49 AM  GAD 7 : Generalized Anxiety Score  Nervous, Anxious, on Edge 0 0 0 0  Control/stop worrying 0 0 0 0  Worry too much - different things 0 0 0 0  Trouble relaxing 1 1 1 1   Restless 1 0 0 0  Easily annoyed or irritable 0 1 1 1   Afraid - awful might happen 0 0 0 0  Total GAD 7 Score 2 2 2 2   Anxiety Difficulty Somewhat difficult Not difficult at all Not difficult at all Not difficult at all     No results found for any visits on 04/16/24.    Assessment & Plan:   Abdominal cramping -     Ambulatory referral to Gastroenterology  Diverticulosis -     Ambulatory referral to Gastroenterology  Flatus -     Ambulatory referral to Gastroenterology  Hematochezia -     Ambulatory referral to Gastroenterology  Type 2 diabetes mellitus with other  specified complication, without long-term current use of insulin (HCC)  Essential hypertension  Need for influenza vaccination -     Flu vaccine trivalent PF, 6mos and older(Flulaval,Afluria,Fluarix,Fluzone)  Acute gastroenteritis resolving.  Sx possibly due to contaminated food vs viral etiology.   Ozempic  may exacerbate symptoms. Hold Ozempic  to assess its contribution. Refer to a gastroenterologist for further evaluation. Advise Zofran  for nausea as needed. Encourage small sips of fluids and a bland diet with smaller, frequent meals.   Discussed incidental finding of diverticulosis on CT.   Reiterated several times the difference between diverticulosis and diverticulitis.  Advised not causing sx.  Referral to GI.  Advised to monitor blood sugar and the importance of diet changes.  BP elevated.  Possibly 2/2 abdominal pain and inability to take medication during episodes of emesis.  Continue monitoring BP at home and keep a log to review to clinic.  Hydration encouraged.  Influenza vaccine given this visit.  No follow-ups on file.   Clotilda JONELLE Single, MD

## 2024-05-17 ENCOUNTER — Other Ambulatory Visit: Payer: Self-pay | Admitting: Family Medicine

## 2024-05-17 DIAGNOSIS — B009 Herpesviral infection, unspecified: Secondary | ICD-10-CM

## 2024-06-07 ENCOUNTER — Encounter: Payer: Self-pay | Admitting: Gastroenterology

## 2024-06-07 ENCOUNTER — Ambulatory Visit: Admitting: Gastroenterology

## 2024-06-07 VITALS — BP 126/80 | HR 88 | Ht 66.5 in | Wt 207.0 lb

## 2024-06-07 DIAGNOSIS — R1013 Epigastric pain: Secondary | ICD-10-CM | POA: Diagnosis not present

## 2024-06-07 DIAGNOSIS — K921 Melena: Secondary | ICD-10-CM | POA: Diagnosis not present

## 2024-06-07 DIAGNOSIS — D509 Iron deficiency anemia, unspecified: Secondary | ICD-10-CM

## 2024-06-07 DIAGNOSIS — R112 Nausea with vomiting, unspecified: Secondary | ICD-10-CM

## 2024-06-07 DIAGNOSIS — K219 Gastro-esophageal reflux disease without esophagitis: Secondary | ICD-10-CM

## 2024-06-07 DIAGNOSIS — K920 Hematemesis: Secondary | ICD-10-CM

## 2024-06-07 DIAGNOSIS — Z862 Personal history of diseases of the blood and blood-forming organs and certain disorders involving the immune mechanism: Secondary | ICD-10-CM

## 2024-06-07 NOTE — Progress Notes (Unsigned)
 "  Linda Conrad [id number] 09-18-1975   Chief Complaint:  Referring Provider: Mercer Clotilda SAUNDERS, MD Primary GI MD: Sampson (previous Dr. Aneita)  HPI: Linda Conrad is a 49 y.o. female with past medical history of anemia, diabetes, GERD, HLD, HTN, kidney stones, diverticulosis who presents today for a complaint of *** .    Seen in office 05/02/2020 by Dr. Aneita for further evaluation of iron deficiency anemia and GERD.  Had heme positive stool previously.  Has been followed by hematology for management of anemia over the previous couple of years.  History of chronic GERD that was well-controlled on omeprazole  at that time.  IDA thought likely related to menses, but was scheduled for colonoscopy and EGD to rule out GI source of blood loss.  Colonoscopy revealed small internal hemorrhoids and otherwise normal with recommended recall in 10 years.  Multiple fundic land polyps seen on EGD and otherwise normal.  Patient is referred by PCP for evaluation of abdominal cramping, diverticulosis, flatus, and hematochezia. Had been recently seen in the ED 03/31/2024 for increased abdominal pain, cramping, emesis, diarrhea, and blood in stools, though was on menses at that time.  Symptoms of gas and emesis had began after eating chicken, mashed potatoes, and spinach at a restaurant.  Emesis was reddish/pink.  CT scan in the ED revealed small kidney stones, fibroids, and diverticulosis.  She has been holding off on taking Ozempic  for a couple weeks suspecting it might be related to her symptoms. Thought to have acute gastroenteritis that was resolving.  Symptoms possibly due to contaminated food versus viral etiology.  Ozempic  may exacerbate symptoms.  Advised to hold Ozempic  to assess contribution.  Referred to GI for further evaluation.  Advised Zofran  as needed for nausea, dietary modifications.   Abd pain resolved Black tarry stool, was taking iron, this resolved Pinkish red vomit  resolved     Discussed the use of AI scribe software for clinical note transcription with the patient, who gave verbal consent to proceed.  History of Present Illness Linda Conrad is a 49 year old female with iron deficiency anemia and gastroesophageal reflux disease who presents for evaluation of recent gastrointestinal symptoms and preprocedural assessment for upper endoscopy.  Iron deficiency anemia - Chronic iron deficiency anemia managed with intermittent oral iron supplementation as previously recommended. - Daily iron supplementation at the time of recent gastrointestinal illness; sometimes discontinued if perceived excessive. - No consistent change in stool color with iron use prior to recent episode. - One episode of black, tarry stool occurred during menstrual period, unclear source of bleeding.  Acute gastrointestinal symptoms - Acute onset of abdominal pain, nausea, vomiting described as pinkish-red, and diarrhea in late November 2025 after restaurant meal. - Significant gastrointestinal distress led to dehydration and electrolyte loss, requiring emergency room evaluation on April 30, 2024. - Transient 'knot' sensation in abdomen when bending over during episode, now resolved. - Acute symptoms have resolved; currently only occasional mild abdominal discomfort. - Bowel movements now regular and well-formed, without constipation or ongoing diarrhea. - No ongoing abdominal pain, difficulty swallowing, chest pain, or shortness of breath. - No current abdominal bulge or cardiopulmonary symptoms.  Gastroesophageal reflux disease (gerd) - Reflux symptoms are infrequent and managed with omeprazole , taken daily or as needed. - Symptoms triggered by dietary factors such as red sauce. - No current dysphagia or severe reflux.  History of benign gastric polyps and internal hemorrhoids - Upper endoscopy and colonoscopy several years ago revealed benign gastric polyps  and small  internal hemorrhoids. - No colon polyps identified on prior colonoscopy. - Prior positive stool testing attributed to hemorrhoids.      Previous GI Procedures/Imaging   CT A/P 03/31/2024 IMPRESSION: 1. Punctate 2 mm nonobstructing left renal calculus. 2. Scattered distal colonic diverticulosis without diverticulitis. 3. Stable fibroid uterus.  Colonoscopy 06/19/2020 - Small internal hemorrhoids.  - The examination was otherwise normal on direct and retroflexion views.  - No specimens collected. - Recall 10 years  EGD 06/19/2020 - Normal esophagus.  - Multiple gastric polyps. Biopsied.  - Normal duodenal bulb and second portion of the duodenum. Biopsied. Path: 1. Surgical [P], duodenal biopsies - DUODENAL MUCOSA WITH NO SPECIFIC HISTOPATHOLOGIC CHANGES - NEGATIVE FOR INCREASED INTRAEPITHELIAL LYMPHOCYTES OR VILLOUS ARCHITECTURAL CHANGES  2. Surgical [P], gastric body and fundus polyps biopsies - FUNDIC GLAND POLYP(S) - NEGATIVE FOR DYSPLASIA  Past Medical History:  Diagnosis Date   Abnormal Pap smear of cervix - hpv per report in 2014, seeing gyn 11/16/2013   normal pap with gyn 2015   Anemia    Arthritis    hand   CIN I (cervical intraepithelial neoplasia I) 2020   Diabetes mellitus without complication (HCC)    Family history of bladder cancer    Family history of breast cancer    Family history of kidney cancer    Family history of ovarian cancer    Family history of prostate cancer    Foreign body of hand, right 07/2011   right MP   GERD (gastroesophageal reflux disease)    Headache(784.0)    sinus   HSV-1 (herpes simplex virus 1) infection    Hyperlipidemia    Hypertension    denies HTN, but takes Hyzaar    Kidney stones    Papanicolaou smear of cervix with positive high risk human papilloma virus (HPV) test 2021   Needs cervical cancer screenig in 2022   Stenosing tenosynovitis of wrist 07/2011   right    Past Surgical History:  Procedure Laterality Date    CYST EXCISION     right thumb   DORSAL COMPARTMENT RELEASE  08/26/2011   Procedure: RELEASE DORSAL COMPARTMENT (DEQUERVAIN);  Surgeon: Lamar LULLA Leonor Mickey., MD;  Location[facility];  Service: Orthopedics;  Laterality: Right;   FOREIGN BODY REMOVAL  08/26/2011   Procedure: FOREIGN BODY REMOVAL ADULT;  Surgeon: Lamar LULLA Leonor Mickey., MD;  Location: [facility];  Service: Orthopedics;  Laterality: Right;   KNEE ARTHROSCOPY Right 06/10/2021    Current Outpatient Medications  Medication Sig Dispense Refill   acetaminophen  (TYLENOL ) 650 MG CR tablet Take 650 mg by mouth every 8 (eight) hours as needed for pain.     Ascorbic Acid (VITAMIN C PO) Take 750 mg by mouth daily.     blood glucose meter kit and supplies KIT Dispense based on patient and insurance preference. Use up to three times daily as directed. (FOR ICD-9 250.00, 250.01). 1 each 0   Blood Pressure Monitoring (BLOOD PRESSURE CUFF) MISC 1 Device by Does not apply route as directed. 1 each 0   CINNAMON PO Take by mouth.     [employer] SUNSCREEN SPF 30 EX apply topically to face and body daily for 30     cyanocobalamin  (VITAMIN B12) 1000 MCG tablet Take by mouth.     ELDERBERRY PO Take by mouth.     ferrous sulfate 325 (65 FE) MG EC tablet Take 325 mg by mouth daily.     fluticasone (  FLONASE) 50 MCG/ACT nasal spray Place into both nostrils daily.     FOLIC ACID  PO Folic Acid      IBUPROFEN PO Take by mouth as needed. Knee pain     JARDIANCE  25 MG TABS tablet Take 1 tablet (25 mg total) by mouth daily. 90 tablet 3   Lancets (ONETOUCH DELICA PLUS LANCET33G) MISC USE AS DIRECTED TO TEST BLOOD SUGAR 1-3 TIMES PER DAY 100 each 2   losartan -hydrochlorothiazide  (HYZAAR) 50-12.5 MG tablet Take 1 tablet by mouth daily. 90 tablet 3   meloxicam (MOBIC) 15 MG tablet Take 15 mg by mouth daily.     mometasone  (ELOCON ) 0.1 % cream Apply 1 application topically daily. (Patient taking differently: Apply 1 application  topically daily.  As needed) 45 g 1   Multiple Vitamins-Minerals (ZINC PO) Take by mouth.     mupirocin  ointment (BACTROBAN ) 2 % Apply 1 Application topically 3 (three) times daily. 22 g 0   norethindrone  (MICRONOR ) 0.35 MG tablet TAKE 1 TABLET(0.35 MG) BY MOUTH DAILY 84 tablet 3   Omega-3 Fatty Acids (FISH OIL) 1000 MG CAPS Take by mouth.     omeprazole  (PRILOSEC) 20 MG capsule Take 1 capsule (20 mg total) by mouth daily. 90 capsule 3   ondansetron  (ZOFRAN -ODT) 4 MG disintegrating tablet Take 1 tablet (4 mg total) by mouth every 8 (eight) hours as needed. 20 tablet 0   pimecrolimus (ELIDEL) 1 % cream Apply topically.     potassium chloride  (KLOR-CON ) 10 MEQ tablet Take 1 tablet (10 mEq total) by mouth daily. 90 tablet 3   pravastatin  (PRAVACHOL ) 20 MG tablet Take 1 tablet (20 mg total) by mouth daily. 90 tablet 3   SALINE NASAL SPRAY NA Place into the nose as needed.     Semaglutide ,0.25 or 0.5MG /DOS, 2 MG/3ML SOPN Inject 0.5 mg into the skin once a week. 3 mL 11   tamsulosin  (FLOMAX ) 0.4 MG CAPS capsule TAKE 1 CAPSULE BY MOUTH DAILY 30 capsule 1   triamcinolone  cream (KENALOG ) 0.1 % Apply topically daily. 30 g 1   TURMERIC CURCUMIN PO Take by mouth.     valACYclovir  (VALTREX ) 500 MG tablet TAKE 1 TABLET BY MOUTH DAILY 30 tablet 1   zinc gluconate 50 MG tablet Take 50 mg by mouth daily.     No current facility-administered medications for this visit.    Allergies as of 06/07/2024 - Review Complete 04/16/2024  Allergen Reaction Noted   Codeine Other (See Comments) 06/19/2020   Oak bark [quercus robur]  11/16/2013   Soap Rash 08/20/2011    Family History  Problem Relation Age of Onset   Hyperlipidemia Mother    Hypertension Mother    Hypertension Father    Stroke Father    Hypertension Sister    Diabetes Sister    Arthritis Maternal Grandmother    Kidney failure Maternal Grandmother    Heart failure Maternal Grandmother    Heart failure Maternal Grandfather    Diabetes Maternal Grandfather     Breast cancer Paternal Grandmother        Age 56s or 90s   Diabetes Mellitus II Paternal Grandmother    Dementia Paternal Grandmother    Throat cancer Paternal Grandmother    Stroke Paternal Grandfather    Cancer Paternal Aunt        bladder or kidney cancer, Age 43/58    Ovarian cancer Cousin 56       maternal uncle's daughter   Colon cancer Other  maternal great-aunt, in her 25s   Ovarian cancer Other        matrenal great-aunt, in her 30s   Esophageal cancer Neg Hx    Stomach cancer Neg Hx    Rectal cancer Neg Hx     Social History[1]   Review of Systems:    Constitutional: No weight loss, fever, chills, weakness or fatigue Eyes: No change in vision Ears, Nose, Throat:  No change in hearing or congestion Skin: No rash or itching Cardiovascular: No chest pain, chest pressure or palpitations   Respiratory: No SOB or cough Gastrointestinal: See HPI and otherwise negative Genitourinary: No dysuria or change in urinary frequency Neurological: No headache, dizziness or syncope Musculoskeletal: No new muscle or joint pain Hematologic: No bleeding or bruising    Physical Exam:  Vital signs: There were no vitals taken for this visit.  Wt Readings from Last 3 Encounters:  04/16/24 207 lb 3.2 oz (94 kg)  03/28/24 209 lb 12.8 oz (95.2 kg)  02/14/24 207 lb (93.9 kg)     Constitutional: NAD, Well developed, Well nourished, alert and cooperative Head:  Normocephalic and atraumatic.  Eyes: No scleral icterus. Conjunctiva pink. Mouth: No oral lesions. Respiratory: Respirations even and unlabored. Lungs clear to auscultation bilaterally.  No wheezes, crackles, or rhonchi.  Cardiovascular:  Regular rate and rhythm. No murmurs. No peripheral edema. Gastrointestinal:  Soft, nondistended, nontender. No rebound or guarding. Normal bowel sounds. No appreciable masses or hepatomegaly. Rectal:  Not performed.  Neurologic:  Alert and oriented x4;  grossly normal neurologically.   Skin:   Dry and intact without significant lesions or rashes. Psychiatric: Oriented to person, place and time. Demonstrates good judgement and reason without abnormal affect or behaviors.      Assessment/Plan:   Assessment & Plan Preprocedural evaluation for upper endoscopy Asymptomatic post-melena and hematemesis. Upper endoscopy indicated for potential upper GI bleeding source due to iron deficiency anemia and history of benign gastric polyps. Low risk of procedural complications. - Provided preprocedural education on sedation, procedure duration, and recovery. - Discussed need for post-procedure driver due to sedation. - Reviewed low risk of cardiopulmonary complications, perforation, and bleeding. - Confirmed no bowel preparation needed. - Scheduled upper endoscopy.  Iron deficiency anemia Chronic iron deficiency anemia managed with oral iron. Asymptomatic. Recent melena may be linked to iron therapy, but upper GI bleeding source needs exclusion via endoscopy. - Reviewed history of iron supplementation and anemia. - Discussed possible link between iron therapy and stool discoloration. - Planned upper endoscopy to evaluate GI bleeding source.  Gastroesophageal reflux disease Mild, infrequent GERD symptoms managed with as-needed omeprazole . No dysphagia or severe reflux. Daily therapy reassessment pending endoscopy results for esophagitis or gastric inflammation. - Reviewed current omeprazole  use and symptom frequency. - Discussed potential need for daily therapy if endoscopy shows esophagitis or gastric inflammation. - Planned medication regimen reassessment post-endoscopy.    EGD Follow up per procedure   Camie Furbish, PA-C [facility] 06/07/2024, 9:33 AM  Patient Care Team: Mercer Clotilda SAUNDERS, MD as PCP - General (Family Medicine) Roz Anes, MD as Consulting Physician (Ophthalmology) Cathlyn JAYSON Cary, Bobie BRAVO, MD as Consulting Physician (Obstetrics and  Gynecology)       [1]  Social History Tobacco Use   Smoking status: Never   Smokeless tobacco: Never  Vaping Use   Vaping status: Never Used  Substance Use Topics   Alcohol use: Not Currently   Drug use: No   "

## 2024-06-07 NOTE — Patient Instructions (Addendum)
 You have been scheduled for an endoscopy. Please follow written instructions given to you at your visit today.  If you use inhalers (even only as needed), please bring them with you on the day of your procedure.  If you take any of the following medications, they will need to be adjusted prior to your procedure:   DO NOT TAKE 7 DAYS PRIOR TO TEST- Trulicity (dulaglutide) Ozempic , Wegovy  (semaglutide ) Mounjaro, Zepbound (tirzepatide) Bydureon Bcise (exanatide extended release)  DO NOT TAKE 1 DAY PRIOR TO YOUR TEST Rybelsus  (semaglutide ) Adlyxin (lixisenatide) Victoza (liraglutide) Byetta (exanatide) ___________________________________________________________________________    Thank you for entrusting me with your care and for choosing [employer], Camie Furbish, [place]    _______________________________________________________  If your blood pressure at your visit was 140/90 or greater, please contact your primary care physician to follow up on this.  _______________________________________________________  If you are age 50 or older, your body mass index should be between 23-30. Your Body mass index is 32.91 kg/m. If this is out of the aforementioned range listed, please consider follow up with your Primary Care Provider.  If you are age 50 or younger, your body mass index should be between 19-25. Your Body mass index is 32.91 kg/m. If this is out of the aformentioned range listed, please consider follow up with your Primary Care Provider.   ________________________________________________________  The [facility] providers would like to encourage you to use MYCHART to communicate with providers for non-urgent requests or questions.  Due to long hold times on the telephone, sending your provider a message by [facility] may be a faster and more efficient way to get a response.  Please allow 48 business hours for a response.  Please remember that this is for non-urgent requests.   _______________________________________________________  [facility] is using a team-based approach to care.  Your team is made up of your doctor and two to three APPS. Our APPS (Nurse Practitioners and Physician Assistants) work with your physician to ensure care continuity for you. They are fully qualified to address your health concerns and develop a treatment plan. They communicate directly with your gastroenterologist to care for you. Seeing the Advanced Practice Practitioners on your physician's team can help you by facilitating care more promptly, often allowing for earlier appointments, access to diagnostic testing, procedures, and other specialty referrals.

## 2024-06-10 ENCOUNTER — Encounter: Payer: Self-pay | Admitting: Gastroenterology

## 2024-06-11 NOTE — Progress Notes (Signed)
 ____________________________________________________________  Attending physician addendum:  Thank you for sending this case to me. I have reviewed the entire note and agree with the plan.  She is on my endoscopy schedule for later this week.  Victory Brand, MD  ____________________________________________________________

## 2024-06-14 ENCOUNTER — Ambulatory Visit: Admitting: Gastroenterology

## 2024-06-14 ENCOUNTER — Encounter: Payer: Self-pay | Admitting: Gastroenterology

## 2024-06-14 VITALS — BP 128/78 | HR 98 | Temp 97.5°F | Resp 16 | Ht 66.5 in | Wt 207.0 lb

## 2024-06-14 DIAGNOSIS — K219 Gastro-esophageal reflux disease without esophagitis: Secondary | ICD-10-CM

## 2024-06-14 DIAGNOSIS — K921 Melena: Secondary | ICD-10-CM | POA: Diagnosis not present

## 2024-06-14 DIAGNOSIS — K920 Hematemesis: Secondary | ICD-10-CM | POA: Diagnosis present

## 2024-06-14 DIAGNOSIS — R1013 Epigastric pain: Secondary | ICD-10-CM

## 2024-06-14 DIAGNOSIS — R112 Nausea with vomiting, unspecified: Secondary | ICD-10-CM

## 2024-06-14 MED ORDER — SODIUM CHLORIDE 0.9 % IV SOLN: 500.0000 mL | Freq: Once | INTRAVENOUS | Status: DC

## 2024-06-14 NOTE — Progress Notes (Signed)
 Report given to PACU, vss

## 2024-06-14 NOTE — Op Note (Signed)
 [facility] Patient Name: Linda Conrad Procedure Date: 06/14/2024 9:27 AM MRN: [id number] Endoscopist: Victory L. Legrand , MD, [id number] Age: 49 Referring MD:  Date of Birth: Sep 07, 1975 Gender: Female Account #: [id number] Procedure:                Upper GI endoscopy Indications:              Hematemesis, Nausea with vomiting                           Clinical details in APP office note from 06/07/2024 Medicines:                Monitored Anesthesia Care Procedure:                Pre-Anesthesia Assessment:                           - Prior to the procedure, a History and Physical                            was performed, and patient medications and                            allergies were reviewed. The patient's tolerance of                            previous anesthesia was also reviewed. The risks                            and benefits of the procedure and the sedation                            options and risks were discussed with the patient.                            All questions were answered, and informed consent                            was obtained. Prior Anticoagulants: The patient has                            taken no anticoagulant or antiplatelet agents. ASA                            Grade Assessment: II - A patient with mild systemic                            disease. After reviewing the risks and benefits,                            the patient was deemed in satisfactory condition to                            undergo the procedure.  After obtaining informed consent, the endoscope was                            passed under direct vision. Throughout the                            procedure, the patient's blood pressure, pulse, and                            oxygen saturations were monitored continuously. The                            Olympus Scope SN [id number] was introduced through the                            mouth, and  advanced to the second part of duodenum.                            The upper GI endoscopy was accomplished without                            difficulty. The patient tolerated the procedure                            well. Scope In: Scope Out: Findings:                 The esophagus was normal.                           The stomach was normal.                           The cardia and gastric fundus were normal on                            retroflexion.                           The examined duodenum was normal. Complications:            No immediate complications. Estimated Blood Loss:     Estimated blood loss: none. Impression:               - Normal esophagus.                           - Normal stomach.                           - Normal examined duodenum.                           - No specimens collected.                           The overall clinical picture favors the November  2025 episode having been an acute infectious                            illness that has now resolved. Patient currently                            asymptomatic. Recommendation:           - Patient has a contact number available for                            emergencies. The signs and symptoms of potential                            delayed complications were discussed with the                            patient. Return to normal activities tomorrow.                            Written discharge instructions were provided to the                            patient.                           - Resume previous diet.                           - Continue present medications.                           - Resume ozempic , monitor for recurrent GI symptoms                            and follow up with primary care Grant Henkes L. Legrand, MD 06/14/2024 9:47:35 AM This report has been signed electronically.

## 2024-06-14 NOTE — Progress Notes (Signed)
 No significant changes to clinical history since GI office visit on 06/07/24.  The patient is appropriate for an endoscopic procedure in the ambulatory setting.  - Victory Brand, MD

## 2024-06-14 NOTE — Patient Instructions (Addendum)
 Continue present medications. Resume ozempic , monitor for recurrent GI symptoms and follow up with primary care   YOU HAD AN ENDOSCOPIC PROCEDURE TODAY AT THE  ENDOSCOPY CENTER:   Refer to the procedure report that was given to you for any specific questions about what was found during the examination.  If the procedure report does not answer your questions, please call your gastroenterologist to clarify.  If you requested that your care partner not be given the details of your procedure findings, then the procedure report has been included in a sealed envelope for you to review at your convenience later.  YOU SHOULD EXPECT: Some feelings of bloating in the abdomen. Passage of more gas than usual.  Walking can help get rid of the air that was put into your GI tract during the procedure and reduce the bloating. Please Note:  You might notice some irritation and congestion in your nose or some drainage.  This is from the oxygen used during your procedure.  There is no need for concern and it should clear up in a day or so.  SYMPTOMS TO REPORT IMMEDIATELY: Following upper endoscopy (EGD)  Vomiting of blood or coffee ground material  New chest pain or pain under the shoulder blades  Painful or persistently difficult swallowing  New shortness of breath  Fever of 100F or higher  Black, tarry-looking stools  For urgent or emergent issues, a gastroenterologist can be reached at any hour by calling [phone]. Do not use MyChart messaging for urgent concerns.    DIET:  We do recommend a small meal at first, but then you may proceed to your regular diet.  Drink plenty of fluids but you should avoid alcoholic beverages for 24 hours.  ACTIVITY:  You should plan to take it easy for the rest of today and you should NOT DRIVE or use heavy machinery until tomorrow (because of the sedation medicines used during the test).    FOLLOW UP: Our staff will call the number listed on your records the  next business day following your procedure.  We will call around 7:15- 8:00 am to check on you and address any questions or concerns that you may have regarding the information given to you following your procedure. If we do not reach you, we will leave a message.     SIGNATURES/CONFIDENTIALITY: You and/or your care partner have signed paperwork which will be entered into your electronic medical record.  These signatures attest to the fact that that the information above on your After Visit Summary has been reviewed and is understood.  Full responsibility of the confidentiality of this discharge information lies with you and/or your care-partner.

## 2024-06-14 NOTE — Progress Notes (Signed)
0936 Robinul 0.1 mg IV given due large amount of secretions upon assessment.  MD made aware, vss  ?

## 2024-06-15 ENCOUNTER — Telehealth: Payer: Self-pay

## 2024-06-15 NOTE — Telephone Encounter (Signed)
" °  Follow up Call-     06/14/2024    8:51 AM  Call back number  Post procedure Call Back phone  # [phone]  Permission to leave phone message Yes     Patient questions:  Do you have a fever, pain , or abdominal swelling? No. Pain Score  0 *  Have you tolerated food without any problems? Yes.    Have you been able to return to your normal activities? Yes.    Do you have any questions about your discharge instructions: Diet   No. Medications  No. Follow up visit  No.  Do you have questions or concerns about your Care? No.  Actions: * If pain score is 4 or above: No action needed, pain <4.   "

## 2024-07-03 ENCOUNTER — Telehealth: Payer: Self-pay

## 2024-07-03 NOTE — Telephone Encounter (Signed)
 Copied from CRM [id number]. Topic: Clinical - Prescription Issue >> Jul 02, 2024 12:57 PM Larissa RAMAN wrote: Reason for CRM: Patient requesting a prescription for one touch ultra test strips be sent to:    [facility] [id number] [place], [place] - [address] [place] [address] Phone: [phone] Fax: [phone]

## 2024-07-04 MED ORDER — GLUCOSE BLOOD VI STRP: ORAL_STRIP | 12 refills | Status: AC

## 2024-07-04 NOTE — Addendum Note (Signed)
 Addended by: BRIEN SONG A on: 07/04/2024 11:31 AM   Modules accepted: Orders

## 2024-07-27 ENCOUNTER — Ambulatory Visit: Admitting: Family Medicine

## 2024-11-20 ENCOUNTER — Ambulatory Visit: Admitting: Obstetrics and Gynecology
